# Patient Record
Sex: Female | Born: 1945
Health system: Southern US, Community
[De-identification: ages and names within clinical notes are randomized; demographics above are authoritative.]

## PROBLEM LIST (undated history)

## (undated) DIAGNOSIS — I10 Essential (primary) hypertension: Secondary | ICD-10-CM

## (undated) DIAGNOSIS — M1990 Unspecified osteoarthritis, unspecified site: Secondary | ICD-10-CM

## (undated) DIAGNOSIS — I4891 Unspecified atrial fibrillation: Secondary | ICD-10-CM

## (undated) DIAGNOSIS — E119 Type 2 diabetes mellitus without complications: Secondary | ICD-10-CM

## (undated) DIAGNOSIS — Z7901 Long term (current) use of anticoagulants: Secondary | ICD-10-CM

## (undated) HISTORY — PX: CATARACT EXTRACTION W/ INTRAOCULAR LENS  IMPLANT, BILATERAL: SHX1307

---

## 1998-11-29 ENCOUNTER — Other Ambulatory Visit: Admission: RE | Admit: 1998-11-29 | Discharge: 1998-11-29 | Payer: Self-pay | Admitting: Obstetrics & Gynecology

## 1999-12-05 ENCOUNTER — Other Ambulatory Visit: Admission: RE | Admit: 1999-12-05 | Discharge: 1999-12-05 | Payer: Self-pay | Admitting: Obstetrics & Gynecology

## 2000-06-01 ENCOUNTER — Encounter: Payer: Self-pay | Admitting: Obstetrics & Gynecology

## 2000-06-01 ENCOUNTER — Encounter: Admission: RE | Admit: 2000-06-01 | Discharge: 2000-06-01 | Payer: Self-pay | Admitting: Obstetrics & Gynecology

## 2001-02-15 ENCOUNTER — Other Ambulatory Visit: Admission: RE | Admit: 2001-02-15 | Discharge: 2001-02-15 | Payer: Self-pay | Admitting: Obstetrics & Gynecology

## 2001-12-07 ENCOUNTER — Encounter: Payer: Self-pay | Admitting: Obstetrics & Gynecology

## 2001-12-07 ENCOUNTER — Encounter: Admission: RE | Admit: 2001-12-07 | Discharge: 2001-12-07 | Payer: Self-pay | Admitting: Obstetrics & Gynecology

## 2002-02-21 ENCOUNTER — Other Ambulatory Visit: Admission: RE | Admit: 2002-02-21 | Discharge: 2002-02-21 | Payer: Self-pay | Admitting: Obstetrics & Gynecology

## 2002-12-19 ENCOUNTER — Encounter: Admission: RE | Admit: 2002-12-19 | Discharge: 2002-12-19 | Payer: Self-pay | Admitting: Obstetrics & Gynecology

## 2002-12-19 ENCOUNTER — Encounter: Payer: Self-pay | Admitting: Obstetrics & Gynecology

## 2003-11-27 ENCOUNTER — Other Ambulatory Visit: Admission: RE | Admit: 2003-11-27 | Discharge: 2003-11-27 | Payer: Self-pay | Admitting: Obstetrics & Gynecology

## 2004-01-12 ENCOUNTER — Ambulatory Visit (HOSPITAL_COMMUNITY): Admission: RE | Admit: 2004-01-12 | Discharge: 2004-01-12 | Payer: Self-pay | Admitting: Obstetrics & Gynecology

## 2005-01-17 ENCOUNTER — Ambulatory Visit (HOSPITAL_COMMUNITY): Admission: RE | Admit: 2005-01-17 | Discharge: 2005-01-17 | Payer: Self-pay | Admitting: Obstetrics & Gynecology

## 2005-01-17 ENCOUNTER — Other Ambulatory Visit: Admission: RE | Admit: 2005-01-17 | Discharge: 2005-01-17 | Payer: Self-pay | Admitting: Obstetrics & Gynecology

## 2006-02-06 ENCOUNTER — Ambulatory Visit (HOSPITAL_COMMUNITY): Admission: RE | Admit: 2006-02-06 | Discharge: 2006-02-06 | Payer: Self-pay | Admitting: Obstetrics & Gynecology

## 2007-03-05 ENCOUNTER — Ambulatory Visit (HOSPITAL_COMMUNITY): Admission: RE | Admit: 2007-03-05 | Discharge: 2007-03-05 | Payer: Self-pay | Admitting: Obstetrics & Gynecology

## 2008-03-21 ENCOUNTER — Ambulatory Visit (HOSPITAL_COMMUNITY): Admission: RE | Admit: 2008-03-21 | Discharge: 2008-03-21 | Payer: Self-pay | Admitting: Obstetrics & Gynecology

## 2009-03-23 ENCOUNTER — Ambulatory Visit (HOSPITAL_COMMUNITY): Admission: RE | Admit: 2009-03-23 | Discharge: 2009-03-23 | Payer: Self-pay | Admitting: Obstetrics & Gynecology

## 2010-04-01 ENCOUNTER — Ambulatory Visit (HOSPITAL_COMMUNITY): Admission: RE | Admit: 2010-04-01 | Discharge: 2010-04-01 | Payer: Self-pay | Admitting: Endocrinology

## 2010-04-12 ENCOUNTER — Encounter: Admission: RE | Admit: 2010-04-12 | Discharge: 2010-04-12 | Payer: Self-pay | Admitting: Endocrinology

## 2010-04-15 ENCOUNTER — Encounter: Admission: RE | Admit: 2010-04-15 | Discharge: 2010-04-15 | Payer: Self-pay | Admitting: Endocrinology

## 2010-10-08 ENCOUNTER — Encounter: Admission: RE | Admit: 2010-10-08 | Discharge: 2010-10-08 | Payer: Self-pay | Admitting: Endocrinology

## 2011-03-17 ENCOUNTER — Other Ambulatory Visit: Payer: Self-pay | Admitting: Endocrinology

## 2011-03-17 DIAGNOSIS — Z09 Encounter for follow-up examination after completed treatment for conditions other than malignant neoplasm: Secondary | ICD-10-CM

## 2011-05-20 ENCOUNTER — Ambulatory Visit
Admission: RE | Admit: 2011-05-20 | Discharge: 2011-05-20 | Disposition: A | Discharge status: Home / Self Care | Payer: BC Managed Care – PPO | Source: Ambulatory Visit | Attending: Endocrinology | Admitting: Endocrinology

## 2011-05-20 DIAGNOSIS — Z09 Encounter for follow-up examination after completed treatment for conditions other than malignant neoplasm: Secondary | ICD-10-CM

## 2012-04-20 ENCOUNTER — Other Ambulatory Visit: Payer: Self-pay | Admitting: Endocrinology

## 2012-04-20 DIAGNOSIS — Z1231 Encounter for screening mammogram for malignant neoplasm of breast: Secondary | ICD-10-CM

## 2012-06-11 ENCOUNTER — Ambulatory Visit
Admission: RE | Admit: 2012-06-11 | Discharge: 2012-06-11 | Disposition: A | Discharge status: Home / Self Care | Payer: BC Managed Care – PPO | Source: Ambulatory Visit | Attending: Endocrinology | Admitting: Endocrinology

## 2012-06-11 DIAGNOSIS — Z1231 Encounter for screening mammogram for malignant neoplasm of breast: Secondary | ICD-10-CM

## 2013-01-19 ENCOUNTER — Ambulatory Visit (INDEPENDENT_AMBULATORY_CARE_PROVIDER_SITE_OTHER): Payer: BC Managed Care – PPO | Admitting: Surgery

## 2013-01-20 ENCOUNTER — Ambulatory Visit (INDEPENDENT_AMBULATORY_CARE_PROVIDER_SITE_OTHER): Payer: Self-pay | Admitting: General Surgery

## 2013-01-21 ENCOUNTER — Telehealth (INDEPENDENT_AMBULATORY_CARE_PROVIDER_SITE_OTHER): Payer: Self-pay

## 2013-01-21 NOTE — Telephone Encounter (Signed)
Called pt to let her know of her new appt date and time: Monday, March 3rd @ 150p.  Pt stated that she called our office on Wed, Feb 12 and canceled her appt with [facility], and that she had scheduled with another office.  NONE OF THIS WAS NOTED IN HER CHART.

## 2013-01-21 NOTE — Telephone Encounter (Signed)
Spoke with pt about her r/s'd appt date and time: Tuesday March 4th @310p 

## 2013-01-24 ENCOUNTER — Encounter (INDEPENDENT_AMBULATORY_CARE_PROVIDER_SITE_OTHER): Payer: Self-pay | Admitting: General Surgery

## 2013-02-07 ENCOUNTER — Ambulatory Visit (INDEPENDENT_AMBULATORY_CARE_PROVIDER_SITE_OTHER): Payer: BC Managed Care – PPO | Admitting: Surgery

## 2013-06-22 ENCOUNTER — Other Ambulatory Visit: Payer: Self-pay

## 2013-06-22 DIAGNOSIS — Z1231 Encounter for screening mammogram for malignant neoplasm of breast: Secondary | ICD-10-CM

## 2013-06-26 ENCOUNTER — Other Ambulatory Visit: Payer: Self-pay | Admitting: Orthopedic Surgery

## 2013-06-26 MED ORDER — BUPIVACAINE LIPOSOME 1.3 % IJ SUSP: 20.0000 mL | Freq: Once | INTRAMUSCULAR | Status: DC

## 2013-06-26 MED ORDER — DEXAMETHASONE SODIUM PHOSPHATE 10 MG/ML IJ SOLN: 10.0000 mg | Freq: Once | INTRAMUSCULAR | Status: DC

## 2013-06-26 NOTE — Progress Notes (Signed)
Preoperative surgical orders have been place into the [facility] hospital system for Kelly [name] on 06/26/2013, 10:26 AM  by Patrica Duel for surgery on 07/07/13.  Preop Total Hip orders including Experel Injecion, PO Tylenol, and IV Decadron as long as there are no contraindications to the above medications. Avel Peace, PA-C

## 2013-06-27 ENCOUNTER — Encounter (HOSPITAL_COMMUNITY): Payer: Self-pay | Admitting: Pharmacy Technician

## 2013-06-29 NOTE — Patient Instructions (Signed)
Kelly [name]  06/29/2013   Your procedure is scheduled on:  07/07/13                Surgery 315pm-515pm  Report to [facility] at    1245pm.  Call this number if you have problems the morning of surgery: [phone]   Remember:   Do not eat food after midnite.              May have clear liquids until 0830am then npo.    Take these medicines the morning of surgery with A SIP OF WATER:    Do not wear jewelry, make-up or nail polish.  Do not wear lotions, powders, or perfumes  Do not shave 48 hours prior to surgery  Do not bring valuables to the hospital.  Contacts, dentures or bridgework may not be worn into surgery.  Leave suitcase in the car. After surgery it may be brought to your room.  For patients admitted to the hospital, checkout time is 11:00 AM the day of  discharge.   SEE CHG INSTRUCTION SHEET    Please read over the following fact sheets that you were given: MRSA Information, coughing and deep breathing exercises, leg exercises, Blood Transfusion Fact sheet, Incentive Spirometry Fact Sheet                Failure to comply with these instructions may result in cancellation of your surgery.                Patient Signature ____________________________              Nurse Signature _____________________________

## 2013-06-30 ENCOUNTER — Other Ambulatory Visit: Payer: Self-pay | Admitting: Orthopedic Surgery

## 2013-06-30 ENCOUNTER — Ambulatory Visit (HOSPITAL_COMMUNITY)
Admission: RE | Admit: 2013-06-30 | Discharge: 2013-06-30 | Disposition: A | Discharge status: Home / Self Care | Payer: BC Managed Care – PPO | Source: Ambulatory Visit | Attending: Orthopedic Surgery | Admitting: Orthopedic Surgery

## 2013-06-30 ENCOUNTER — Encounter (HOSPITAL_COMMUNITY): Payer: Self-pay

## 2013-06-30 ENCOUNTER — Encounter (HOSPITAL_COMMUNITY)
Admission: RE | Admit: 2013-06-30 | Discharge: 2013-06-30 | Disposition: A | Discharge status: Home / Self Care | Payer: BC Managed Care – PPO | Source: Ambulatory Visit | Attending: Orthopedic Surgery | Admitting: Orthopedic Surgery

## 2013-06-30 DIAGNOSIS — M1990 Unspecified osteoarthritis, unspecified site: Secondary | ICD-10-CM | POA: Insufficient documentation

## 2013-06-30 DIAGNOSIS — Z01812 Encounter for preprocedural laboratory examination: Secondary | ICD-10-CM | POA: Insufficient documentation

## 2013-06-30 DIAGNOSIS — I10 Essential (primary) hypertension: Secondary | ICD-10-CM | POA: Insufficient documentation

## 2013-06-30 DIAGNOSIS — Z01818 Encounter for other preprocedural examination: Secondary | ICD-10-CM | POA: Insufficient documentation

## 2013-06-30 DIAGNOSIS — Z0183 Encounter for blood typing: Secondary | ICD-10-CM | POA: Insufficient documentation

## 2013-06-30 DIAGNOSIS — E119 Type 2 diabetes mellitus without complications: Secondary | ICD-10-CM | POA: Insufficient documentation

## 2013-06-30 HISTORY — DX: Unspecified osteoarthritis, unspecified site: M19.90

## 2013-06-30 LAB — URINALYSIS, ROUTINE W REFLEX MICROSCOPIC
Bilirubin Urine: NEGATIVE
Hgb urine dipstick: NEGATIVE
Protein, ur: NEGATIVE mg/dL
Urobilinogen, UA: 1 mg/dL (ref 0.0–1.0)

## 2013-06-30 LAB — ABO/RH: ABO/RH(D): O POS

## 2013-06-30 MED ORDER — DEXAMETHASONE SODIUM PHOSPHATE 10 MG/ML IJ SOLN: 10.0000 mg | Freq: Once | INTRAMUSCULAR | Status: DC

## 2013-06-30 NOTE — Progress Notes (Signed)
EKG 06/16/13 on chart  CBC, CMP, PT done 06/16/13 on chart  Office visit note 06/21/13 on chart from Dr Evlyn Kanner

## 2013-07-04 ENCOUNTER — Ambulatory Visit
Admission: RE | Admit: 2013-07-04 | Discharge: 2013-07-04 | Disposition: A | Discharge status: Home / Self Care | Payer: BC Managed Care – PPO | Source: Ambulatory Visit

## 2013-07-04 DIAGNOSIS — Z1231 Encounter for screening mammogram for malignant neoplasm of breast: Secondary | ICD-10-CM

## 2013-07-06 ENCOUNTER — Other Ambulatory Visit: Payer: Self-pay | Admitting: Orthopedic Surgery

## 2013-07-06 NOTE — H&P (Signed)
Kelly [name]  DOB: 10/29/1946 Married / Language: English / Race: White Female  Date of Admission:  07/07/2013  Chief Complaint:  Right Hip Pain  History of Present Illness The patient is a 67 year old female who comes in for a preoperative History and Physical. The patient is scheduled for a right total hip arthroplasty to be performed by Dr. Frank V. Aluisio, MD at [facility] on 07-07-13. The patient is a 67 year old female who presents with a hip problem. The patient was seen for a second opinion.The patient reports right hip, right lateral hip and right posterior hip problems including pain, popping and snapping symptoms that have been present for 4 year(s). The symptoms began without any known injury. Symptoms reported include hip pain and popping The patient reports symptoms radiating to the: right thigh. The patient describes the hip problem as sharp, dull, aching, throbbing, grinding, stabbing and shooting. Onset of symptoms was gradual.The symptoms are described as moderate in severity.The patient feels as if their symptoms are does feel they are worsening. Symptoms are relieved by nonsteroidal anti-inflammatory drugs. Current treatment includes nonsteroidal anti-inflammatory drugs. Unfortunately her hip has gotten progressively worse over time. When it began four years ago she noted pain in her buttock area when she was down in a shopping mall in [place]. She had a hard time standing after that and had some dull pain intermittently for a few years after that. Unfortunately since February of this year she has had a marked increase in pain and dysfunction. She had been seen over at [facility] by Dr. Newton and was felt that problem initially was coming from her back. Dr. Lucey then saw her and noted that the problem was coming from the hip. She has had intra-articular hip injection which provided her a short term benefit. She feels as though the hip is  definitely progressing and is definitely limiting what she can and can not do. She is at a stage where she is ready to get this hip fixed. Her left hip does not hurt. There is no family history of hip arthritis. They have been treated conservatively in the past for the above stated problem and despite conservative measures, they continue to have progressive pain and severe functional limitations and dysfunction. They have failed non-operative management including home exercise, medications, and injections. It is felt that they would benefit from undergoing total joint replacement. Risks and benefits of the procedure have been discussed with the patient and they elect to proceed with surgery. There are no active contraindications to surgery such as ongoing infection or rapidly progressive neurological disease.   Allergies No Known Drug Allergies. 05/25/2013   Family History First Degree Relatives. reported Diabetes Mellitus. grandmother fathers side Chronic Obstructive Lung Disease. mother   Social History Never smoker Marital status. married Living situation. live with spouse Tobacco use. never smoker Pain Contract. no Number of flights of stairs before winded. 2-3 Current work status. working full time Children. 2 Alcohol use. never consumed alcohol Illicit drug use. no Drug/Alcohol Rehab (Previously). no Drug/Alcohol Rehab (Currently). no Post-Surgical Plans. Home   Medication History Byetta 10 MCG Pen (10MCG/0.04ML Solution, Subcutaneous) Active. Ramipril (10MG Capsule, Oral) Active. Triamcinolone Acetonide (0.1% Cream, External) Active. Vitamin D (Ergocalciferol) (50000UNIT Capsule, Oral) Active. Aspirin (81MG Tablet, 1 (one) Oral) Active. MetFORMIN HCl (850MG Tablet, Oral) Active. (15-850mg) Simvastatin (40MG Tablet, Oral) Active. Vitamin D (400UNIT Capsule, 1 (one) Oral) Active. (50000 iu) I CAPS AREDS FORMULA Active. Turmeric Curcumin (500MG   Capsule,  Oral) Active.   Past Surgical History No pertinent past surgical history   Medical History Hypercholesterolemia Osteoarthritis Osteoporosis Diabetes Mellitus, Type II Heart murmur High blood pressure Cholelithiasis. Asymptomatic   Review of Systems General:Not Present- Chills, Fever, Night Sweats, Fatigue, Weight Gain, Weight Loss and Memory Loss. Skin:Not Present- Hives, Itching, Rash, Eczema and Lesions. HEENT:Not Present- Tinnitus, Headache, Double Vision, Visual Loss, Hearing Loss and Dentures. Respiratory:Not Present- Shortness of breath with exertion, Shortness of breath at rest, Allergies, Coughing up blood and Chronic Cough. Cardiovascular:Not Present- Chest Pain, Racing/skipping heartbeats, Difficulty Breathing Lying Down, Murmur, Swelling and Palpitations. Gastrointestinal:Not Present- Bloody Stool, Heartburn, Abdominal Pain, Vomiting, Nausea, Constipation, Diarrhea, Difficulty Swallowing, Jaundice and Loss of appetitie. Female Genitourinary:Not Present- Blood in Urine, Urinary frequency, Weak urinary stream, Discharge, Flank Pain, Incontinence, Painful Urination, Urgency, Urinary Retention and Urinating at Night. Musculoskeletal:Present- Joint Pain. Not Present- Muscle Weakness, Muscle Pain, Joint Swelling, Back Pain, Morning Stiffness and Spasms. Neurological:Not Present- Tremor, Dizziness, Blackout spells, Paralysis, Difficulty with balance and Weakness. Psychiatric:Not Present- Insomnia.   Vitals Weight: 258 lb Height: 61.5 in Body Surface Area: 2.25 m Body Mass Index: 47.96 kg/m Pulse: 64 (Regular) Resp.: 14 (Unlabored) BP: 138/80 (Sitting, Left Arm, Standard)    Physical Exam The physical exam findings are as follows:  Note: Patient is a 67 year old female with continued hip pain. Patient is accompanied today by her husband.   General Mental Status - Alert, cooperative and good historian. General Appearance- pleasant.  Not in acute distress. Orientation- Oriented X3. Build & Nutrition- Well nourished and Well developed.   Head and Neck Head- normocephalic, atraumatic . Neck Global Assessment- bruit auscultated on the left and supple. no bruit auscultated on the right. Carotid Arteries- Left- bruit (faint).   Eye Pupil- Bilateral- Regular and Round. Motion- Bilateral- EOMI.   Chest and Lung Exam Auscultation: Breath sounds:- clear at anterior chest wall and - clear at posterior chest wall. Adventitious sounds:- No Adventitious sounds.   Cardiovascular Auscultation:Rhythm- Regular rate and rhythm. Heart Sounds- S1 WNL and S2 WNL. Murmurs & Other Heart Sounds: Murmur 1:Location- Aortic Area and Pulmonic Area. Timing- Holosystolic. Grade- II/VI. Character- Holosystolic.   Abdomen Inspection:Contour- Generalized moderate distention. Palpation/Percussion:Tenderness- Abdomen is non-tender to palpation. Rigidity (guarding)- Abdomen is soft. Auscultation:Auscultation of the abdomen reveals - Bowel sounds normal.   Female Genitourinary Not done, not pertinent to present illness  Musculoskeletal Well developed moderately obese female, most of her obesity is around the trunk and hips. Her left hip shows normal range of motion with no discomfort. Right hip flexion to about 90, no internal or external rotation, no abduction or adduction. She is slightly tender over the right greater trochanter. Her knee exam is normal. Pulses, sensation and motor are intact. That leg is about 1/2 inch short compared to the left lower extremity.  RADIOGRAPHS: AP pelvis and lateral of the right hip show that she has severe endstage hip arthritis with significant erosion of the femoral head and superior acetabulum. The hip is riding about 1/2 inch high compared to the left hip which looks normal.  Assessment & Plan Osteoarthritis, Hip (715.35) Impression: Right Hip  Note: Plan is for a  Right Total Hip Replacement by Dr. Aluisio.  Plan is to go home following the hospital stay.  PCP - Dr. Stephen South - Patient has been seen preoperatively and felt to be stable for surgery.  The patient does not have any contraindications and will recieve TXA (tranexamic acid) prior to surgery.    Signed electronically by Alexzandrew L Perkins, III PA-C  

## 2013-07-07 ENCOUNTER — Encounter (HOSPITAL_COMMUNITY): Payer: Self-pay | Admitting: *Deleted

## 2013-07-07 ENCOUNTER — Inpatient Hospital Stay (HOSPITAL_COMMUNITY): Payer: BC Managed Care – PPO | Admitting: Certified Registered Nurse Anesthetist

## 2013-07-07 ENCOUNTER — Inpatient Hospital Stay (HOSPITAL_COMMUNITY)
Admission: RE | Admit: 2013-07-07 | Discharge: 2013-07-09 | DRG: 818 | Disposition: A | Discharge status: Home Health | Payer: BC Managed Care – PPO | Source: Ambulatory Visit | Attending: Orthopedic Surgery | Admitting: Orthopedic Surgery

## 2013-07-07 ENCOUNTER — Encounter (HOSPITAL_COMMUNITY): Payer: Self-pay | Admitting: Certified Registered Nurse Anesthetist

## 2013-07-07 ENCOUNTER — Inpatient Hospital Stay (HOSPITAL_COMMUNITY): Payer: BC Managed Care – PPO

## 2013-07-07 ENCOUNTER — Encounter (HOSPITAL_COMMUNITY)
Admission: RE | Disposition: A | Discharge status: Home Health | Payer: Self-pay | Source: Ambulatory Visit | Attending: Orthopedic Surgery

## 2013-07-07 DIAGNOSIS — E119 Type 2 diabetes mellitus without complications: Secondary | ICD-10-CM | POA: Diagnosis present

## 2013-07-07 DIAGNOSIS — M169 Osteoarthritis of hip, unspecified: Secondary | ICD-10-CM

## 2013-07-07 DIAGNOSIS — M810 Age-related osteoporosis without current pathological fracture: Secondary | ICD-10-CM | POA: Diagnosis present

## 2013-07-07 DIAGNOSIS — E7800 Pure hypercholesterolemia, unspecified: Secondary | ICD-10-CM | POA: Diagnosis present

## 2013-07-07 DIAGNOSIS — I10 Essential (primary) hypertension: Secondary | ICD-10-CM | POA: Diagnosis present

## 2013-07-07 DIAGNOSIS — Z96649 Presence of unspecified artificial hip joint: Secondary | ICD-10-CM

## 2013-07-07 DIAGNOSIS — M1610 Unilateral primary osteoarthritis, unspecified hip: Principal | ICD-10-CM | POA: Diagnosis present

## 2013-07-07 DIAGNOSIS — Z6841 Body Mass Index (BMI) 40.0 and over, adult: Secondary | ICD-10-CM

## 2013-07-07 HISTORY — PX: TOTAL HIP ARTHROPLASTY: SHX124

## 2013-07-07 LAB — GLUCOSE, CAPILLARY: Glucose-Capillary: 100 mg/dL — ABNORMAL HIGH (ref 70–99)

## 2013-07-07 LAB — TYPE AND SCREEN
ABO/RH(D): O POS
Antibody Screen: NEGATIVE

## 2013-07-07 SURGERY — ARTHROPLASTY, HIP, TOTAL,POSTERIOR APPROACH
Anesthesia: Spinal | Site: Hip | Laterality: Right | Wound class: Clean

## 2013-07-07 MED ORDER — LIDOCAINE HCL (CARDIAC) 20 MG/ML IV SOLN
INTRAVENOUS | Status: DC | PRN
  Administered 2013-07-07: 100 mg via INTRAVENOUS

## 2013-07-07 MED ORDER — LACTATED RINGERS IV SOLN
INTRAVENOUS | Status: DC
  Administered 2013-07-07: 1000 mL via INTRAVENOUS

## 2013-07-07 MED ORDER — ONDANSETRON HCL 4 MG/2ML IJ SOLN
4.0000 mg | Freq: Four times a day (QID) | INTRAMUSCULAR | Status: DC | PRN
  Administered 2013-07-08: 4 mg via INTRAVENOUS
  Filled 2013-07-07: qty 2

## 2013-07-07 MED ORDER — MORPHINE SULFATE 2 MG/ML IJ SOLN
1.0000 mg | INTRAMUSCULAR | Status: DC | PRN
  Administered 2013-07-07 – 2013-07-08 (×6): 2 mg via INTRAVENOUS
  Filled 2013-07-07 (×6): qty 1

## 2013-07-07 MED ORDER — CEFAZOLIN SODIUM-DEXTROSE 2-3 GM-% IV SOLR
2.0000 g | INTRAVENOUS | Status: AC
  Administered 2013-07-07: 2 g via INTRAVENOUS

## 2013-07-07 MED ORDER — SODIUM CHLORIDE 0.9 % IJ SOLN
INTRAMUSCULAR | Status: DC | PRN
  Administered 2013-07-07: 50 mL

## 2013-07-07 MED ORDER — FLEET ENEMA 7-19 GM/118ML RE ENEM: 1.0000 | ENEMA | Freq: Once | RECTAL | Status: AC | PRN

## 2013-07-07 MED ORDER — HYDROMORPHONE HCL PF 1 MG/ML IJ SOLN
0.2500 mg | INTRAMUSCULAR | Status: DC | PRN
  Administered 2013-07-07 (×2): 0.5 mg via INTRAVENOUS

## 2013-07-07 MED ORDER — INSULIN ASPART 100 UNIT/ML ~~LOC~~ SOLN
0.0000 [IU] | Freq: Three times a day (TID) | SUBCUTANEOUS | Status: DC
  Administered 2013-07-08: 3 [IU] via SUBCUTANEOUS
  Administered 2013-07-08 – 2013-07-09 (×3): 2 [IU] via SUBCUTANEOUS

## 2013-07-07 MED ORDER — SODIUM CHLORIDE 0.9 % IV SOLN
INTRAVENOUS | Status: DC
  Administered 2013-07-07: 22:00:00 via INTRAVENOUS

## 2013-07-07 MED ORDER — BISACODYL 10 MG RE SUPP: 10.0000 mg | Freq: Every day | RECTAL | Status: DC | PRN

## 2013-07-07 MED ORDER — ACETAMINOPHEN 500 MG PO TABS
1000.0000 mg | ORAL_TABLET | Freq: Four times a day (QID) | ORAL | Status: AC
  Administered 2013-07-07 – 2013-07-08 (×4): 1000 mg via ORAL
  Filled 2013-07-07 (×4): qty 2

## 2013-07-07 MED ORDER — BUPIVACAINE HCL (PF) 0.5 % IJ SOLN
INTRAMUSCULAR | Status: DC | PRN
  Administered 2013-07-07: 3 mL

## 2013-07-07 MED ORDER — PHENOL 1.4 % MT LIQD: 1.0000 | OROMUCOSAL | Status: DC | PRN

## 2013-07-07 MED ORDER — BUPIVACAINE LIPOSOME 1.3 % IJ SUSP
20.0000 mL | Freq: Once | INTRAMUSCULAR | Status: DC
  Filled 2013-07-07: qty 20

## 2013-07-07 MED ORDER — FENTANYL CITRATE 0.05 MG/ML IJ SOLN: 25.0000 ug | INTRAMUSCULAR | Status: DC | PRN

## 2013-07-07 MED ORDER — PIOGLITAZONE HCL-METFORMIN HCL 15-850 MG PO TABS: 1.0000 | ORAL_TABLET | Freq: Every evening | ORAL | Status: DC

## 2013-07-07 MED ORDER — MEPERIDINE HCL 50 MG/ML IJ SOLN
6.2500 mg | INTRAMUSCULAR | Status: DC | PRN
  Administered 2013-07-07 (×2): 6.25 mg via INTRAVENOUS

## 2013-07-07 MED ORDER — SODIUM CHLORIDE 0.9 % IR SOLN
Status: DC | PRN
  Administered 2013-07-07: 1000 mL

## 2013-07-07 MED ORDER — METHOCARBAMOL 100 MG/ML IJ SOLN
500.0000 mg | Freq: Four times a day (QID) | INTRAVENOUS | Status: DC | PRN
  Administered 2013-07-07: 500 mg via INTRAVENOUS
  Filled 2013-07-07: qty 5

## 2013-07-07 MED ORDER — SIMVASTATIN 40 MG PO TABS
40.0000 mg | ORAL_TABLET | Freq: Every evening | ORAL | Status: DC
  Administered 2013-07-07 – 2013-07-08 (×2): 40 mg via ORAL
  Filled 2013-07-07 (×3): qty 1

## 2013-07-07 MED ORDER — TRAMADOL HCL 50 MG PO TABS
50.0000 mg | ORAL_TABLET | Freq: Four times a day (QID) | ORAL | Status: DC | PRN
  Administered 2013-07-08: 100 mg via ORAL
  Filled 2013-07-07 (×2): qty 2

## 2013-07-07 MED ORDER — PROMETHAZINE HCL 25 MG/ML IJ SOLN: 6.2500 mg | INTRAMUSCULAR | Status: DC | PRN

## 2013-07-07 MED ORDER — OXYCODONE HCL 5 MG PO TABS
5.0000 mg | ORAL_TABLET | ORAL | Status: DC | PRN
Start: 2013-07-07 — End: 2013-07-09
  Administered 2013-07-07 (×2): 5 mg via ORAL
  Administered 2013-07-08 (×5): 10 mg via ORAL
  Administered 2013-07-09: 5 mg via ORAL
  Filled 2013-07-07 (×2): qty 1
  Filled 2013-07-07 (×6): qty 2
  Filled 2013-07-07: qty 1

## 2013-07-07 MED ORDER — ONDANSETRON HCL 4 MG PO TABS
4.0000 mg | ORAL_TABLET | Freq: Four times a day (QID) | ORAL | Status: DC | PRN
  Administered 2013-07-09: 4 mg via ORAL
  Filled 2013-07-07: qty 1

## 2013-07-07 MED ORDER — ACETAMINOPHEN 500 MG PO TABS
1000.0000 mg | ORAL_TABLET | Freq: Once | ORAL | Status: AC
  Administered 2013-07-07: 1000 mg via ORAL
  Filled 2013-07-07: qty 2

## 2013-07-07 MED ORDER — PROPOFOL INFUSION 10 MG/ML OPTIME
INTRAVENOUS | Status: DC | PRN
  Administered 2013-07-07: 160 ug/kg/min via INTRAVENOUS

## 2013-07-07 MED ORDER — PHENYLEPHRINE HCL 10 MG/ML IJ SOLN
INTRAMUSCULAR | Status: DC | PRN
  Administered 2013-07-07: 80 ug via INTRAVENOUS
  Administered 2013-07-07: 40 ug via INTRAVENOUS

## 2013-07-07 MED ORDER — MIDAZOLAM HCL 5 MG/5ML IJ SOLN
INTRAMUSCULAR | Status: DC | PRN
  Administered 2013-07-07 (×2): 2 mg via INTRAVENOUS

## 2013-07-07 MED ORDER — METOCLOPRAMIDE HCL 5 MG/ML IJ SOLN: 5.0000 mg | Freq: Three times a day (TID) | INTRAMUSCULAR | Status: DC | PRN

## 2013-07-07 MED ORDER — POLYETHYLENE GLYCOL 3350 17 G PO PACK: 17.0000 g | PACK | Freq: Every day | ORAL | Status: DC | PRN

## 2013-07-07 MED ORDER — DEXAMETHASONE SODIUM PHOSPHATE 10 MG/ML IJ SOLN: 10.0000 mg | Freq: Once | INTRAMUSCULAR | Status: DC

## 2013-07-07 MED ORDER — ONDANSETRON HCL 4 MG/2ML IJ SOLN
INTRAMUSCULAR | Status: DC | PRN
  Administered 2013-07-07: 4 mg via INTRAVENOUS

## 2013-07-07 MED ORDER — METHOCARBAMOL 500 MG PO TABS
500.0000 mg | ORAL_TABLET | Freq: Four times a day (QID) | ORAL | Status: DC | PRN
  Administered 2013-07-08: 500 mg via ORAL
  Filled 2013-07-07: qty 1

## 2013-07-07 MED ORDER — CEFAZOLIN SODIUM-DEXTROSE 2-3 GM-% IV SOLR
2.0000 g | Freq: Four times a day (QID) | INTRAVENOUS | Status: AC
  Administered 2013-07-07 – 2013-07-08 (×2): 2 g via INTRAVENOUS
  Filled 2013-07-07 (×2): qty 50

## 2013-07-07 MED ORDER — BUPIVACAINE HCL (PF) 0.25 % IJ SOLN
INTRAMUSCULAR | Status: DC | PRN
  Administered 2013-07-07: 20 mL

## 2013-07-07 MED ORDER — BUPIVACAINE LIPOSOME 1.3 % IJ SUSP
INTRAMUSCULAR | Status: DC | PRN
  Administered 2013-07-07: 20 mL

## 2013-07-07 MED ORDER — MENTHOL 3 MG MT LOZG: 1.0000 | LOZENGE | OROMUCOSAL | Status: DC | PRN

## 2013-07-07 MED ORDER — EXENATIDE 10 MCG/0.04ML ~~LOC~~ SOPN
10.0000 ug | PEN_INJECTOR | Freq: Every day | SUBCUTANEOUS | Status: DC
  Administered 2013-07-08 – 2013-07-09 (×2): 10 ug via SUBCUTANEOUS

## 2013-07-07 MED ORDER — TRANEXAMIC ACID 100 MG/ML IV SOLN
1000.0000 mg | INTRAVENOUS | Status: AC
  Administered 2013-07-07: 1000 mg via INTRAVENOUS
  Filled 2013-07-07: qty 10

## 2013-07-07 MED ORDER — DIPHENHYDRAMINE HCL 12.5 MG/5ML PO ELIX: 12.5000 mg | ORAL_SOLUTION | ORAL | Status: DC | PRN

## 2013-07-07 MED ORDER — SODIUM CHLORIDE 0.9 % IV SOLN: INTRAVENOUS | Status: DC

## 2013-07-07 MED ORDER — METOCLOPRAMIDE HCL 10 MG PO TABS: 5.0000 mg | ORAL_TABLET | Freq: Three times a day (TID) | ORAL | Status: DC | PRN

## 2013-07-07 MED ORDER — RIVAROXABAN 10 MG PO TABS
10.0000 mg | ORAL_TABLET | Freq: Every day | ORAL | Status: DC
  Administered 2013-07-08 – 2013-07-09 (×2): 10 mg via ORAL
  Filled 2013-07-07 (×3): qty 1

## 2013-07-07 MED ORDER — RAMIPRIL 10 MG PO CAPS
20.0000 mg | ORAL_CAPSULE | Freq: Every evening | ORAL | Status: DC
  Administered 2013-07-07 – 2013-07-08 (×2): 20 mg via ORAL
  Filled 2013-07-07 (×3): qty 2

## 2013-07-07 MED ORDER — DOCUSATE SODIUM 100 MG PO CAPS
100.0000 mg | ORAL_CAPSULE | Freq: Two times a day (BID) | ORAL | Status: DC
  Administered 2013-07-07 – 2013-07-09 (×3): 100 mg via ORAL

## 2013-07-07 MED ORDER — CHLORHEXIDINE GLUCONATE 4 % EX LIQD
60.0000 mL | Freq: Once | CUTANEOUS | Status: DC
  Filled 2013-07-07: qty 60

## 2013-07-07 MED ORDER — PIOGLITAZONE HCL 15 MG PO TABS
15.0000 mg | ORAL_TABLET | Freq: Every day | ORAL | Status: DC
  Administered 2013-07-08 – 2013-07-09 (×2): 15 mg via ORAL
  Filled 2013-07-07 (×3): qty 1

## 2013-07-07 MED ORDER — PHENYLEPHRINE HCL 10 MG/ML IJ SOLN
10.0000 mg | INTRAVENOUS | Status: DC | PRN
  Administered 2013-07-07: 50 ug/min via INTRAVENOUS

## 2013-07-07 MED ORDER — METFORMIN HCL ER 750 MG PO TB24
750.0000 mg | ORAL_TABLET | Freq: Every day | ORAL | Status: DC
  Administered 2013-07-08 – 2013-07-09 (×2): 750 mg via ORAL
  Filled 2013-07-07 (×3): qty 1

## 2013-07-07 MED ORDER — GLYCOPYRROLATE 0.2 MG/ML IJ SOLN
INTRAMUSCULAR | Status: DC | PRN
  Administered 2013-07-07: 0.2 mg via INTRAVENOUS

## 2013-07-07 MED ORDER — EPHEDRINE SULFATE 50 MG/ML IJ SOLN
INTRAMUSCULAR | Status: DC | PRN
  Administered 2013-07-07: 10 mg via INTRAVENOUS

## 2013-07-07 MED ORDER — DEXAMETHASONE SODIUM PHOSPHATE 10 MG/ML IJ SOLN
INTRAMUSCULAR | Status: DC | PRN
  Administered 2013-07-07: 10 mg via INTRAVENOUS

## 2013-07-07 SURGICAL SUPPLY — 61 items
BAG ZIPLOCK 12X15 (MISCELLANEOUS) IMPLANT
BIT DRILL 2.8X128 (BIT) ×2 IMPLANT
BLADE EXTENDED COATED 6.5IN (ELECTRODE) ×2 IMPLANT
BLADE SAW SAG 73X25 THK (BLADE) ×1
BLADE SAW SGTL 73X25 THK (BLADE) ×1 IMPLANT
CAPT HIP PINN CUP W/POLY LNR ×2 IMPLANT
CAPT HIP PRIMARY STEM/CERM HD ×2 IMPLANT
CEMENT BONE DEPUY (Cement) ×4 IMPLANT
CEMENT KYPHON C01A KIT/MIXER (Cement) ×2 IMPLANT
CEMENT RESTRICTOR DEPUY SZ 4 (Cement) ×2 IMPLANT
CLOTH BEACON ORANGE TIMEOUT ST (SAFETY) ×2 IMPLANT
DECANTER SPIKE VIAL GLASS SM (MISCELLANEOUS) ×2 IMPLANT
DRAPE INCISE IOBAN 66X45 STRL (DRAPES) ×4 IMPLANT
DRAPE ORTHO SPLIT 77X108 STRL (DRAPES) ×4
DRAPE POUCH INSTRU U-SHP 10X18 (DRAPES) ×2 IMPLANT
DRAPE SURG ORHT 6 SPLT 77X108 (DRAPES) ×2 IMPLANT
DRAPE U-SHAPE 47X51 STRL (DRAPES) ×2 IMPLANT
DRSG ADAPTIC 3X8 NADH LF (GAUZE/BANDAGES/DRESSINGS) ×2 IMPLANT
DRSG MEPILEX BORDER 4X12 (GAUZE/BANDAGES/DRESSINGS) ×2 IMPLANT
DRSG MEPILEX BORDER 4X4 (GAUZE/BANDAGES/DRESSINGS) IMPLANT
DRSG MEPILEX BORDER 4X8 (GAUZE/BANDAGES/DRESSINGS) IMPLANT
DURAPREP 26ML APPLICATOR (WOUND CARE) ×2 IMPLANT
ELECT REM PT RETURN 9FT ADLT (ELECTROSURGICAL) ×2
ELECTRODE REM PT RTRN 9FT ADLT (ELECTROSURGICAL) ×1 IMPLANT
EVACUATOR 1/8 PVC DRAIN (DRAIN) ×2 IMPLANT
FACESHIELD LNG OPTICON STERILE (SAFETY) ×8 IMPLANT
GLOVE BIO SURGEON STRL SZ7.5 (GLOVE) ×2 IMPLANT
GLOVE BIO SURGEON STRL SZ8 (GLOVE) ×2 IMPLANT
GLOVE BIOGEL PI IND STRL 8 (GLOVE) ×2 IMPLANT
GLOVE BIOGEL PI INDICATOR 8 (GLOVE) ×2
GLOVE SURG SS PI 6.5 STRL IVOR (GLOVE) ×4 IMPLANT
GLOVE SURG SS PI 7.5 STRL IVOR (GLOVE) ×2 IMPLANT
GLOVE SURG SS PI 8.5 STRL IVOR (GLOVE) ×1
GLOVE SURG SS PI 8.5 STRL STRW (GLOVE) ×1 IMPLANT
GOWN STRL NON-REIN LRG LVL3 (GOWN DISPOSABLE) ×4 IMPLANT
GOWN STRL REIN XL XLG (GOWN DISPOSABLE) ×4 IMPLANT
IMMOBILIZER KNEE 20 (SOFTGOODS)
IMMOBILIZER KNEE 20 THIGH 36 (SOFTGOODS) IMPLANT
KIT BASIN OR (CUSTOM PROCEDURE TRAY) ×2 IMPLANT
MANIFOLD NEPTUNE II (INSTRUMENTS) ×2 IMPLANT
NDL SAFETY ECLIPSE 18X1.5 (NEEDLE) ×1 IMPLANT
NEEDLE HYPO 18GX1.5 SHARP (NEEDLE) ×2
NEEDLE HYPO 22GX1.5 SAFETY (NEEDLE) ×2 IMPLANT
NS IRRIG 1000ML POUR BTL (IV SOLUTION) ×2 IMPLANT
PACK TOTAL JOINT (CUSTOM PROCEDURE TRAY) ×2 IMPLANT
PASSER SUT SWANSON 36MM LOOP (INSTRUMENTS) ×2 IMPLANT
POSITIONER SURGICAL ARM (MISCELLANEOUS) ×2 IMPLANT
PRESSURIZER FEMORAL UNIV (MISCELLANEOUS) ×2 IMPLANT
SPONGE GAUZE 4X4 12PLY (GAUZE/BANDAGES/DRESSINGS) ×2 IMPLANT
STRIP CLOSURE SKIN 1/2X4 (GAUZE/BANDAGES/DRESSINGS) ×4 IMPLANT
SUT ETHIBOND NAB CT1 #1 30IN (SUTURE) ×4 IMPLANT
SUT MNCRL AB 4-0 PS2 18 (SUTURE) ×2 IMPLANT
SUT VIC AB 2-0 CT1 27 (SUTURE) ×6
SUT VIC AB 2-0 CT1 TAPERPNT 27 (SUTURE) ×3 IMPLANT
SUT VLOC 180 0 24IN GS25 (SUTURE) ×6 IMPLANT
SYR 20CC LL (SYRINGE) ×2 IMPLANT
SYR 50ML LL SCALE MARK (SYRINGE) ×2 IMPLANT
TOWEL OR 17X26 10 PK STRL BLUE (TOWEL DISPOSABLE) ×4 IMPLANT
TOWEL OR NON WOVEN STRL DISP B (DISPOSABLE) ×2 IMPLANT
TRAY FOLEY CATH 14FRSI W/METER (CATHETERS) ×2 IMPLANT
WATER STERILE IRR 1500ML POUR (IV SOLUTION) ×2 IMPLANT

## 2013-07-07 NOTE — Interval H&P Note (Signed)
History and Physical Interval Note:  07/07/2013 3:16 PM  Kelly [name]  has presented today for surgery, with the diagnosis of Osteoarthritis of the Right Hip  The various methods of treatment have been discussed with the patient and family. After consideration of risks, benefits and other options for treatment, the patient has consented to  Procedure(s): RIGHT TOTAL HIP ARTHROPLASTY (Right) as a surgical intervention .  The patient's history has been reviewed, patient examined, no change in status, stable for surgery.  I have reviewed the patient's chart and labs.  Questions were answered to the patient's satisfaction.     Loanne Drilling

## 2013-07-07 NOTE — H&P (View-Only) (Signed)
Kelly [name]  DOB: September 07, 1946 Married / Language: English / Race: White Female  Date of Admission:  07/07/2013  Chief Complaint:  Right Hip Pain  History of Present Illness The patient is a 67 year old female who comes in for a preoperative History and Physical. The patient is scheduled for a right total hip arthroplasty to be performed by Dr. Gus Rankin. Aluisio, MD at [facility] on 07-07-13. The patient is a 67 year old female who presents with a hip problem. The patient was seen for a second opinion.The patient reports right hip, right lateral hip and right posterior hip problems including pain, popping and snapping symptoms that have been present for 4 year(s). The symptoms began without any known injury. Symptoms reported include hip pain and popping The patient reports symptoms radiating to the: right thigh. The patient describes the hip problem as sharp, dull, aching, throbbing, grinding, stabbing and shooting. Onset of symptoms was gradual.The symptoms are described as moderate in severity.The patient feels as if their symptoms are does feel they are worsening. Symptoms are relieved by nonsteroidal anti-inflammatory drugs. Current treatment includes nonsteroidal anti-inflammatory drugs. Unfortunately her hip has gotten progressively worse over time. When it began four years ago she noted pain in her buttock area when she was down in a shopping mall in [place]. She had a hard time standing after that and had some dull pain intermittently for a few years after that. Unfortunately since February of this year she has had a marked increase in pain and dysfunction. She had been seen over at [facility] by Dr. Alvester Morin and was felt that problem initially was coming from her back. Dr. Sherlean Foot then saw her and noted that the problem was coming from the hip. She has had intra-articular hip injection which provided her a short term benefit. She feels as though the hip is  definitely progressing and is definitely limiting what she can and can not do. She is at a stage where she is ready to get this hip fixed. Her left hip does not hurt. There is no family history of hip arthritis. They have been treated conservatively in the past for the above stated problem and despite conservative measures, they continue to have progressive pain and severe functional limitations and dysfunction. They have failed non-operative management including home exercise, medications, and injections. It is felt that they would benefit from undergoing total joint replacement. Risks and benefits of the procedure have been discussed with the patient and they elect to proceed with surgery. There are no active contraindications to surgery such as ongoing infection or rapidly progressive neurological disease.   Allergies No Known Drug Allergies. 05/25/2013   Family History First Degree Relatives. reported Diabetes Mellitus. grandmother fathers side Chronic Obstructive Lung Disease. mother   Social History Never smoker Marital status. married Living situation. live with spouse Tobacco use. never smoker Pain Contract. no Number of flights of stairs before winded. 2-3 Current work status. working full time Children. 2 Alcohol use. never consumed alcohol Illicit drug use. no Drug/Alcohol Rehab (Previously). no Drug/Alcohol Rehab (Currently). no Post-Surgical Plans. Home   Medication History Byetta 10 MCG Pen (10MCG/0.04ML Solution, Subcutaneous) Active. Ramipril (10MG  Capsule, Oral) Active. Triamcinolone Acetonide (0.1% Cream, External) Active. Vitamin D (Ergocalciferol) (50000UNIT Capsule, Oral) Active. Aspirin (81MG  Tablet, 1 (one) Oral) Active. MetFORMIN HCl (850MG  Tablet, Oral) Active. (15-850mg ) Simvastatin (40MG  Tablet, Oral) Active. Vitamin D (400UNIT Capsule, 1 (one) Oral) Active. (50000 iu) I CAPS AREDS FORMULA Active. Turmeric Curcumin (500MG   Capsule,  Oral) Active.   Past Surgical History No pertinent past surgical history   Medical History Hypercholesterolemia Osteoarthritis Osteoporosis Diabetes Mellitus, Type II Heart murmur High blood pressure Cholelithiasis. Asymptomatic   Review of Systems General:Not Present- Chills, Fever, Night Sweats, Fatigue, Weight Gain, Weight Loss and Memory Loss. Skin:Not Present- Hives, Itching, Rash, Eczema and Lesions. HEENT:Not Present- Tinnitus, Headache, Double Vision, Visual Loss, Hearing Loss and Dentures. Respiratory:Not Present- Shortness of breath with exertion, Shortness of breath at rest, Allergies, Coughing up blood and Chronic Cough. Cardiovascular:Not Present- Chest Pain, Racing/skipping heartbeats, Difficulty Breathing Lying Down, Murmur, Swelling and Palpitations. Gastrointestinal:Not Present- Bloody Stool, Heartburn, Abdominal Pain, Vomiting, Nausea, Constipation, Diarrhea, Difficulty Swallowing, Jaundice and Loss of appetitie. Female Genitourinary:Not Present- Blood in Urine, Urinary frequency, Weak urinary stream, Discharge, Flank Pain, Incontinence, Painful Urination, Urgency, Urinary Retention and Urinating at Night. Musculoskeletal:Present- Joint Pain. Not Present- Muscle Weakness, Muscle Pain, Joint Swelling, Back Pain, Morning Stiffness and Spasms. Neurological:Not Present- Tremor, Dizziness, Blackout spells, Paralysis, Difficulty with balance and Weakness. Psychiatric:Not Present- Insomnia.   Vitals Weight: 258 lb Height: 61.5 in Body Surface Area: 2.25 m Body Mass Index: 47.96 kg/m Pulse: 64 (Regular) Resp.: 14 (Unlabored) BP: 138/80 (Sitting, Left Arm, Standard)    Physical Exam The physical exam findings are as follows:  Note: Patient is a 67 year old female with continued hip pain. Patient is accompanied today by her husband.   General Mental Status - Alert, cooperative and good historian. General Appearance- pleasant.  Not in acute distress. Orientation- Oriented X3. Build & Nutrition- Well nourished and Well developed.   Head and Neck Head- normocephalic, atraumatic . Neck Global Assessment- bruit auscultated on the left and supple. no bruit auscultated on the right. Carotid Arteries- Left- bruit (faint).   Eye Pupil- Bilateral- Regular and Round. Motion- Bilateral- EOMI.   Chest and Lung Exam Auscultation: Breath sounds:- clear at anterior chest wall and - clear at posterior chest wall. Adventitious sounds:- No Adventitious sounds.   Cardiovascular Auscultation:Rhythm- Regular rate and rhythm. Heart Sounds- S1 WNL and S2 WNL. Murmurs & Other Heart Sounds: Murmur 1:Location- Aortic Area and Pulmonic Area. Timing- Holosystolic. Grade- II/VI. Character- Holosystolic.   Abdomen Inspection:Contour- Generalized moderate distention. Palpation/Percussion:Tenderness- Abdomen is non-tender to palpation. Rigidity (guarding)- Abdomen is soft. Auscultation:Auscultation of the abdomen reveals - Bowel sounds normal.   Female Genitourinary Not done, not pertinent to present illness  Musculoskeletal Well developed moderately obese female, most of her obesity is around the trunk and hips. Her left hip shows normal range of motion with no discomfort. Right hip flexion to about 90, no internal or external rotation, no abduction or adduction. She is slightly tender over the right greater trochanter. Her knee exam is normal. Pulses, sensation and motor are intact. That leg is about 1/2 inch short compared to the left lower extremity.  RADIOGRAPHS: AP pelvis and lateral of the right hip show that she has severe endstage hip arthritis with significant erosion of the femoral head and superior acetabulum. The hip is riding about 1/2 inch high compared to the left hip which looks normal.  Assessment & Plan Osteoarthritis, Hip (715.35) Impression: Right Hip  Note: Plan is for a  Right Total Hip Replacement by Dr. Lequita Halt.  Plan is to go home following the hospital stay.  PCP - Dr. Adrian Prince - Patient has been seen preoperatively and felt to be stable for surgery.  The patient does not have any contraindications and will recieve TXA (tranexamic acid) prior to surgery.  Signed electronically by Lauraine Rinne, III PA-C

## 2013-07-07 NOTE — Anesthesia Postprocedure Evaluation (Signed)
  Anesthesia Post-op Note  Patient: Kelly [name]  Procedure(s) Performed: Procedure(s) (LRB): RIGHT TOTAL HIP ARTHROPLASTY (Right)  Patient Location: PACU  Anesthesia Type: Spinal  Level of Consciousness: awake and alert   Airway and Oxygen Therapy: Patient Spontanous Breathing  Post-op Pain: mild  Post-op Assessment: Post-op Vital signs reviewed, Patient's Cardiovascular Status Stable, Respiratory Function Stable, Patent Airway and No signs of Nausea or vomiting  Last Vitals:  Filed Vitals:   07/07/13 1800  BP: 107/56  Pulse: 59  Temp: 36.4 C  Resp: 13    Post-op Vital Signs: stable   Complications: No apparent anesthesia complications

## 2013-07-07 NOTE — Op Note (Signed)
Pre-operative diagnosis-  Osteoarthritis Right hip   Post-operative diagnosis- Osteoarthritis  Righthip   Procedure   Right   Total Hip Arthroplasty  Surgeon- Kelly [name]. Kelly Golonka, MD  Assistant- Kelly Peace, PA-C   Anesthesia Spinal   EBL- 300 ml  Drain Hemovac  Complication-  None  Condition-  PACU - hemodynamically stable.   Brief Clinical Note-  Kelly [name] is a 67 y.o. female with end stage arthritis of her right hip with progressively worsening pain and dysfunction. Pain occurs with activity and rest including pain at night. She has tried analgesics, protected weight bearing and rest without benefit. Pain is too severe to attempt physical therapy. Radiographs demonstrate bone on bone arthritis with subchondral cyst formation and severe erosion of the femoral head. She presents now for right THA.   Procedure in detail--        The patient is brought into the operating room and placed on the operating table. After successful administration of Spinal  anesthesia, the patient is placed in the Left  lateral decubitus position with the Right side up and held in place with the hip positioner. The lower extremity is isolated from the perineum with plastic drapes and time-out is performed by the surgical team. The lower extremity is then prepped and draped in the usual sterile fashion. A short posterolateral incision is made with a ten blade through the subcutaneous tissue to the level of the fascia lata which is incised in line with the skin incision. The sciatic nerve is palpated and protected and the short external rotators and capsule are isolated from the femur. The hip is then dislocated and the center of the femoral head is marked. A trial prosthesis is placed such that the trial head corresponds to the center of the patients' native femoral head. The resection level is marked on the femoral neck and the resection is made with an oscillating saw. The femoral head is removed and  femoral retractors placed to gain access to the femoral canal.       The canal finder is passed into the femoral canal and the canal is thoroughly irrigated with sterile saline to remove the fatty contents. The lateralizing reamer is then passed into the canal. Broaching is initiated with a size one up to a size 4 broach for the Depuy  [place] cemented system. Broaching was performed to match the patients' native anteversion. The final broach had excellent purchase. The femur is then retracted anteriorly to gain acetabular exposure.           Acetabular retractors are placed and the labrum and osteophytes are removed, Acetabular reaming is performed to 53  mm and a 54  mm Pinnacle acetabular shell is placed in anatomic position with excellent purchase. Additional dome screws were not needed . The permanent 36  mm neutral + 4 Marathon liner is placed into the acetabular shell. The high offset neck is placed with the 36 mm + 8.5 head. The hip is then reduced into the acetabulum.      The hip is reduced with excellent stability with full extension and full external rotation, 70 degrees flexion with 40 degrees adduction and 90 degrees internal rotation and 90 degrees of flexion with 70 degrees of internal rotation. The operative leg is placed on top of the non-operative leg and the leg lengths are found to be equal. The trials are then removed and the femoral canal is sized for the cement resrictor. The size 4 cement restrictor is  placed at the appropriate depth in the femoral canal. The femoral canal is then prepared with pulsatile lavage while the cement is mixed. The canal is dried and once ready for implantation the cement is injected into the canal and pressurized. The size 4 high offset Depuy [place] cemented stem is then impacted into the femoral canal matching native anteversion. All extruded cement is removed. Once the cement is fully hardened, the permanent 36  mm + 8.5 ceramic head is impacted onto the  femoral neck. The hip is then reduced with the same stability parameters. The operative leg is again placed on top of the non-operative leg and the leg lengths are found to be equal.    The wound is then copiously irrigated with saline solution and the capsule and short external rotators are re-attached to the femur through drill holes with Ethibond suture. The fascia lata is closed over a hemovac drain with #1 V-loc suture and the fascia lata, gluteal muscles and subcutaneous tissues are injected with Exparel 20ml diluted with saline 30 ml then 20 ml of .25% Marcaine. The subcutaneous tissues are closed with #1 V- loc 2-0 vicryl and the subcuticular layer closed with running 4-0 Monocryl. The drain is hooked to suction, incision cleaned and dried, and steri-srips and a bulky sterile dressing applied. The limb is placed into a knee immobilizer and the patient is awakened and transported to recovery in stable condition.      Please note that a surgical assistant was a medical necessity for this procedure in order to perform it in a safe and expeditious manner. The assistant was necessary to provide retraction to the vital neurovascular structures and to retract and position the limb to allow for anatomic placement of the prosthetic components.  Kelly [name] Kelly Looper, MD    07/07/2013, 5:02 PM

## 2013-07-07 NOTE — Anesthesia Procedure Notes (Signed)
Spinal  Patient location during procedure: OR Staffing Anesthesiologist: Tane Biegler Performed by: anesthesiologist  Preanesthetic Checklist Completed: patient identified, site marked, surgical consent, pre-op evaluation, timeout performed, IV checked, risks and benefits discussed and monitors and equipment checked Spinal Block Patient position: sitting Prep: Betadine Patient monitoring: heart rate, continuous pulse ox and blood pressure Approach: right paramedian Location: L2-3 Injection technique: single-shot Needle Needle type: Spinocan  Needle gauge: 22 G Needle length: 9 cm Additional Notes Expiration date of kit checked and confirmed. Patient tolerated procedure well, without complications.     

## 2013-07-07 NOTE — Preoperative (Signed)
Beta Blockers   Reason not to administer Beta Blockers:Not Applicable 

## 2013-07-07 NOTE — Transfer of Care (Signed)
Immediate Anesthesia Transfer of Care Note  Patient: Kelly [name]  Procedure(s) Performed: Procedure(s) (LRB): RIGHT TOTAL HIP ARTHROPLASTY (Right)  Patient Location: PACU  Anesthesia Type: Spinal  Level of Consciousness: sedated, patient cooperative and responds to stimulaton  Airway & Oxygen Therapy: Patient Spontanous Breathing and Patient connected to face mask oxgen  Post-op Assessment: Report given to PACU RN and Post -op Vital signs reviewed and stable  Post vital signs: Reviewed and stable  Complications: No apparent anesthesia complications

## 2013-07-07 NOTE — Plan of Care (Signed)
Problem: Consults Goal: Total Joint Replacement Patient Education See Patient Education Module for education specifics. Right total hip

## 2013-07-07 NOTE — Anesthesia Preprocedure Evaluation (Addendum)
Anesthesia Evaluation  Patient identified by MRN, date of birth, ID band Patient awake    Reviewed: Allergy & Precautions, H&P , NPO status , Patient's Chart, lab work & pertinent test results  Airway Mallampati: II TM Distance: >3 FB Neck ROM: Full    Dental no notable dental hx.    Pulmonary neg pulmonary ROS,  breath sounds clear to auscultation  Pulmonary exam normal       Cardiovascular hypertension, Pt. on medications Rhythm:Regular Rate:Normal     Neuro/Psych negative neurological ROS  negative psych ROS   GI/Hepatic negative GI ROS, Neg liver ROS,   Endo/Other  diabetes, Type 2, Oral Hypoglycemic AgentsMorbid obesity  Renal/GU negative Renal ROS  negative genitourinary   Musculoskeletal negative musculoskeletal ROS (+)   Abdominal   Peds negative pediatric ROS (+)  Hematology negative hematology ROS (+)   Anesthesia Other Findings   Reproductive/Obstetrics negative OB ROS                           Anesthesia Physical Anesthesia Plan  ASA: III  Anesthesia Plan: Spinal   Post-op Pain Management:    Induction:   Airway Management Planned: Mask  Additional Equipment:   Intra-op Plan:   Post-operative Plan:   Informed Consent: I have reviewed the patients History and Physical, chart, labs and discussed the procedure including the risks, benefits and alternatives for the proposed anesthesia with the patient or authorized representative who has indicated his/her understanding and acceptance.   Dental advisory given  Plan Discussed with: CRNA  Anesthesia Plan Comments:        Anesthesia Quick Evaluation

## 2013-07-08 ENCOUNTER — Encounter (HOSPITAL_COMMUNITY): Payer: Self-pay | Admitting: Orthopedic Surgery

## 2013-07-08 LAB — BASIC METABOLIC PANEL
BUN: 9 mg/dL (ref 6–23)
CO2: 25 mEq/L (ref 19–32)
Calcium: 8.8 mg/dL (ref 8.4–10.5)
GFR calc non Af Amer: >90 mL/min (ref >90)
Glucose, Bld: 170 mg/dL — ABNORMAL HIGH (ref 70–99)
Potassium: 3.8 mEq/L (ref 3.5–5.1)
Sodium: 137 mEq/L (ref 135–145)

## 2013-07-08 LAB — GLUCOSE, CAPILLARY
Glucose-Capillary: 106 mg/dL — ABNORMAL HIGH (ref 70–99)
Glucose-Capillary: 134 mg/dL — ABNORMAL HIGH (ref 70–99)
Glucose-Capillary: 110 mg/dL — ABNORMAL HIGH (ref 70–99)
Glucose-Capillary: 146 mg/dL — ABNORMAL HIGH (ref 70–99)

## 2013-07-08 LAB — CBC
HCT: 30.3 % — ABNORMAL LOW (ref 36.0–46.0)
Hemoglobin: 10 g/dL — ABNORMAL LOW (ref 12.0–15.0)
MCH: 29.2 pg (ref 26.0–34.0)
MCHC: 33 g/dL (ref 30.0–36.0)
RBC: 3.43 MIL/uL — ABNORMAL LOW (ref 3.87–5.11)

## 2013-07-08 MED ORDER — TRAMADOL HCL 50 MG PO TABS: 50.0000 mg | ORAL_TABLET | Freq: Four times a day (QID) | ORAL | Status: DC | PRN

## 2013-07-08 MED ORDER — SODIUM CHLORIDE 0.9 % IV BOLUS (SEPSIS)
500.0000 mL | Freq: Once | INTRAVENOUS | Status: AC
  Administered 2013-07-08: 500 mL via INTRAVENOUS

## 2013-07-08 MED ORDER — METHOCARBAMOL 500 MG PO TABS: 500.0000 mg | ORAL_TABLET | Freq: Four times a day (QID) | ORAL | Status: DC | PRN

## 2013-07-08 MED ORDER — RIVAROXABAN 10 MG PO TABS: 10.0000 mg | ORAL_TABLET | Freq: Every day | ORAL | Status: DC

## 2013-07-08 MED ORDER — OXYCODONE HCL 5 MG PO TABS: 5.0000 mg | ORAL_TABLET | ORAL | Status: DC | PRN

## 2013-07-08 NOTE — Evaluation (Signed)
Physical Therapy Evaluation Patient Details Name: Kelly [name] MRN: [id number] DOB: [birth year] Today's Date: 07/08/2013 Time: 1200-1232 PT Time Calculation (min): 32 min  PT Assessment / Plan / Recommendation History of Present Illness     Clinical Impression  Pt s/p R THR presents with decreased R LE strength/ROM, post op pain, post THP, obesity and dizziness with activity limiting functional mobility    PT Assessment  Patient needs continued PT services    Follow Up Recommendations  Home health PT    Does the patient have the potential to tolerate intense rehabilitation      Barriers to Discharge        Equipment Recommendations  Rolling walker with 5" wheels (wide RW)    Recommendations for Other Services OT consult   Frequency 7X/week    Precautions / Restrictions Precautions Precautions: Posterior Hip;Fall Precaution Comments: sign hung in room Restrictions Weight Bearing Restrictions: No Other Position/Activity Restrictions: WBAT   Pertinent Vitals/Pain 5/10; premed, ice pack provided      Mobility  Bed Mobility Bed Mobility: Supine to Sit;Sit to Supine Supine to Sit: 1: +2 Total assist Supine to Sit: Patient Percentage: 60% Sit to Supine: 1: +2 Total assist Sit to Supine: Patient Percentage: 10% Details for Bed Mobility Assistance: cues for sequence, use of R LE to self assist and adherence to THP Transfers Transfers: Stand to Sit;Sit to Stand Sit to Stand: 1: +2 Total assist Sit to Stand: Patient Percentage: 60% Stand to Sit: 1: +2 Total assist Stand to Sit: Patient Percentage: 70% Details for Transfer Assistance: cues for LE management and use of UEs to self assist Ambulation/Gait Ambulation/Gait Assistance: Not tested (comment) (not attemtped 2* pt dizziness) Stairs: No    Exercises Total Joint Exercises Ankle Circles/Pumps: AROM;15 reps;Supine;Both Quad Sets: AROM;10 reps;Supine;Both Heel Slides: AAROM;15 reps;Supine;Right Hip  ABduction/ADduction: AAROM;15 reps;Right;Supine   PT Diagnosis: Difficulty walking  PT Problem List: Decreased range of motion;Decreased strength;Decreased activity tolerance;Decreased mobility;Decreased knowledge of use of DME;Obesity;Pain PT Treatment Interventions: DME instruction;Gait training;Stair training;Therapeutic activities;Functional mobility training;Therapeutic exercise;Patient/family education     PT Goals(Current goals can be found in the care plan section) Acute Rehab PT Goals Patient Stated Goal: Resume previous lifestyle with decreased pain PT Goal Formulation: With patient Time For Goal Achievement: 07/15/13 Potential to Achieve Goals: Good  Visit Information  Last PT Received On: 07/08/13 Assistance Needed: +2       Prior Functioning  Home Living Family/patient expects to be discharged to:: Private residence Living Arrangements: Spouse/significant other Available Help at Discharge: Family Type of Home: House Home Access: Stairs to enter Secretary/administrator of Steps: 3 Entrance Stairs-Rails: Right Home Layout: One level Home Equipment: Cane - single point Prior Function Level of Independence: Independent;Independent with assistive device(s) Communication Communication: No difficulties Dominant Hand: Left    Cognition  Cognition Arousal/Alertness: Awake/alert Behavior During Therapy: WFL for tasks assessed/performed Overall Cognitive Status: Within Functional Limits for tasks assessed    Extremity/Trunk Assessment Upper Extremity Assessment Upper Extremity Assessment: Overall WFL for tasks assessed Lower Extremity Assessment Lower Extremity Assessment: RLE deficits/detail RLE Deficits / Details: Hip strength 2+/5 with AAROM at hip to 75 flex and 20 abd   Balance    End of Session PT - End of Session Equipment Utilized During Treatment: Gait belt Activity Tolerance: Patient tolerated treatment well Patient left: in bed;with call bell/phone  within reach;with family/visitor present Nurse Communication: Mobility status  GP     Merary Garguilo 07/08/2013, 1:40 PM

## 2013-07-08 NOTE — Progress Notes (Signed)
   Subjective: 1 Day Post-Op Procedure(s) (LRB): RIGHT TOTAL HIP ARTHROPLASTY (Right) Patient reports pain as mild.   Patient seen in rounds with Dr. Lequita Halt.  Family in room. Patient is well, but has had some minor complaints of pain in the hip, requiring pain medications We will start therapy today.  Plan is to go Home after hospital stay.  Possibly tomorrow depending upon progress.  Objective: Vital signs in last 24 hours: Temp:  [97.4 F (36.3 C)-98.3 F (36.8 C)] 97.9 F (36.6 C) (08/01 0545) Pulse Rate:  [53-85] 53 (08/01 0545) Resp:  [13-22] 20 (08/01 0545) BP: (107-184)/(56-87) 143/69 mmHg (08/01 0545) SpO2:  [99 %-100 %] 99 % (08/01 0545) Weight:  [115.667 kg (255 lb)] 115.667 kg (255 lb) (07/31 1850)  Intake/Output from previous day:  Intake/Output Summary (Last 24 hours) at 07/08/13 0744 Last data filed at 07/08/13 0550  Gross per 24 hour  Intake 3207.5 ml  Output   3550 ml  Net -342.5 ml    Intake/Output this shift:    Labs:  Recent Labs  07/08/13 0415  HGB 10.0*    Recent Labs  07/08/13 0415  WBC 10.3  RBC 3.43*  HCT 30.3*  PLT 234    Recent Labs  07/08/13 0415  NA 137  K 3.8  CL 104  CO2 25  BUN 9  CREATININE 0.41*  GLUCOSE 170*  CALCIUM 8.8   No results found for this basename: LABPT, INR,  in the last 72 hours  EXAM General - Patient is Alert, Appropriate and Oriented Extremity - Neurovascular intact Sensation intact distally Dorsiflexion/Plantar flexion intact Dressing - dressing C/D/I Motor Function - intact, moving foot and toes well on exam.    Past Medical History  Diagnosis Date  . Heart murmur   . Diabetes mellitus without complication   . Arthritis     Assessment/Plan: 1 Day Post-Op Procedure(s) (LRB): RIGHT TOTAL HIP ARTHROPLASTY (Right) Principal Problem:   OA (osteoarthritis) of hip  Estimated body mass index is 46.63 kg/(m^2) as calculated from the following:   Height as of this encounter: 5\' 2"  (1.575  m).   Weight as of this encounter: 115.667 kg (255 lb). Advance diet Up with therapy Plan for discharge tomorrow Discharge home with home health  DVT Prophylaxis - Xarelto Weight Bearing As Tolerated right Leg Hemovac Pulled Begin Therapy Hip Preacutions No vaccines.  Kelly [name] 07/08/2013, 7:44 AM

## 2013-07-08 NOTE — Progress Notes (Signed)
Utilization review completed.  

## 2013-07-08 NOTE — Care Management Note (Signed)
    Page 1 of 2   07/09/2013     2:15:25 PM   CARE MANAGEMENT NOTE 07/09/2013  Patient:  Kelly [name], Kelly [name]   Account Number:  [id number]  Date Initiated:  07/08/2013  Documentation initiated by:  Colleen Can  Subjective/Objective Assessment:   dx rt total hip replacemnt     Action/Plan:   CM spoke with patient. Plans are for her to return to her home in [place] where family will care for her. She will need RW, BSC commode, and shower chair.   Anticipated DC Date:  07/09/2013   Anticipated DC Plan:  HOME W HOME HEALTH SERVICES      DC Planning Services  CM consult      PAC Choice  DURABLE MEDICAL EQUIPMENT  HOME HEALTH   Choice offered to / List presented to:  C-1 Patient   DME arranged  WALKER - ROLLING  3-N-1  SHOWER STOOL      DME agency  [facility]     HH arranged  HH-2 PT      [facility] agency  [facility]   Status of service:  Completed, signed off Medicare Important Message given?   (If response is "NO", the following Medicare IM given date fields will be blank) Date Medicare IM given:   Date Additional Medicare IM given:    Discharge Disposition:  HOME W HOME HEALTH SERVICES  Per UR Regulation:  Reviewed for med. necessity/level of care/duration of stay  If discussed at Long Length of Stay Meetings, dates discussed:    Comments:  07/09/13 Kelly Habel RN,BSN NCM WEEKEND [phone] D/C HOME TODAY,HHPT ORDERS [facility]) REPTEL#[phone] AWARE OF D/C &  HHPT.[facility] DME REP JERMAINE TEL#[phone] AWARE OF DME ORDERS, & D/C WILL BRING TO RM.

## 2013-07-08 NOTE — Discharge Instructions (Signed)
Dr. Gaynelle Arabian Total Joint Specialist [facility] [address], [place], [place] [address] [phone]   TOTAL HIP REPLACEMENT POSTOPERATIVE DIRECTIONS    Hip Rehabilitation, Guidelines Following Surgery  The results of a hip operation are greatly improved after range of motion and muscle strengthening exercises. Follow all safety measures which are given to protect your hip. If any of these exercises cause increased pain or swelling in your joint, decrease the amount until you are comfortable again. Then slowly increase the exercises. Call your caregiver if you have problems or questions.  HOME CARE INSTRUCTIONS  Most of the following instructions are designed to prevent the dislocation of your new hip.  Remove items at home which could result in a fall. This includes throw rugs or furniture in walking pathways.  Continue medications as instructed at time of discharge.  You may have some home medications which will be placed on hold until you complete the course of blood thinner medication.  You may start showering once you are discharged home but do not submerge the incision under water. Just pat the incision dry and apply a dry gauze dressing on daily. Do not put on socks or shoes without following the instructions of your caregivers.  Sit on high chairs so your hips are not bent more than 90 degrees.  Sit on chairs with arms. Use the chair arms to help push yourself up when arising.  Keep your leg on the side of the operation out in front of you when standing up.  Arrange for the use of a toilet seat elevator so you are not sitting low.  Do not do any exercises or get in any positions that cause your toes to point in (pigeon toed).  Always sleep with a pillow between your legs. Do not lie on your side in sleep with both knees touching the bed.   Walk with walker as instructed.  You may resume a sexual relationship in one month or when given the OK by  your caregiver.  Use walker as long as suggested by your caregivers.  You may put full weight on your legs and walk as much as is comfortable. Avoid periods of inactivity such as sitting longer than an hour when not asleep. This helps prevent blood clots.  You may return to work once you are cleared by Engineer, production.  Do not drive a car for 6 weeks or until released by your surgeon.  Do not drive while taking narcotics.  Wear elastic stockings for three weeks following surgery during the day but you may remove then at night.  Make sure you keep all of your appointments after your operation with all of your doctors and caregivers. You should call the office at the above phone number and make an appointment for approximately two weeks after the date of your surgery. Change the dressing daily and reapply a dry dressing each time. Please pick up a stool softener and laxative for home use as long as you are requiring pain medications.  Continue to use ice on the hip for pain and swelling from surgery. You may notice swelling that will progress down to the foot and ankle.  This is normal after  surgery.  Elevate the leg when you are not up walking on it.   It is important for you to complete the blood thinner medication as prescribed by your doctor.  Continue to use the breathing machine which will help keep your temperature down.  It  is common for your temperature to cycle up and down following surgery, especially at night when you are not up moving around and exerting yourself.  The breathing machine keeps your lungs expanded and your temperature down.  RANGE OF MOTION AND STRENGTHENING EXERCISES  These exercises are designed to help you keep full movement of your hip joint. Follow your caregiver's or physical therapist's instructions. Perform all exercises about fifteen times, three times per day or as directed. Exercise both hips, even if you have had only one joint replacement. These exercises can be  done on a training (exercise) mat, on the floor, on a table or on a bed. Use whatever works the best and is most comfortable for you. Use music or television while you are exercising so that the exercises are a pleasant break in your day. This will make your life better with the exercises acting as a break in routine you can look forward to.  Lying on your back, slowly slide your foot toward your buttocks, raising your knee up off the floor. Then slowly slide your foot back down until your leg is straight again.  Lying on your back spread your legs as far apart as you can without causing discomfort.  Lying on your side, raise your upper leg and foot straight up from the floor as far as is comfortable. Slowly lower the leg and repeat.  Lying on your back, tighten up the muscle in the front of your thigh (quadriceps muscles). You can do this by keeping your leg straight and trying to raise your heel off the floor. This helps strengthen the largest muscle supporting your knee.  Lying on your back, tighten up the muscles of your buttocks both with the legs straight and with the knee bent at a comfortable angle while keeping your heel on the floor.   SKILLED REHAB INSTRUCTIONS: If the patient is transferred to a skilled rehab facility following release from the hospital, a list of the current medications will be sent to the facility for the patient to continue.  When discharged from the skilled rehab facility, please have the facility set up the patient's Home Health Physical Therapy prior to being released. Also, the skilled facility will be responsible for providing the patient with their medications at time of release from the facility to include their pain medication, the muscle relaxants, and their blood thinner medication. If the patient is still at the rehab facility at time of the two week follow up appointment, the skilled rehab facility will also need to assist the patient in arranging follow up  appointment in our office and any transportation needs.  MAKE SURE YOU:  Understand these instructions.  Will watch your condition.  Will get help right away if you are not doing well or get worse.  Pick up stool softner and laxative for home. Do not submerge incision under water. May shower. Continue to use ice for pain and swelling from surgery. Hip precautions.  Total Hip Protocol.  Take Xarelto for two and a half more weeks, then discontinue Xarelto. Once the patient has completed the Xarelto, they may resume the 81 mg Aspirin.

## 2013-07-08 NOTE — Progress Notes (Signed)
OT Cancellation Note  Patient Details Name: Kelly [name] MRN: [id number] DOB: 1946-02-20   Cancelled Treatment:    Reason Eval/Treat Not Completed: Other (comment) Not ready for OT today.  Will reattempt tomorrow.    Qusay Villada 07/08/2013, 12:44 PM Marica Otter, OTR/L [phone] 07/08/2013

## 2013-07-08 NOTE — Discharge Summary (Signed)
Physician Discharge Summary   Patient ID: Kelly [name] MRN: [id number] DOB/AGE: 66-21-1947 66 y.o.  Admit date: 07/07/2013 Discharge date: Tentative Date of Discharge - 07/09/2013  Primary Diagnosis:  Osteoarthritis Right hip   Admission Diagnoses:  Past Medical History  Diagnosis Date  . Heart murmur   . Diabetes mellitus without complication   . Arthritis    Discharge Diagnoses:   Principal Problem:   OA (osteoarthritis) of hip  Estimated body mass index is 46.63 kg/(m^2) as calculated from the following:   Height as of this encounter: 5\' 2"  (1.575 m).   Weight as of this encounter: 115.667 kg (255 lb).  Procedure(s) (LRB): RIGHT TOTAL HIP ARTHROPLASTY (Right)   Consults: None  HPI: Kelly [name] is a 67 y.o. female with end stage arthritis of her right hip with progressively worsening pain and dysfunction. Pain occurs with activity and rest including pain at night. She has tried analgesics, protected weight bearing and rest without benefit. Pain is too severe to attempt physical therapy. Radiographs demonstrate bone on bone arthritis with subchondral cyst formation and severe erosion of the femoral head. She presents now for right THA.   Laboratory Data: Admission on 07/07/2013  Component Date Value Range Status  . Glucose-Capillary 07/07/2013 100* 70 - 99 mg/dL Final  . Comment 1 40/98/1191 Documented in Chart   Final  . Comment 2 07/07/2013 Notify RN   Final  . WBC 07/08/2013 10.3  4.0 - 10.5 K/uL Final  . RBC 07/08/2013 3.43* 3.87 - 5.11 MIL/uL Final  . Hemoglobin 07/08/2013 10.0* 12.0 - 15.0 g/dL Final  . HCT 47/82/9562 30.3* 36.0 - 46.0 % Final  . MCV 07/08/2013 88.3  78.0 - 100.0 fL Final  . MCH 07/08/2013 29.2  26.0 - 34.0 pg Final  . MCHC 07/08/2013 33.0  30.0 - 36.0 g/dL Final  . RDW 13/07/6577 13.5  11.5 - 15.5 % Final  . Platelets 07/08/2013 234  150 - 400 K/uL Final  . Sodium 07/08/2013 137  135 - 145 mEq/L Final  . Potassium 07/08/2013 3.8  3.5 -  5.1 mEq/L Final  . Chloride 07/08/2013 104  96 - 112 mEq/L Final  . CO2 07/08/2013 25  19 - 32 mEq/L Final  . Glucose, Bld 07/08/2013 170* 70 - 99 mg/dL Final  . BUN 46/96/2952 9  6 - 23 mg/dL Final  . Creatinine, Ser 07/08/2013 0.41* 0.50 - 1.10 mg/dL Final  . Calcium 84/13/2440 8.8  8.4 - 10.5 mg/dL Final  . GFR calc non Af Amer 07/08/2013 >90  >90 mL/min Final  . GFR calc Af Amer 07/08/2013 >90  >90 mL/min Final   Comment:                                 The eGFR has been calculated                          using the CKD EPI equation.                          This calculation has not been                          validated in all clinical  situations.                          eGFR's persistently                          <90 mL/min signify                          possible Chronic Kidney Disease.  . Glucose-Capillary 07/07/2013 200* 70 - 99 mg/dL Final  . Glucose-Capillary 07/07/2013 106* 70 - 99 mg/dL Final  . Glucose-Capillary 07/08/2013 134* 70 - 99 mg/dL Final  . Comment 1 91/47/8295 Notify RN   Final  . Comment 2 07/08/2013 Documented in Chart   Final  . Glucose-Capillary 07/08/2013 110* 70 - 99 mg/dL Final  . Comment 1 62/13/0865 Notify RN   Final  . Comment 2 07/08/2013 Documented in Chart   Final  Hospital Outpatient Visit on 06/30/2013  Component Date Value Range Status  . aPTT 06/30/2013 34  24 - 37 seconds Final  . ABO/RH(D) 06/30/2013 O POS   Final  . Antibody Screen 06/30/2013 NEG   Final  . Sample Expiration 06/30/2013 07/10/2013   Final  . Color, Urine 06/30/2013 YELLOW  YELLOW Final  . APPearance 06/30/2013 CLEAR  CLEAR Final  . Specific Gravity, Urine 06/30/2013 1.012  1.005 - 1.030 Final  . pH 06/30/2013 6.0  5.0 - 8.0 Final  . Glucose, UA 06/30/2013 NEGATIVE  NEGATIVE mg/dL Final  . Hgb urine dipstick 06/30/2013 NEGATIVE  NEGATIVE Final  . Bilirubin Urine 06/30/2013 NEGATIVE  NEGATIVE Final  . Ketones, ur 06/30/2013 NEGATIVE   NEGATIVE mg/dL Final  . Protein, ur 78/46/9629 NEGATIVE  NEGATIVE mg/dL Final  . Urobilinogen, UA 06/30/2013 1.0  0.0 - 1.0 mg/dL Final  . Nitrite 52/84/1324 NEGATIVE  NEGATIVE Final  . Leukocytes, UA 06/30/2013 TRACE* NEGATIVE Final  . MRSA, PCR 06/30/2013 NEGATIVE  NEGATIVE Final  . Staphylococcus aureus 06/30/2013 POSITIVE* NEGATIVE Final   Comment:                                 The Xpert SA Assay (FDA                          approved for NASAL specimens                          in patients over 68 years of age),                          is one component of                          a comprehensive surveillance                          program.  Test performance has                          been validated by [employer] for patients greater  than or equal to 61 year old.                          It is not intended                          to diagnose infection nor to                          guide or monitor treatment.  . Squamous Epithelial / LPF 06/30/2013 RARE  RARE Final  . WBC, UA 06/30/2013 0-2  <3 WBC/hpf Final  . ABO/RH(D) 06/30/2013 O POS   Final     X-Rays:Dg Chest 2 View  06/30/2013   *RADIOLOGY REPORT*  Clinical Data: Hypertension, preoperative evaluation for hip surgery  CHEST - 2 VIEW  Comparison: None.  Findings: The heart and pulmonary vascularity are within normal limits.  The lungs are clear bilaterally.  No acute infiltrate or sizable effusion is noted. Degenerative changes of the thoracic spine are seen.  IMPRESSION: No acute abnormality noted.   Original Report Authenticated By: Alcide Clever, M.D.   Dg Hip Complete Right  06/30/2013   *RADIOLOGY REPORT*  Clinical Data: Preoperative evaluation for right hip replacement, osteoarthritis, diabetes  RIGHT HIP - COMPLETE 2+ VIEW  Comparison: None  Findings: Osseous demineralization. Advanced osteoarthritic changes of right hip joint with joint space obliteration, spur  formation, subchondral sclerosis, and superolateral subluxation of right femoral head. Marked deformity and flattening of right femoral head. No acute fracture, dislocation or additional bone destruction. Degenerative disc and facet disease changes lumbar spine. SI joint and left hip joint spaces preserved.  IMPRESSION: Advanced osteoarthritic changes of the right hip as above. Significant degenerative disc and facet disease changes lumbar spine. Osseous demineralization.   Original Report Authenticated By: Ulyses Southward, M.D.   Dg Pelvis Portable  07/07/2013   *RADIOLOGY REPORT*  Clinical Data: Status post right hip replacement surgery.  PORTABLE PELVIS  Comparison: 06/30/2013  Findings: Frontal projection shows normal alignment of a total right hip arthroplasty.  There is some postoperative air adjacent to the joint.  No fracture or abnormal lucency is identified.  Soft tissues are unremarkable with a single lateral surgical drain present.  IMPRESSION: Normal alignment of the right hip arthroplasty.   Original Report Authenticated By: Irish Lack, M.D.   Mm Digital Screening  07/04/2013   *RADIOLOGY REPORT*  Clinical Data: Screening.  DIGITAL SCREENING BILATERAL MAMMOGRAM WITH CAD  Comparison: 03/05/2007  FINDINGS:  ACR Breast Density Category a:  The breast tissue is almost entirely fatty.  There is no suspicious dominant mass, architectural distortion, or calcification to suggest malignancy.  Images were processed with CAD.  IMPRESSION: No mammographic evidence of malignancy.  A result letter of this screening mammogram will be mailed directly to the patient.  RECOMMENDATION: Screening mammogram in one year. (Code:SM-B-01Y)  BI-RADS CATEGORY 1:  Negative.   Original Report Authenticated By: Cain Saupe, M.D.    EKG:No orders found for this or any previous visit.   Hospital Course: Patient was admitted to [facility] and taken to the OR and underwent the above state procedure without  complications.  Patient tolerated the procedure well and was later transferred to the recovery room and then to the orthopaedic floor for postoperative care.  They were given PO and IV analgesics for pain control following their surgery.  They were given 24  hours of postoperative antibiotics of  Anti-infectives   Start     Dose/Rate Route Frequency Ordered Stop   07/07/13 2200  ceFAZolin (ANCEF) IVPB 2 g/50 mL premix     2 g 100 mL/hr over 30 Minutes Intravenous Every 6 hours 07/07/13 1856 07/08/13 0446   07/07/13 1239  ceFAZolin (ANCEF) IVPB 2 g/50 mL premix     2 g 100 mL/hr over 30 Minutes Intravenous 30 min pre-op 07/07/13 1239 07/07/13 1517     and started on DVT prophylaxis in the form of Xarelto.   PT and OT were ordered for total hip protocol.  The patient was allowed to be WBAT with therapy. Discharge planning was consulted to help with postop disposition and equipment needs.  Patient had a good night on the evening of surgery.  They started to get up OOB with therapy on day one.  Hemovac drain was pulled without difficulty.  The knee immobilizer was removed and discontinued.  Continued to work with therapy into day two.  Dressing was changed on day two and the incision was healing well.  Patient was seen in rounds on day two and was doing well.  It was felt that as long as the patient continued to improve, they would be ready to transfer the following day, Saturday 07/09/2013.  The patient was evaluated by the weekend coverage staff and was dischargedl.  This summary was prepared in anticipation of the the patient's transfer over the weekend.   Discharge Medications: Prior to Admission medications   Medication Sig Start Date End Date Taking? Authorizing Provider  exenatide (BYETTA) 10 MCG/0.04ML SOPN Inject 10 mcg into the skin daily.   Yes Historical Provider, MD  pioglitazone-metformin (ACTOPLUS MET) 15-850 MG per tablet Take 1 tablet by mouth every evening.   Yes Historical Provider, MD    ramipril (ALTACE) 10 MG capsule Take 20 mg by mouth every evening.   Yes Historical Provider, MD  methocarbamol (ROBAXIN) 500 MG tablet Take 1 tablet (500 mg total) by mouth every 6 (six) hours as needed. 07/08/13   Folasade Mooty, PA-C  oxyCODONE (OXY IR/ROXICODONE) 5 MG immediate release tablet Take 1-2 tablets (5-10 mg total) by mouth every 3 (three) hours as needed. 07/08/13   Cohl Behrens Julien Girt, PA-C  rivaroxaban (XARELTO) 10 MG TABS tablet Take 1 tablet (10 mg total) by mouth daily with breakfast. Take Xarelto for two and a half more weeks, then discontinue Xarelto. Once the patient has completed the Xarelto, they may resume the 81 mg Aspirin. 07/08/13   Shaolin Armas Julien Girt, PA-C  simvastatin (ZOCOR) 40 MG tablet Take 40 mg by mouth every evening.    Historical Provider, MD  traMADol (ULTRAM) 50 MG tablet Take 1-2 tablets (50-100 mg total) by mouth every 6 (six) hours as needed (mild pain). 07/08/13   Khaidyn Staebell Julien Girt, PA-C    Diet: Cardiac diet and Diabetic diet Activity:WBAT Follow-up:in 2 weeks Disposition - Home Discharged Condition: good   Discharge Orders   Future Orders Complete By Expires     Call MD / Call 911  As directed     Comments:      If you experience chest pain or shortness of breath, CALL 911 and be transported to the hospital emergency room.  If you develope a fever above 101 F, pus (white drainage) or increased drainage or redness at the wound, or calf pain, call your surgeon's office.    Change dressing  As directed     Comments:  You may change your dressing dressing daily with sterile 4 x 4 inch gauze dressing and paper tape.  Do not submerge the incision under water.    Constipation Prevention  As directed     Comments:      Drink plenty of fluids.  Prune juice may be helpful.  You may use a stool softener, such as Colace (over the counter) 100 mg twice a day.  Use MiraLax (over the counter) for constipation as needed.    Diet - low sodium heart  healthy  As directed     Discharge instructions  As directed     Comments:      Pick up stool softner and laxative for home. Do not submerge incision under water. May shower. Continue to use ice for pain and swelling from surgery. Hip precautions.  Total Hip Protocol.  Take Xarelto for two and a half more weeks, then discontinue Xarelto. Once the patient has completed the Xarelto, they may resume the 81 mg Aspirin.    Do not sit on low chairs, stoools or toilet seats, as it may be difficult to get up from low surfaces  As directed     Driving restrictions  As directed     Comments:      No driving until released by the physician.    Follow the hip precautions as taught in Physical Therapy  As directed     Increase activity slowly as tolerated  As directed     Lifting restrictions  As directed     Comments:      No lifting until released by the physician.    Patient may shower  As directed     Comments:      You may shower without a dressing once there is no drainage.  Do not wash over the wound.  If drainage remains, do not shower until drainage stops.    TED hose  As directed     Comments:      Use stockings (TED hose) for 3 weeks on both leg(s).  You may remove them at night for sleeping.    Weight bearing as tolerated  As directed         Medication List    STOP taking these medications       aspirin EC 81 MG tablet     ICAPS PO     OVER THE COUNTER MEDICATION     Vitamin D (Ergocalciferol) 50000 UNITS Caps  Commonly known as:  DRISDOL      TAKE these medications       exenatide 10 MCG/0.04ML Sopn  Commonly known as:  BYETTA  Inject 10 mcg into the skin daily.     methocarbamol 500 MG tablet  Commonly known as:  ROBAXIN  Take 1 tablet (500 mg total) by mouth every 6 (six) hours as needed.     oxyCODONE 5 MG immediate release tablet  Commonly known as:  Oxy IR/ROXICODONE  Take 1-2 tablets (5-10 mg total) by mouth every 3 (three) hours as needed.      pioglitazone-metformin 15-850 MG per tablet  Commonly known as:  ACTOPLUS MET  Take 1 tablet by mouth every evening.     ramipril 10 MG capsule  Commonly known as:  ALTACE  Take 20 mg by mouth every evening.     rivaroxaban 10 MG Tabs tablet  Commonly known as:  XARELTO  - Take 1 tablet (10 mg total) by mouth daily with breakfast. Take  Xarelto for two and a half more weeks, then discontinue Xarelto.  - Once the patient has completed the Xarelto, they may resume the 81 mg Aspirin.     simvastatin 40 MG tablet  Commonly known as:  ZOCOR  Take 40 mg by mouth every evening.     traMADol 50 MG tablet  Commonly known as:  ULTRAM  Take 1-2 tablets (50-100 mg total) by mouth every 6 (six) hours as needed (mild pain).           Follow-up Information   Follow up with Loanne Drilling, MD. Schedule an appointment as soon as possible for a visit in 2 weeks.   Contact information:   [address] [place] [address] [phone]       Signed: Patrica Duel 07/08/2013, 3:02 PM

## 2013-07-08 NOTE — Progress Notes (Signed)
Physical Therapy Treatment Patient Details Name: Kelly [name] MRN: [id number] DOB: [birth year] Today's Date: 07/08/2013 Time: 4098-1191 PT Time Calculation (min): 16 min  PT Assessment / Plan / Recommendation  History of Present Illness     PT Comments     Follow Up Recommendations  Home health PT     Does the patient have the potential to tolerate intense rehabilitation     Barriers to Discharge        Equipment Recommendations  Rolling walker with 5" wheels    Recommendations for Other Services OT consult  Frequency 7X/week   Progress towards PT Goals Progress towards PT goals: Progressing toward goals  Plan Current plan remains appropriate    Precautions / Restrictions Precautions Precautions: Posterior Hip;Fall Precaution Comments: pt recalls 2/3 THP without cues Restrictions Weight Bearing Restrictions: No Other Position/Activity Restrictions: WBAT   Pertinent Vitals/Pain 4/10;     Mobility  Bed Mobility Bed Mobility: Supine to Sit Supine to Sit: 1: +2 Total assist Supine to Sit: Patient Percentage: 70% Details for Bed Mobility Assistance: cues for sequence, use of R LE to self assist and adherence to THP Transfers Transfers: Stand to Sit;Sit to Stand Sit to Stand: 1: +2 Total assist Sit to Stand: Patient Percentage: 70% Stand to Sit: 1: +2 Total assist Stand to Sit: Patient Percentage: 70% Details for Transfer Assistance: cues for LE management and use of UEs to self assist Ambulation/Gait Ambulation/Gait Assistance: 1: +2 Total assist Ambulation/Gait: Patient Percentage: 70% Ambulation Distance (Feet): 17 Feet Assistive device: Rolling walker Ambulation/Gait Assistance Details: cues for posture, sequence, position from RW, and ER on R Gait Pattern: Step-to pattern;Decreased step length - right;Decreased step length - left;Shuffle;Trunk flexed Stairs: No    Exercises     PT Diagnosis:    PT Problem List:   PT Treatment Interventions:     PT  Goals (current goals can now be found in the care plan section) Acute Rehab PT Goals Patient Stated Goal: Resume previous lifestyle with decreased pain PT Goal Formulation: With patient Time For Goal Achievement: 07/15/13 Potential to Achieve Goals: Good  Visit Information  Last PT Received On: 07/08/13 Assistance Needed: +2 (saftey)    Subjective Data  Subjective: No c/o dizziness - c/o feeling hot Patient Stated Goal: Resume previous lifestyle with decreased pain   Cognition  Cognition Arousal/Alertness: Awake/alert Behavior During Therapy: WFL for tasks assessed/performed Overall Cognitive Status: Within Functional Limits for tasks assessed    Balance     End of Session PT - End of Session Equipment Utilized During Treatment: Gait belt Activity Tolerance: Patient tolerated treatment well Patient left: in chair;with call bell/phone within reach;with family/visitor present Nurse Communication: Mobility status   GP     Kelly [name] 07/08/2013, 4:16 PM

## 2013-07-08 NOTE — Progress Notes (Signed)
Physical Therapy Treatment Patient Details Name: ARLEIGH DICOLA MRN: [id number] DOB: 1946-11-18 Today's Date: 07/08/2013 Time: 4098-1191 PT Time Calculation (min): 13 min  PT Assessment / Plan / Recommendation  History of Present Illness     PT Comments     Follow Up Recommendations  Home health PT     Does the patient have the potential to tolerate intense rehabilitation     Barriers to Discharge        Equipment Recommendations  Rolling walker with 5" wheels (WIDE RW)    Recommendations for Other Services OT consult  Frequency 7X/week   Progress towards PT Goals Progress towards PT goals: Progressing toward goals  Plan Current plan remains appropriate    Precautions / Restrictions Precautions Precautions: Posterior Hip;Fall Precaution Comments: pt recalls 2/3 THP without cues Restrictions Weight Bearing Restrictions: No Other Position/Activity Restrictions: WBAT   Pertinent Vitals/Pain     Mobility  Bed Mobility Bed Mobility: Sit to Supine Supine to Sit: 1: +2 Total assist Supine to Sit: Patient Percentage: 70% Sit to Supine: 1: +2 Total assist Sit to Supine: Patient Percentage: 60% Details for Bed Mobility Assistance: cues for sequence, use of R LE to self assist and adherence to THP Transfers Transfers: Stand to Sit;Sit to Stand Sit to Stand: 1: +2 Total assist Sit to Stand: Patient Percentage: 70% Stand to Sit: 1: +2 Total assist Stand to Sit: Patient Percentage: 80% Details for Transfer Assistance: cues for LE management and use of UEs to self assist Ambulation/Gait Ambulation/Gait Assistance: 1: +2 Total assist Ambulation/Gait: Patient Percentage: 70% Ambulation Distance (Feet): 5 Feet Assistive device: Rolling walker Ambulation/Gait Assistance Details: cues for sequence, posture, position from RW and ER on R Gait Pattern: Step-to pattern;Decreased step length - right;Decreased step length - left;Shuffle;Trunk flexed Stairs: No    Exercises      PT Diagnosis:    PT Problem List:   PT Treatment Interventions:     PT Goals (current goals can now be found in the care plan section) Acute Rehab PT Goals Patient Stated Goal: Resume previous lifestyle with decreased pain PT Goal Formulation: With patient Time For Goal Achievement: 07/15/13 Potential to Achieve Goals: Good  Visit Information  Last PT Received On: 07/08/13 Assistance Needed: +2    Subjective Data  Subjective: You need to show me how to get back in bed Patient Stated Goal: Resume previous lifestyle with decreased pain   Cognition  Cognition Arousal/Alertness: Awake/alert Behavior During Therapy: WFL for tasks assessed/performed Overall Cognitive Status: Within Functional Limits for tasks assessed    Balance     End of Session PT - End of Session Equipment Utilized During Treatment: Gait belt Activity Tolerance: Patient tolerated treatment well Patient left: in bed;with call bell/phone within reach;with nursing/sitter in room;with family/visitor present Nurse Communication: Mobility status   GP     Kurtis Anastasia 07/08/2013, 6:05 PM

## 2013-07-09 ENCOUNTER — Encounter (HOSPITAL_COMMUNITY): Payer: Self-pay | Admitting: *Deleted

## 2013-07-09 LAB — GLUCOSE, CAPILLARY: Glucose-Capillary: 146 mg/dL — ABNORMAL HIGH (ref 70–99)

## 2013-07-09 LAB — BASIC METABOLIC PANEL
Calcium: 8.5 mg/dL (ref 8.4–10.5)
GFR calc Af Amer: >90 mL/min (ref >90)
GFR calc non Af Amer: >90 mL/min (ref >90)
Glucose, Bld: 163 mg/dL — ABNORMAL HIGH (ref 70–99)
Sodium: 137 mEq/L (ref 135–145)

## 2013-07-09 LAB — CBC
Hemoglobin: 9.3 g/dL — ABNORMAL LOW (ref 12.0–15.0)
MCH: 29 pg (ref 26.0–34.0)
MCHC: 32.4 g/dL (ref 30.0–36.0)
Platelets: 207 10*3/uL (ref 150–400)
RDW: 13.8 % (ref 11.5–15.5)

## 2013-07-09 NOTE — Progress Notes (Signed)
Physical Therapy Treatment Patient Details Name: ANGLINE SCHWEIGERT MRN: [id number] DOB: [birth year] Today's Date: 07/09/2013 Time: 7829-5621 PT Time Calculation (min): 24 min  PT Assessment / Plan / Recommendation  History of Present Illness This 67 y.o. female admitted for elective Rt. THA posterior approach   PT Comments   Reviewed car transfers with pt and family.  Family present for stair training  Follow Up Recommendations  Home health PT     Does the patient have the potential to tolerate intense rehabilitation     Barriers to Discharge        Equipment Recommendations  Rolling walker with 5" wheels    Recommendations for Other Services OT consult  Frequency 7X/week   Progress towards PT Goals Progress towards PT goals: Progressing toward goals  Plan Current plan remains appropriate    Precautions / Restrictions Precautions Precautions: Posterior Hip;Fall Precaution Comments: Pt able to state 3/3 THA precautions, Restrictions Weight Bearing Restrictions: No Other Position/Activity Restrictions: WBAT   Pertinent Vitals/Pain 4/10; premed    Mobility  Bed Mobility Bed Mobility: Not assessed Transfers Transfers: Stand to Sit;Sit to Stand Sit to Stand: 5: Supervision Stand to Sit: 5: Supervision Details for Transfer Assistance: cues for hand placement and technique Ambulation/Gait Ambulation/Gait Assistance: 4: Min guard Ambulation Distance (Feet): 75 Feet Assistive device: Rolling walker Ambulation/Gait Assistance Details: cues for posture and position from RW Gait Pattern: Step-to pattern Stairs: Yes Stairs Assistance: 4: Min assist Stairs Assistance Details (indicate cue type and reason): cues for sequence and foot/cane placement Stair Management Technique: One rail Right;Step to pattern;Forwards;With cane Number of Stairs: 3    Exercises     PT Diagnosis:    PT Problem List:   PT Treatment Interventions:     PT Goals (current goals can now be found in  the care plan section) Acute Rehab PT Goals Patient Stated Goal: Regain independence PT Goal Formulation: With patient Time For Goal Achievement: 07/15/13 Potential to Achieve Goals: Good  Visit Information  Last PT Received On: 07/09/13 Assistance Needed: +1 History of Present Illness: This 67 y.o. female admitted for elective Rt. THA posterior approach    Subjective Data  Subjective: Doing much better than yesterday Patient Stated Goal: Regain independence   Cognition  Cognition Arousal/Alertness: Awake/alert Behavior During Therapy: WFL for tasks assessed/performed Overall Cognitive Status: Within Functional Limits for tasks assessed    Balance     End of Session PT - End of Session Equipment Utilized During Treatment: Gait belt Activity Tolerance: Patient tolerated treatment well Patient left: in chair;with call bell/phone within reach;with family/visitor present Nurse Communication: Mobility status   GP     Dakotah Orrego 07/09/2013, 4:47 PM

## 2013-07-09 NOTE — Progress Notes (Signed)
    Subjective: 2 Days Post-Op Procedure(s) (LRB): RIGHT TOTAL HIP ARTHROPLASTY (Right) Patient reports pain as 2 on 0-10 scale.   Denies CP or SOB.  Voiding without difficulty. Positive flatus. Objective: Vital signs in last 24 hours: Temp:  [98.2 F (36.8 C)-98.7 F (37.1 C)] 98.2 F (36.8 C) (08/02 0520) Pulse Rate:  [68-99] 74 (08/02 0520) Resp:  [16-18] 16 (08/02 0520) BP: (107-146)/(63-84) 146/76 mmHg (08/02 0520) SpO2:  [98 %-100 %] 98 % (08/02 0520)  Intake/Output from previous day: 08/01 0701 - 08/02 0700 In: 2199.5 [P.O.:1080; I.V.:619.5; IV Piggyback:500] Out: 2800 [Urine:2800] Intake/Output this shift:    Labs:  Recent Labs  07/08/13 0415 07/09/13 0426  HGB 10.0* 9.3*    Recent Labs  07/08/13 0415 07/09/13 0426  WBC 10.3 10.0  RBC 3.43* 3.21*  HCT 30.3* 28.7*  PLT 234 207    Recent Labs  07/08/13 0415 07/09/13 0426  NA 137 137  K 3.8 4.0  CL 104 104  CO2 25 26  BUN 9 13  CREATININE 0.41* 0.55  GLUCOSE 170* 163*  CALCIUM 8.8 8.5   No results found for this basename: LABPT, INR,  in the last 72 hours  Physical Exam: Neurologically intact ABD soft Neurovascular intact Intact pulses distally Incision: dressing C/D/I and no drainage Compartment soft  Assessment/Plan: 2 Days Post-Op Procedure(s) (LRB): RIGHT TOTAL HIP ARTHROPLASTY (Right) Advance diet Up with therapy  Diedra Sinor D for Dr. Venita Lick [facility] [phone] 07/09/2013, 7:54 AM

## 2013-07-09 NOTE — Progress Notes (Addendum)
Occupational Therapy Note   07/09/13 1241  OT Visit Information  Last OT Received On 07/09/13  Assistance Needed +1  History of Present Illness This 67 y.o. female admitted for elective Rt. THA posterior approach  OT Time Calculation  OT Start Time 1136  OT Stop Time 1215  OT Time Calculation (min) 39 min  Precautions  Precautions Posterior Hip;Fall  Precaution Comments Pt able to state 3/3 THA precautions, but requires min A/cues to adhere to them  Restrictions  Other Position/Activity Restrictions WBAT  ADL  Toilet Transfer Min guard  Toilet Transfer Method Sit to stand;Stand pivot  Toilet Transfer Equipment Raised toilet seat with arms (or 3-in-1 over toilet)  Tub/Shower Transfer Min guard  Tub/Shower Transfer Method Stand pivot;Ambulating  Psychologist, educational Walk in Retail buyer  Transfers/Ambulation Related to ADLs min guard assist  ADL Comments Pt and family report they will assist pt with LB ADLs and therefore, she does not require AE instruction.  Did instruct her on use and acquisition of reacher.  Discussed options for shower transfers.  Practiced use of 3in1 commode; however, the standard 3in1 commode is too narrow for pt.  Her hip width is greater than that of the commode.  She will need an x-wide 3in1.  Discussed options for shower seats.  She will use a fold down chair.  Practiced shower transfer with min guard assist.  Discussed THA precautions and implications for function  Cognition  Arousal/Alertness Awake/alert  Behavior During Therapy WFL for tasks assessed/performed  Overall Cognitive Status Within Functional Limits for tasks assessed  Bed Mobility  Bed Mobility Not assessed  Transfers  Transfers Sit to Stand;Stand to Sit  Sit to Stand 4: Min guard;With upper extremity assist;From chair/3-in-1;From toilet  Stand to Sit 4: Min guard;With upper extremity assist;To chair/3-in-1;To toilet  Details for Transfer Assistance  cues for hand placement and technique  OT - End of Session  Equipment Utilized During Treatment Rolling walker  Activity Tolerance Patient tolerated treatment well  Patient left in chair;with call bell/phone within reach;with family/visitor present  OT Assessment/Plan  OT Plan Discharge plan remains appropriate  OT Frequency Min 2X/week  Follow Up Recommendations No OT follow up;Supervision/Assistance - 24 hour  OT Equipment 3 in 1 bedside comode (x-wide)  OT Goal Progression  Progress towards OT goals Progressing toward goals  ADL Goals  Pt Will Perform Grooming with supervision;standing  Pt Will Perform Lower Body Bathing with supervision;with adaptive equipment;sit to/from stand  Pt Will Perform Lower Body Dressing with supervision;sit to/from stand;with adaptive equipment  Pt Will Transfer to Toilet with supervision;ambulating;regular height toilet;bedside commode;grab bars  Pt Will Perform Toileting - Clothing Manipulation and hygiene with supervision;sit to/from stand  Pt Will Perform Tub/Shower Transfer Shower transfer;with supervision;ambulating;shower seat;grab bars;rolling walker  Additional ADL Goal #1 Pt will be independent with THA precautions  OT General Charges  $OT Visit 1 Procedure  OT Treatments  $Self Care/Home Management  38-52 mins   [employer], OTR/L [phone]

## 2013-07-09 NOTE — Progress Notes (Signed)
Physical Therapy Treatment Patient Details Name: Kelly [name] MRN: [id number] DOB: 1946-04-19 Today's Date: 07/09/2013 Time: 4098-1191 PT Time Calculation (min): 30 min  PT Assessment / Plan / Recommendation  History of Present Illness This 67 y.o. female admitted for elective Rt. THA posterior approach   PT Comments   Pt ltd by c/o mild nausea.    Follow Up Recommendations  Home health PT     Does the patient have the potential to tolerate intense rehabilitation     Barriers to Discharge        Equipment Recommendations  Rolling walker with 5" wheels    Recommendations for Other Services OT consult  Frequency 7X/week   Progress towards PT Goals Progress towards PT goals: Progressing toward goals  Plan Current plan remains appropriate    Precautions / Restrictions Precautions Precautions: Posterior Hip;Fall Precaution Comments: Pt able to state 3/3 THA precautions, but requires min A/cues to adhere to them Restrictions Weight Bearing Restrictions: No Other Position/Activity Restrictions: WBAT   Pertinent Vitals/Pain 4/10 with activity; premed, ice pack provided    Mobility  Bed Mobility Bed Mobility: Not assessed Supine to Sit: 4: Min assist;3: Mod assist Details for Bed Mobility Assistance: cues for sequence, use of R LE to self assist and adherence to THP Transfers Transfers: Stand to Sit;Sit to Stand Sit to Stand: 4: Min guard;With upper extremity assist;From chair/3-in-1;From toilet Stand to Sit: 4: Min guard;With upper extremity assist;To chair/3-in-1;To toilet Details for Transfer Assistance: cues for hand placement and technique Ambulation/Gait Ambulation/Gait Assistance: 4: Min assist;4: Min guard Ambulation Distance (Feet): 40 Feet (x2) Assistive device: Rolling walker Ambulation/Gait Assistance Details: cues for posture, sequence and position from RW Gait Pattern: Step-to pattern;Decreased step length - right;Decreased step length - left;Shuffle;Trunk  flexed Stairs: No    Exercises Total Joint Exercises Ankle Circles/Pumps: AROM;15 reps;Supine;Both Quad Sets: AROM;Supine;Both;15 reps Heel Slides: AAROM;Supine;Right;20 reps Hip ABduction/ADduction: AAROM;Right;Supine;20 reps   PT Diagnosis:    PT Problem List:   PT Treatment Interventions:     PT Goals (current goals can now be found in the care plan section) Acute Rehab PT Goals Patient Stated Goal: Regain independence PT Goal Formulation: With patient Time For Goal Achievement: 07/15/13 Potential to Achieve Goals: Good  Visit Information  Last PT Received On: 07/09/13 Assistance Needed: +1 History of Present Illness: This 67 y.o. female admitted for elective Rt. THA posterior approach    Subjective Data  Subjective: Doing much better than yesterday Patient Stated Goal: Regain independence   Cognition  Cognition Arousal/Alertness: Awake/alert Behavior During Therapy: WFL for tasks assessed/performed Overall Cognitive Status: Within Functional Limits for tasks assessed    Balance     End of Session PT - End of Session Equipment Utilized During Treatment: Gait belt Activity Tolerance: Patient tolerated treatment well Patient left: in chair;with call bell/phone within reach;with family/visitor present Nurse Communication: Mobility status   GP     Pierre Dellarocco 07/09/2013, 1:12 PM

## 2013-07-09 NOTE — Evaluation (Signed)
Occupational Therapy Evaluation Patient Details Name: EMERSYNN DEATLEY MRN: [id number] DOB: [birth year] Today's Date: 07/09/2013 Time: 4098-1191 OT Time Calculation (min): 39 min  OT Assessment / Plan / Recommendation History of present illness This 67 y.o. female admitted for elective Rt. THA posterior approach   Clinical Impression   Patient is s/p THA posterior approach, Rt hip surgery resulting in functional limitations due to the deficits listed below (see OT Problem List). Family very supportive and pt motivated, anticipate good progress Patient will benefit from skilled OT to increase their safety and independence with ADL and functional mobility for ADL to allow facilitate discharge to venue listed below.     OT Assessment  Patient needs continued OT Services    Follow Up Recommendations  No OT follow up;Supervision/Assistance - 24 hour    Barriers to Discharge      Equipment Recommendations  3 in 1 bedside comode (x-wide)    Recommendations for Other Services    Frequency  Min 2X/week    Precautions / Restrictions Precautions Precautions: Posterior Hip;Fall Precaution Comments: Pt able to state 3/3 THA precautions, but requires min A/cues to adhere to them Restrictions Other Position/Activity Restrictions: WBAT   Pertinent Vitals/Pain     ADL  Eating/Feeding: Independent Where Assessed - Eating/Feeding: Chair Grooming: Wash/dry hands;Wash/dry face;Teeth care;Min guard Where Assessed - Grooming: Supported standing Upper Body Bathing: Supervision/safety Where Assessed - Upper Body Bathing: Unsupported sitting Lower Body Bathing: Moderate assistance Where Assessed - Lower Body Bathing: Supported sit to stand Upper Body Dressing: Set up;Supervision/safety Where Assessed - Upper Body Dressing: Unsupported sitting Lower Body Dressing: Maximal assistance Where Assessed - Lower Body Dressing: Supported sit to stand Toilet Transfer: Hydrographic surveyor Method:  Sit to stand;Stand pivot Acupuncturist: Comfort Height commode and grab bar Toileting - Architect and Hygiene: Minimal assistance Where Assessed - Engineer, mining and Hygiene: Standing  Equipment Used: Rolling walker;Reacher Transfers/Ambulation Related to ADLs: min guard assist ADL Comments:  Pt instructed in safety with toilet transfer.  Pt able to perform toilet transfer onto comfort height commode with min guard assist, but is dependent on use of grab bar.  She does not have anything to use as a UE support when moving sit to stand from her commode at home and therefore, will need a 3in1 commode for home use.  She may require an x-wide 3in1 as her hip girth may be too wide for a standard 3in1.  Will asses next session    OT Diagnosis: Generalized weakness;Acute pain  OT Problem List: Decreased strength;Decreased activity tolerance;Decreased knowledge of use of DME or AE;Obesity;Pain;Decreased knowledge of precautions OT Treatment Interventions: Self-care/ADL training;DME and/or AE instruction;Therapeutic activities;Patient/family education   OT Goals(Current goals can be found in the care plan section) Acute Rehab OT Goals Patient Stated Goal: Regain independence OT Goal Formulation: With patient/family Time For Goal Achievement: 07/16/13 Potential to Achieve Goals: Good ADL Goals Pt Will Perform Grooming: with supervision;standing Pt Will Perform Lower Body Bathing: with supervision;with adaptive equipment;sit to/from stand Pt Will Perform Lower Body Dressing: with supervision;sit to/from stand;with adaptive equipment Pt Will Transfer to Toilet: with supervision;ambulating;regular height toilet;bedside commode;grab bars Pt Will Perform Toileting - Clothing Manipulation and hygiene: with supervision;sit to/from stand Pt Will Perform Tub/Shower Transfer: Shower transfer;with supervision;ambulating;shower seat;grab bars;rolling walker Additional ADL  Goal #1: Pt will be independent with THA precautions  Visit Information  Last OT Received On: 07/09/13 Assistance Needed: +1 History of Present Illness: This 67 y.o. female admitted for  elective Rt. THA posterior approach       Prior Functioning     Home Living Family/patient expects to be discharged to:: Private residence Living Arrangements: Spouse/significant other Available Help at Discharge: Family Type of Home: House Home Access: Stairs to enter Secretary/administrator of Steps: 3 Entrance Stairs-Rails: Right Home Layout: One level Home Equipment: Cane - single point;Shower seat - built in;Grab bars - tub/shower;Hand held shower head Prior Function Level of Independence: Independent;Independent with assistive device(s)         Vision/Perception     Cognition  Cognition Arousal/Alertness: Awake/alert Behavior During Therapy: WFL for tasks assessed/performed Overall Cognitive Status: Within Functional Limits for tasks assessed    Extremity/Trunk Assessment Upper Extremity Assessment Upper Extremity Assessment: Overall WFL for tasks assessed     Mobility Bed Mobility Bed Mobility: Not assessed Transfers Transfers: Sit to Stand;Stand to Sit Sit to Stand: 4: Min guard;With upper extremity assist;From chair/3-in-1;From toilet Stand to Sit: 4: Min guard;With upper extremity assist;To chair/3-in-1;To toilet Details for Transfer Assistance: cues for hand placement and technique     Exercise     Balance     End of Session OT - End of Session Equipment Utilized During Treatment: Rolling walker Activity Tolerance: Patient tolerated treatment well Patient left: in chair;with call bell/phone within reach;with family/visitor present  GO     Amberlea Spagnuolo, Ursula Alert M 07/09/2013, 12:50 PM

## 2013-07-09 NOTE — Plan of Care (Signed)
Problem: Phase II Progression Outcomes Goal: Tolerating diet Outcome: Not Progressing Pt wants to be able to walk PT prior to d/c of foley

## 2013-08-12 ENCOUNTER — Encounter (HOSPITAL_COMMUNITY): Payer: Self-pay | Admitting: Pharmacy Technician

## 2013-08-12 ENCOUNTER — Encounter (HOSPITAL_COMMUNITY): Payer: Self-pay

## 2013-08-12 ENCOUNTER — Encounter (HOSPITAL_COMMUNITY)
Admission: RE | Admit: 2013-08-12 | Discharge: 2013-08-12 | Disposition: A | Discharge status: Home / Self Care | Payer: BC Managed Care – PPO | Source: Ambulatory Visit | Attending: Orthopedic Surgery | Admitting: Orthopedic Surgery

## 2013-08-12 HISTORY — DX: Essential (primary) hypertension: I10

## 2013-08-12 LAB — CBC
Hemoglobin: 11 g/dL — ABNORMAL LOW (ref 12.0–15.0)
MCH: 28.1 pg (ref 26.0–34.0)
Platelets: 413 10*3/uL — ABNORMAL HIGH (ref 150–400)
RBC: 3.91 MIL/uL (ref 3.87–5.11)
WBC: 11.2 10*3/uL — ABNORMAL HIGH (ref 4.0–10.5)

## 2013-08-12 LAB — BASIC METABOLIC PANEL
CO2: 26 mEq/L (ref 19–32)
Calcium: 9.6 mg/dL (ref 8.4–10.5)
Potassium: 4.6 mEq/L (ref 3.5–5.1)
Sodium: 138 mEq/L (ref 135–145)

## 2013-08-12 NOTE — Progress Notes (Signed)
ekg 06-16-2013 dr Evlyn Kanner on chart Chest xray 2 view  06-30-13 epic

## 2013-08-12 NOTE — Progress Notes (Signed)
Toniann Fail cayton called darlene reed rn and said dr Lequita Halt could not put orders in today, do labs per anesthesia.

## 2013-08-12 NOTE — Progress Notes (Signed)
Paged drew perkins pa, no response, called office and asked to have dr Lequita Halt paged for pre op orders pt heer now for pre op

## 2013-08-12 NOTE — Patient Instructions (Addendum)
20 TAL NEER  08/12/2013   Your procedure is scheduled on: 08-15-2013  Report to [facility] Stay [facility] at  200 pm  Call this number if you have problems the morning of surgery [phone]   Remember:   Do not eat food  :After Midnight.   clear liquids midnight until 1025 am, then nothing by mouth.   Take these medicines the morning of surgery with A SIP OF WATER: no meds to take                                SEE [place] PREPARING FOR SURGERY SHEET   Do not wear jewelry, make-up.  Do not wear lotions, powders, or perfumes. You may wear deodorant.   Men may shave face and neck.  Do not bring valuables to the hospital. [facility] IS NOT RESPONSIBLE FOR VALUEABLES.  Contacts, dentures or bridgework may not be worn into surgery.  Leave suitcase in the car. After surgery it may be brought to your room.  For patients admitted to the hospital, checkout time is 11:00 AM the day of discharge.   Patients discharged the day of surgery will not be allowed to drive home.  Name and phone number of your driver:  Special Instructions: N/A   Please read over the following fact sheets that you were given:   Call Cain Sieve RN pre op nurse if needed [phone]    FAILURE TO FOLLOW THESE INSTRUCTIONS MAY RESULT IN THE CANCELLATION OF YOUR SURGERY.  PATIENT SIGNATURE___________________________________________  NURSE SIGNATURE_____________________________________________

## 2013-08-15 ENCOUNTER — Encounter (HOSPITAL_COMMUNITY): Payer: Self-pay | Admitting: Anesthesiology

## 2013-08-15 ENCOUNTER — Encounter (HOSPITAL_COMMUNITY)
Admission: RE | Disposition: A | Discharge status: Home / Self Care | Payer: Self-pay | Source: Ambulatory Visit | Attending: Orthopedic Surgery

## 2013-08-15 ENCOUNTER — Observation Stay (HOSPITAL_COMMUNITY)
Admission: RE | Admit: 2013-08-15 | Discharge: 2013-08-16 | Disposition: A | Discharge status: Home / Self Care | Payer: BC Managed Care – PPO | Source: Ambulatory Visit | Attending: Orthopedic Surgery | Admitting: Orthopedic Surgery

## 2013-08-15 ENCOUNTER — Encounter (HOSPITAL_COMMUNITY): Payer: Self-pay

## 2013-08-15 ENCOUNTER — Ambulatory Visit (HOSPITAL_COMMUNITY): Payer: BC Managed Care – PPO | Admitting: Anesthesiology

## 2013-08-15 DIAGNOSIS — Z96649 Presence of unspecified artificial hip joint: Secondary | ICD-10-CM | POA: Insufficient documentation

## 2013-08-15 DIAGNOSIS — E119 Type 2 diabetes mellitus without complications: Secondary | ICD-10-CM | POA: Insufficient documentation

## 2013-08-15 DIAGNOSIS — L089 Local infection of the skin and subcutaneous tissue, unspecified: Secondary | ICD-10-CM | POA: Diagnosis present

## 2013-08-15 DIAGNOSIS — Y831 Surgical operation with implant of artificial internal device as the cause of abnormal reaction of the patient, or of later complication, without mention of misadventure at the time of the procedure: Secondary | ICD-10-CM | POA: Insufficient documentation

## 2013-08-15 DIAGNOSIS — T8140XA Infection following a procedure, unspecified, initial encounter: Principal | ICD-10-CM | POA: Insufficient documentation

## 2013-08-15 DIAGNOSIS — Z7982 Long term (current) use of aspirin: Secondary | ICD-10-CM | POA: Insufficient documentation

## 2013-08-15 DIAGNOSIS — I10 Essential (primary) hypertension: Secondary | ICD-10-CM | POA: Insufficient documentation

## 2013-08-15 HISTORY — PX: I & D EXTREMITY: SHX5045

## 2013-08-15 LAB — GLUCOSE, CAPILLARY: Glucose-Capillary: 117 mg/dL — ABNORMAL HIGH (ref 70–99)

## 2013-08-15 SURGERY — IRRIGATION AND DEBRIDEMENT EXTREMITY
Anesthesia: Spinal | Site: Hip | Laterality: Right | Wound class: Dirty or Infected

## 2013-08-15 MED ORDER — ONDANSETRON HCL 4 MG/2ML IJ SOLN
INTRAMUSCULAR | Status: DC | PRN
  Administered 2013-08-15: 4 mg via INTRAVENOUS

## 2013-08-15 MED ORDER — SODIUM CHLORIDE 0.9 % IR SOLN
Status: DC | PRN
  Administered 2013-08-15: 1000 mL

## 2013-08-15 MED ORDER — ONDANSETRON HCL 4 MG PO TABS: 4.0000 mg | ORAL_TABLET | Freq: Four times a day (QID) | ORAL | Status: DC | PRN

## 2013-08-15 MED ORDER — METOCLOPRAMIDE HCL 10 MG PO TABS
5.0000 mg | ORAL_TABLET | Freq: Three times a day (TID) | ORAL | Status: DC | PRN
  Administered 2013-08-16: 10 mg via ORAL
  Filled 2013-08-15: qty 1

## 2013-08-15 MED ORDER — PHENYLEPHRINE HCL 10 MG/ML IJ SOLN
INTRAMUSCULAR | Status: DC | PRN
  Administered 2013-08-15 (×3): 80 ug via INTRAVENOUS

## 2013-08-15 MED ORDER — RAMIPRIL 10 MG PO CAPS
20.0000 mg | ORAL_CAPSULE | Freq: Every evening | ORAL | Status: DC
Start: 2013-08-16 — End: 2013-08-16
  Filled 2013-08-15: qty 2

## 2013-08-15 MED ORDER — INSULIN ASPART 100 UNIT/ML ~~LOC~~ SOLN
0.0000 [IU] | Freq: Three times a day (TID) | SUBCUTANEOUS | Status: DC
  Administered 2013-08-16: 2 [IU] via SUBCUTANEOUS

## 2013-08-15 MED ORDER — SIMVASTATIN 40 MG PO TABS
40.0000 mg | ORAL_TABLET | Freq: Every evening | ORAL | Status: DC
Start: 2013-08-16 — End: 2013-08-16
  Filled 2013-08-15: qty 1

## 2013-08-15 MED ORDER — SODIUM CHLORIDE 0.9 % IV SOLN
INTRAVENOUS | Status: DC
  Administered 2013-08-15: 19:00:00 via INTRAVENOUS

## 2013-08-15 MED ORDER — MIDAZOLAM HCL 5 MG/5ML IJ SOLN
INTRAMUSCULAR | Status: DC | PRN
  Administered 2013-08-15: 2 mg via INTRAVENOUS

## 2013-08-15 MED ORDER — MEPERIDINE HCL 50 MG/ML IJ SOLN: 6.2500 mg | INTRAMUSCULAR | Status: DC | PRN

## 2013-08-15 MED ORDER — ENOXAPARIN SODIUM 40 MG/0.4ML ~~LOC~~ SOLN
40.0000 mg | SUBCUTANEOUS | Status: DC
  Administered 2013-08-16: 40 mg via SUBCUTANEOUS
  Filled 2013-08-15: qty 0.4

## 2013-08-15 MED ORDER — ONDANSETRON HCL 4 MG/2ML IJ SOLN
4.0000 mg | Freq: Four times a day (QID) | INTRAMUSCULAR | Status: DC | PRN
  Administered 2013-08-16: 4 mg via INTRAVENOUS
  Filled 2013-08-15: qty 2

## 2013-08-15 MED ORDER — CEFAZOLIN SODIUM-DEXTROSE 2-3 GM-% IV SOLR
2.0000 g | Freq: Four times a day (QID) | INTRAVENOUS | Status: AC
  Administered 2013-08-15 – 2013-08-16 (×3): 2 g via INTRAVENOUS
  Filled 2013-08-15 (×3): qty 50

## 2013-08-15 MED ORDER — METOCLOPRAMIDE HCL 5 MG/ML IJ SOLN: 5.0000 mg | Freq: Three times a day (TID) | INTRAMUSCULAR | Status: DC | PRN

## 2013-08-15 MED ORDER — PHENYLEPHRINE HCL 10 MG/ML IJ SOLN
10.0000 mg | INTRAVENOUS | Status: DC | PRN
  Administered 2013-08-15: 50 ug/min via INTRAVENOUS

## 2013-08-15 MED ORDER — FENTANYL CITRATE 0.05 MG/ML IJ SOLN: 25.0000 ug | INTRAMUSCULAR | Status: DC | PRN

## 2013-08-15 MED ORDER — PROMETHAZINE HCL 25 MG/ML IJ SOLN: 6.2500 mg | INTRAMUSCULAR | Status: DC | PRN

## 2013-08-15 MED ORDER — LIDOCAINE HCL (CARDIAC) 20 MG/ML IV SOLN
INTRAVENOUS | Status: DC | PRN
  Administered 2013-08-15: 100 mg via INTRAVENOUS

## 2013-08-15 MED ORDER — 0.9 % SODIUM CHLORIDE (POUR BTL) OPTIME
TOPICAL | Status: DC | PRN
  Administered 2013-08-15: 1000 mL

## 2013-08-15 MED ORDER — LACTATED RINGERS IV SOLN
INTRAVENOUS | Status: DC
  Administered 2013-08-15: 1000 mL via INTRAVENOUS

## 2013-08-15 MED ORDER — CEFAZOLIN SODIUM-DEXTROSE 2-3 GM-% IV SOLR
2.0000 g | INTRAVENOUS | Status: AC
  Administered 2013-08-15: 2 g via INTRAVENOUS

## 2013-08-15 MED ORDER — CEFAZOLIN SODIUM-DEXTROSE 2-3 GM-% IV SOLR
INTRAVENOUS | Status: AC
  Filled 2013-08-15: qty 50

## 2013-08-15 MED ORDER — OXYCODONE HCL 5 MG PO TABS
5.0000 mg | ORAL_TABLET | ORAL | Status: DC | PRN
  Administered 2013-08-15 – 2013-08-16 (×2): 10 mg via ORAL
  Filled 2013-08-15 (×2): qty 2

## 2013-08-15 MED ORDER — HYDROMORPHONE HCL PF 1 MG/ML IJ SOLN
0.5000 mg | INTRAMUSCULAR | Status: DC | PRN
  Administered 2013-08-15 (×2): 1 mg via INTRAVENOUS
  Filled 2013-08-15 (×2): qty 1

## 2013-08-15 MED ORDER — PROPOFOL INFUSION 10 MG/ML OPTIME
INTRAVENOUS | Status: DC | PRN
  Administered 2013-08-15: 140 ug/kg/min via INTRAVENOUS

## 2013-08-15 MED ORDER — EXENATIDE 10 MCG/0.04ML ~~LOC~~ SOPN
10.0000 ug | PEN_INJECTOR | Freq: Every day | SUBCUTANEOUS | Status: DC
Start: 2013-08-16 — End: 2013-08-16

## 2013-08-15 MED ORDER — TRAMADOL HCL 50 MG PO TABS: 50.0000 mg | ORAL_TABLET | Freq: Four times a day (QID) | ORAL | Status: DC | PRN

## 2013-08-15 SURGICAL SUPPLY — 53 items
BAG SPEC THK2 15X12 ZIP CLS (MISCELLANEOUS)
BAG ZIPLOCK 12X15 (MISCELLANEOUS) IMPLANT
BANDAGE ESMARK 6X9 LF (GAUZE/BANDAGES/DRESSINGS) IMPLANT
BANDAGE GAUZE ELAST BULKY 4 IN (GAUZE/BANDAGES/DRESSINGS) IMPLANT
BNDG CMPR 9X6 STRL LF SNTH (GAUZE/BANDAGES/DRESSINGS)
BNDG ESMARK 6X9 LF (GAUZE/BANDAGES/DRESSINGS)
CLOTH BEACON ORANGE TIMEOUT ST (SAFETY) ×2 IMPLANT
CONT SPECI 4OZ STER CLIK (MISCELLANEOUS) IMPLANT
CUFF TOURN SGL QUICK 18 (TOURNIQUET CUFF) IMPLANT
CUFF TOURN SGL QUICK 24 (TOURNIQUET CUFF)
CUFF TOURN SGL QUICK 34 (TOURNIQUET CUFF)
CUFF TRNQT CYL 24X4X40X1 (TOURNIQUET CUFF) IMPLANT
CUFF TRNQT CYL 34X4X40X1 (TOURNIQUET CUFF) IMPLANT
DRAIN PENROSE 18X1/2 LTX STRL (DRAIN) IMPLANT
DRAPE EXTREMITY T 121X128X90 (DRAPE) IMPLANT
DRAPE INCISE IOBAN 66X45 STRL (DRAPES) IMPLANT
DRAPE ORTHO SPLIT 77X108 STRL (DRAPES) ×4
DRAPE SURG ORHT 6 SPLT 77X108 (DRAPES) ×2 IMPLANT
DRAPE U-SHAPE 47X51 STRL (DRAPES) ×2 IMPLANT
DRSG ADAPTIC 3X8 NADH LF (GAUZE/BANDAGES/DRESSINGS) IMPLANT
DRSG MEPILEX BORDER 4X12 (GAUZE/BANDAGES/DRESSINGS) ×2 IMPLANT
DRSG MEPILEX BORDER 4X4 (GAUZE/BANDAGES/DRESSINGS) ×2 IMPLANT
DRSG MEPILEX BORDER 4X8 (GAUZE/BANDAGES/DRESSINGS) IMPLANT
DRSG PAD ABDOMINAL 8X10 ST (GAUZE/BANDAGES/DRESSINGS) IMPLANT
DURAPREP 26ML APPLICATOR (WOUND CARE) ×2 IMPLANT
ELECT REM PT RETURN 9FT ADLT (ELECTROSURGICAL) ×2
ELECTRODE REM PT RTRN 9FT ADLT (ELECTROSURGICAL) ×1 IMPLANT
EVACUATOR 1/8 PVC DRAIN (DRAIN) ×2 IMPLANT
GLOVE BIO SURGEON STRL SZ7.5 (GLOVE) ×4 IMPLANT
GLOVE BIO SURGEON STRL SZ8 (GLOVE) ×6 IMPLANT
GLOVE BIOGEL PI IND STRL 8 (GLOVE) ×4 IMPLANT
GLOVE BIOGEL PI INDICATOR 8 (GLOVE) ×4
GOWN STRL NON-REIN LRG LVL3 (GOWN DISPOSABLE) IMPLANT
GOWN STRL REIN XL XLG (GOWN DISPOSABLE) ×4 IMPLANT
HANDPIECE INTERPULSE COAX TIP (DISPOSABLE) ×1
KIT BASIN OR (CUSTOM PROCEDURE TRAY) ×4 IMPLANT
MANIFOLD NEPTUNE II (INSTRUMENTS) ×2 IMPLANT
PACK TOTAL JOINT (CUSTOM PROCEDURE TRAY) ×2 IMPLANT
PAD CAST 4YDX4 CTTN HI CHSV (CAST SUPPLIES) IMPLANT
PADDING CAST COTTON 4X4 STRL (CAST SUPPLIES)
POSITIONER SURGICAL ARM (MISCELLANEOUS) ×2 IMPLANT
SET HNDPC FAN SPRY TIP SCT (DISPOSABLE) ×1 IMPLANT
SPONGE GAUZE 4X4 12PLY (GAUZE/BANDAGES/DRESSINGS) IMPLANT
STAPLER VISISTAT 35W (STAPLE) ×2 IMPLANT
SUT MNCRL AB 4-0 PS2 18 (SUTURE) IMPLANT
SUT VIC AB 1 CT1 27 (SUTURE) ×4
SUT VIC AB 1 CT1 27XBRD ANTBC (SUTURE) ×2 IMPLANT
SUT VIC AB 2-0 CT1 27 (SUTURE) ×4
SUT VIC AB 2-0 CT1 TAPERPNT 27 (SUTURE) ×2 IMPLANT
SWAB COLLECTION DEVICE MRSA (MISCELLANEOUS) ×2 IMPLANT
SYR CONTROL 10ML LL (SYRINGE) ×2 IMPLANT
TOWEL OR 17X26 10 PK STRL BLUE (TOWEL DISPOSABLE) ×4 IMPLANT
TUBE ANAEROBIC SPECIMEN COL (MISCELLANEOUS) ×2 IMPLANT

## 2013-08-15 NOTE — Transfer of Care (Signed)
Immediate Anesthesia Transfer of Care Note  Patient: Kelly [name]  Procedure(s) Performed: Procedure(s) (LRB): INCISION AND DRAINAGE RIGHT HIP INFECTION (PREVIOUS RIGHT TOTAL HIP REPLACEMENT) (Right)  Patient Location: PACU  Anesthesia Type: Spinal  Level of Consciousness: sedated, patient cooperative and responds to stimulaton  Airway & Oxygen Therapy: Patient Spontanous Breathing and Patient connected to face mask oxgen  Post-op Assessment: Report given to PACU RN and Post -op Vital signs reviewed and stable  Post vital signs: Reviewed and stable  Complications: No apparent anesthesia complications

## 2013-08-15 NOTE — H&P (Signed)
Kelly [name] is an 67 y.o. female.   Chief Complaint: Draining right hip wound HPI: Kelly [name] is a 67 yo female who underwent right THA on 07/07/13 without complications. She had a small amount of serous drainage from her mid-incision upon her post-op visit which was 2 weeks after surgery. She had been on oral antibiotics for preventive measures but had not developed any fever, chills or systemic symptoms. She presented to the office 3 days ago with an area approximately 6 inches long with necrotic wound edges and serous drainage. There was no erythema or overt infection.She has a significant amount of subcutaneous fat and it was felt that this represented fat necrosis. She presents today for formal irrigation and debridement. She has not had fever, chills or systemic signs of infection.  Past Medical History  Diagnosis Date  . Diabetes mellitus without complication   . Arthritis   . Hypertension   . Heart murmur     Past Surgical History  Procedure Laterality Date  . Total hip arthroplasty Right 07/07/2013    Procedure: RIGHT TOTAL HIP ARTHROPLASTY;  Surgeon: Loanne Drilling, MD;  Location: [facility] ORS;  Service: Orthopedics;  Laterality: Right;    History reviewed. No pertinent family history. Social History:  reports that she has never smoked. She has never used smokeless tobacco. She reports that she does not drink alcohol or use illicit drugs.  Allergies:  Allergies  Allergen Reactions  . Robaxin [Methocarbamol] Nausea And Vomiting    Medications Prior to Admission  Medication Sig Dispense Refill  . aspirin 81 MG tablet Take 81 mg by mouth daily.      Marland Kitchen exenatide (BYETTA) 10 MCG/0.04ML SOPN Inject 10 mcg into the skin daily.      . ramipril (ALTACE) 10 MG capsule Take 20 mg by mouth every evening.      . simvastatin (ZOCOR) 40 MG tablet Take 40 mg by mouth every evening.      . Vitamin D, Ergocalciferol, (DRISDOL) 50000 UNITS CAPS capsule Take 50,000 Units by mouth every 7  (seven) days.      . pioglitazone-metformin (ACTOPLUS MET) 15-850 MG per tablet Take 1 tablet by mouth every evening.        Results for orders placed during the hospital encounter of 08/15/13 (from the past 48 hour(s))  GLUCOSE, CAPILLARY     Status: Abnormal   Collection Time    08/15/13  2:15 PM      Result Value Range   Glucose-Capillary 117 (*) 70 - 99 mg/dL   No results found.  ROS  Blood pressure 191/60, pulse 88, temperature 98.2 F (36.8 C), temperature source Oral, resp. rate 20, SpO2 95.00%. Physical Exam  Physical Examination: General appearance - alert, well appearing, and in no distress Mental status - alert, oriented to person, place, and time Neck - supple, no significant adenopathy Chest - clear to auscultation, no wheezes, rales or rhonchi, symmetric air entry Heart - normal rate, regular rhythm, normal S1, S2, no murmurs, rubs, clicks or gallops Abdomen - soft, nontender, nondistended, no masses or organomegaly Approximately 6 inch area mid incision that is open to subQ tissues.No erythema or purulence   Assessment/Plan Right hip Subcutaneous soft tissue infection- Plan irrigation and deridement. Discussed in detail with patient who elects to proceed.  Loanne Drilling 08/15/2013, 3:27 PM

## 2013-08-15 NOTE — Brief Op Note (Signed)
08/15/2013  5:37 PM  PATIENT:  Domenick Bookbinder  67 y.o. female  PRE-OPERATIVE DIAGNOSIS:  RIGHT HIP WOUND INFECTION  POST-OPERATIVE DIAGNOSIS:  RIGHT HIP WOUND INFECTION  PROCEDURE:  Procedure(s): INCISION AND DRAINAGE RIGHT HIP INFECTION (PREVIOUS RIGHT TOTAL HIP REPLACEMENT) (Right)  SURGEON:  Surgeon(s) and Role:    * Loanne Drilling, MD - Primary  PHYSICIAN ASSISTANT:   ASSISTANTS: none   ANESTHESIA:   spinal  EBL:  Total I/O In: 1000 [I.V.:1000] Out: -   BLOOD ADMINISTERED:none  DRAINS: (Medium) Hemovact drain(s) in the right hip with  Suction Open   LOCAL MEDICATIONS USED:  NONE  COUNTS:  YES  TOURNIQUET:  * No tourniquets in log *  DICTATION: .Other Dictation: Dictation Number [id number]  PLAN OF CARE: Admit for overnight observation  PATIENT DISPOSITION:  PACU - hemodynamically stable.

## 2013-08-15 NOTE — Preoperative (Signed)
Beta Blockers   Reason not to administer Beta Blockers:Not Applicable 

## 2013-08-15 NOTE — Interval H&P Note (Signed)
History and Physical Interval Note:  08/15/2013 3:37 PM  Kelly [name]  has presented today for surgery, with the diagnosis of RIGHT HIP WOUND INFECTION  The various methods of treatment have been discussed with the patient and family. After consideration of risks, benefits and other options for treatment, the patient has consented to  Procedure(s): INCISION AND DRAINAGE RIGHT HIP INFECTION (PREVIOUS RIGHT TOTAL HIP REPLACEMENT) (Right) as a surgical intervention .  The patient's history has been reviewed, patient examined, no change in status, stable for surgery.  I have reviewed the patient's chart and labs.  Questions were answered to the patient's satisfaction.     Loanne Drilling

## 2013-08-15 NOTE — Anesthesia Postprocedure Evaluation (Signed)
  Anesthesia Post-op Note  Patient: Kelly [name]  Procedure(s) Performed: Procedure(s) (LRB): INCISION AND DRAINAGE RIGHT HIP INFECTION (PREVIOUS RIGHT TOTAL HIP REPLACEMENT) (Right)  Patient Location: PACU  Anesthesia Type: Spinal  Level of Consciousness: awake and alert   Airway and Oxygen Therapy: Patient Spontanous Breathing  Post-op Pain: mild  Post-op Assessment: Post-op Vital signs reviewed, Patient's Cardiovascular Status Stable, Respiratory Function Stable, Patent Airway and No signs of Nausea or vomiting  Last Vitals:  Filed Vitals:   08/15/13 1800  BP: 108/75  Pulse: 68  Temp:   Resp: 14    Post-op Vital Signs: stable   Complications: No apparent anesthesia complications

## 2013-08-15 NOTE — Anesthesia Preprocedure Evaluation (Signed)
Anesthesia Evaluation  Patient identified by MRN, date of birth, ID band Patient awake    Reviewed: Allergy & Precautions, H&P , NPO status , Patient's Chart, lab work & pertinent test results  Airway Mallampati: II TM Distance: >3 FB Neck ROM: Full    Dental no notable dental hx.    Pulmonary neg pulmonary ROS,  breath sounds clear to auscultation  Pulmonary exam normal       Cardiovascular hypertension, Pt. on medications Rhythm:Regular Rate:Normal     Neuro/Psych negative neurological ROS  negative psych ROS   GI/Hepatic negative GI ROS, Neg liver ROS,   Endo/Other  diabetes, Type 2, Oral Hypoglycemic AgentsMorbid obesity  Renal/GU negative Renal ROS  negative genitourinary   Musculoskeletal negative musculoskeletal ROS (+)   Abdominal   Peds negative pediatric ROS (+)  Hematology negative hematology ROS (+)   Anesthesia Other Findings   Reproductive/Obstetrics negative OB ROS                           Anesthesia Physical  Anesthesia Plan  ASA: III  Anesthesia Plan: Spinal   Post-op Pain Management:    Induction:   Airway Management Planned: Simple Face Mask  Additional Equipment:   Intra-op Plan:   Post-operative Plan:   Informed Consent: I have reviewed the patients History and Physical, chart, labs and discussed the procedure including the risks, benefits and alternatives for the proposed anesthesia with the patient or authorized representative who has indicated his/her understanding and acceptance.   Dental advisory given  Plan Discussed with: CRNA  Anesthesia Plan Comments:         Anesthesia Quick Evaluation

## 2013-08-16 ENCOUNTER — Encounter (HOSPITAL_COMMUNITY): Payer: Self-pay | Admitting: Orthopedic Surgery

## 2013-08-16 LAB — GLUCOSE, CAPILLARY: Glucose-Capillary: 136 mg/dL — ABNORMAL HIGH (ref 70–99)

## 2013-08-16 MED ORDER — BISACODYL 10 MG RE SUPP: 10.0000 mg | Freq: Every day | RECTAL | Status: DC | PRN

## 2013-08-16 MED ORDER — HYDROMORPHONE HCL PF 1 MG/ML IJ SOLN
0.5000 mg | INTRAMUSCULAR | Status: DC | PRN
  Administered 2013-08-16: 1 mg via INTRAVENOUS
  Administered 2013-08-16: 2 mg via INTRAVENOUS
  Filled 2013-08-16: qty 1

## 2013-08-16 MED ORDER — DOXYCYCLINE HYCLATE 50 MG PO CAPS: 100.0000 mg | ORAL_CAPSULE | Freq: Two times a day (BID) | ORAL | Status: DC

## 2013-08-16 MED ORDER — OXYCODONE HCL 5 MG PO TABS: 5.0000 mg | ORAL_TABLET | ORAL | Status: DC | PRN

## 2013-08-16 MED ORDER — HYDROMORPHONE HCL PF 1 MG/ML IJ SOLN
INTRAMUSCULAR | Status: AC
  Administered 2013-08-16: 2 mg via INTRAVENOUS
  Filled 2013-08-16: qty 2

## 2013-08-16 MED ORDER — CEPHALEXIN 250 MG PO CAPS: 250.0000 mg | ORAL_CAPSULE | Freq: Three times a day (TID) | ORAL | Status: DC

## 2013-08-16 MED ORDER — OXYCODONE HCL 5 MG PO TABS
5.0000 mg | ORAL_TABLET | ORAL | Status: DC | PRN
  Administered 2013-08-16: 10 mg via ORAL
  Administered 2013-08-16 (×3): 15 mg via ORAL
  Filled 2013-08-16 (×4): qty 3

## 2013-08-16 NOTE — Progress Notes (Signed)
Utilization review completed.  

## 2013-08-16 NOTE — Op Note (Signed)
[name], OUTTEN NO.:  [id number]  MEDICAL RECORD NO.:  [id number]  LOCATION:  1607                         FACILITY:  [facility]  PHYSICIAN:  Ollen Gross, M.D.    DATE OF BIRTH:  Jul 14, 1946  DATE OF PROCEDURE:  08/15/2013 DATE OF DISCHARGE:                              OPERATIVE REPORT   PREOPERATIVE DIAGNOSIS:  Right hip superficial wound infection.  POSTOPERATIVE DIAGNOSIS:  Right hip superficial wound infection.  PROCEDURE:  Irrigation and debridement of right hip.  SURGEON:  Ollen Gross, M.D.  ASSISTANT:  No assistant.  ANESTHESIA:  Spinal.  ESTIMATED BLOOD LOSS:  Minimal.  DRAINS:  Hemovac x1.  COMPLICATIONS:  None.  CONDITION:  Stable to recovery.  BRIEF CLINICAL NOTE:  Ms. Kelly [name] is a 67 year old female, had right total hip arthroplasty done on July 07, 2013, without complication.  At her 2-week postop visit, she had a tiny amount of serous drainage without evidence of any erythema or infection.  She was started on antibiotic for preventative purposes.  She came in for her 5-week postop visit last week, was noted to have wound edge necrosis and some skin breakdown.  I did a gentle debridement in the office and we decided to come to the operating room today for formal debridement of the soft tissue.  PROCEDURE IN DETAIL:  After successful administration of spinal anesthetic, the patient was placed in the left lateral decubitus position to right side up and held with the hip positioner.  Right hip was then prepped and draped in usual sterile fashion.  The open area was about 4 inches long.  I ellipsed out the necrotic edges skin all the way into the subcu tissues.  She had a very thick layer of subcu from the skin to her fascia, which was about 8 inches deep.  I ellipsed down to the next layer of subcu.  We had closed it at about 3 or 4 layer with the original procedure.  Fortunately, there was a thick layer overlying the fascia, so  this did not penetrate the joint at all.  Any abnormal fat was removed.  Basically, she had some fat necrosis and necrotic fat was taken out.  There was a small fluid collection present and this fluid was sent for Gram stain culture and sensitivity.  Once I debrided the necrotic fat edges, the rest of the fat looked normal.  We then thoroughly irrigated with pulsatile lavage with saline solution.  I closed with two deep layers with #1 Vicryl and then subcu with interrupted 2-0 Vicryl and the skin with staples.  Note that the Hemovac drain had been placed in the deep tissue to exit distal to the incision. Once the incision was closed, the drains hooked to suction.  The incision was cleaned and dried and a bulky sterile dressing applied.  Note that this was an incisional debridement and that, we did the incision with a 10-blade and also an excisional debridement and then we used a knife to remove the necrotic fatty tissue.  The length of incision once again about 6 inches, so went with 1 inch normal tissue on each side of the incision.  The depths about 4-6 inches.  Ollen Gross, M.D.     FA/MEDQ  D:  08/15/2013  T:  08/16/2013  Job:  [id number]

## 2013-08-16 NOTE — Care Management Note (Signed)
    Page 1 of 1   08/16/2013     1:40:09 PM   CARE MANAGEMENT NOTE 08/16/2013  Patient:  Kelly [name], Kelly [name]   Account Number:  [id number]  Date Initiated:  08/16/2013  Documentation initiated by:  Colleen Can  Subjective/Objective Assessment:   dx: incision & drainage of rt hip wound infection     Action/Plan:   CM spoke with patient. Plans are for her to return to her home in [place] where spouse will be caregiver. She already has equipment and does not require HH services.   Anticipated DC Date:  08/16/2013   Anticipated DC Plan:  HOME/SELF CARE      DC Planning Services  CM consult      Choice offered to / List presented to:             Status of service:  Completed, signed off Medicare Important Message given?   (If response is "NO", the following Medicare IM given date fields will be blank) Date Medicare IM given:   Date Additional Medicare IM given:    Discharge Disposition:  HOME/SELF CARE  Per UR Regulation:    If discussed at Long Length of Stay Meetings, dates discussed:    Comments:

## 2013-08-16 NOTE — Discharge Summary (Signed)
Physician Discharge Summary   Patient ID: Kelly [name] MRN: [id number] DOB/AGE: 07-18-1946 67 y.o.  Admit date: 08/15/2013 Discharge date: 08/16/2013  Primary Diagnosis:  Right hip superficial wound infection.  Admission Diagnoses:  Past Medical History  Diagnosis Date  . Diabetes mellitus without complication   . Arthritis   . Hypertension   . Heart murmur    Discharge Diagnoses:   Principal Problem:   Local infection of skin and subcutaneous tissue  Estimated body mass index is 42.1 kg/(m^2) as calculated from the following:   Height as of this encounter: 5\' 5"  (1.651 m).   Weight as of this encounter: 114.76 kg (253 lb).  Procedure:  Procedure(s) (LRB): INCISION AND DRAINAGE RIGHT HIP INFECTION (PREVIOUS RIGHT TOTAL HIP REPLACEMENT) (Right)   Consults: None  HPI: Ms. Kelly [name] is a 67 year old female, had right  total hip arthroplasty done on July 07, 2013, without complication. At  her 2-week postop visit, she had a tiny amount of serous drainage  without evidence of any erythema or infection. She was started on  antibiotic for preventative purposes. She came in for her 5-week postop  visit last week, was noted to have wound edge necrosis and some skin  breakdown. I did a gentle debridement in the office and we decided to  come to the operating room today for formal debridement of the soft  tissue.  Laboratory Data: Admission on 08/15/2013, Discharged on 08/16/2013  Component Date Value Range Status  . Glucose-Capillary 08/15/2013 117* 70 - 99 mg/dL Final  . Glucose-Capillary 08/15/2013 99  70 - 99 mg/dL Final  . Comment 1 40/98/1191 Documented in Chart   Final  . Comment 2 08/15/2013 Notify RN   Final  . Specimen Description 08/15/2013 WOUND SEROMA   Final  . Special Requests 08/15/2013 RIGHT HIP   Final  . Gram Stain 08/15/2013    Final                   Value:NO WBC SEEN                         NO SQUAMOUS EPITHELIAL CELLS SEEN                         NO  ORGANISMS SEEN                         Performed at [facility]  . Culture 08/15/2013    Final                   Value:RARE PROTEUS MIRABILIS                         Performed at [facility]  . Report Status 08/15/2013 08/18/2013 FINAL   Final  . Organism ID, Bacteria 08/15/2013 PROTEUS MIRABILIS   Final  . Specimen Description 08/15/2013 WOUND SEROMA   Final  . Special Requests 08/15/2013 RIGHT HIP   Final  . Gram Stain 08/15/2013    Final                   Value:NO WBC SEEN                         NO SQUAMOUS EPITHELIAL CELLS SEEN  NO ORGANISMS SEEN                         Performed at [facility]  . Culture 08/15/2013    Final                   Value:NO ANAEROBES ISOLATED; CULTURE IN PROGRESS FOR 5 DAYS                         Performed at [facility]  . Report Status 08/15/2013 PENDING   Incomplete  . Glucose-Capillary 08/15/2013 135* 70 - 99 mg/dL Final  . Glucose-Capillary 08/16/2013 136* 70 - 99 mg/dL Final  . Comment 1 16/09/9603 Notify RN   Final  . Comment 2 08/16/2013 Documented in Chart   Final  . Glucose-Capillary 08/16/2013 149* 70 - 99 mg/dL Final  . Comment 1 54/08/8118 Notify RN   Final  Hospital Outpatient Visit on 08/12/2013  Component Date Value Range Status  . WBC 08/12/2013 11.2* 4.0 - 10.5 K/uL Final  . RBC 08/12/2013 3.91  3.87 - 5.11 MIL/uL Final  . Hemoglobin 08/12/2013 11.0* 12.0 - 15.0 g/dL Final  . HCT 14/78/2956 34.6* 36.0 - 46.0 % Final  . MCV 08/12/2013 88.5  78.0 - 100.0 fL Final  . MCH 08/12/2013 28.1  26.0 - 34.0 pg Final  . MCHC 08/12/2013 31.8  30.0 - 36.0 g/dL Final  . RDW 21/30/8657 14.5  11.5 - 15.5 % Final  . Platelets 08/12/2013 413* 150 - 400 K/uL Final  . Sodium 08/12/2013 138  135 - 145 mEq/L Final  . Potassium 08/12/2013 4.6  3.5 - 5.1 mEq/L Final  . Chloride 08/12/2013 102  96 - 112 mEq/L Final  . CO2 08/12/2013 26  19 - 32 mEq/L Final  . Glucose, Bld 08/12/2013 193* 70 -  99 mg/dL Final  . BUN 84/69/6295 10  6 - 23 mg/dL Final  . Creatinine, Ser 08/12/2013 0.49* 0.50 - 1.10 mg/dL Final  . Calcium 28/41/3244 9.6  8.4 - 10.5 mg/dL Final  . GFR calc non Af Amer 08/12/2013 >90  >90 mL/min Final  . GFR calc Af Amer 08/12/2013 >90  >90 mL/min Final   Comment: (NOTE)                          The eGFR has been calculated using the CKD EPI equation.                          This calculation has not been validated in all clinical situations.                          eGFR's persistently <90 mL/min signify possible Chronic Kidney                          Disease.  Admission on 07/07/2013, Discharged on 07/09/2013  Component Date Value Range Status  . Glucose-Capillary 07/07/2013 100* 70 - 99 mg/dL Final  . Comment 1 12/10/7251 Documented in Chart   Final  . Comment 2 07/07/2013 Notify RN   Final  . WBC 07/08/2013 10.3  4.0 - 10.5 K/uL Final  . RBC 07/08/2013 3.43* 3.87 - 5.11 MIL/uL Final  . Hemoglobin 07/08/2013 10.0* 12.0 - 15.0 g/dL Final  . HCT 66/44/0347 30.3*  36.0 - 46.0 % Final  . MCV 07/08/2013 88.3  78.0 - 100.0 fL Final  . MCH 07/08/2013 29.2  26.0 - 34.0 pg Final  . MCHC 07/08/2013 33.0  30.0 - 36.0 g/dL Final  . RDW 28/41/3244 13.5  11.5 - 15.5 % Final  . Platelets 07/08/2013 234  150 - 400 K/uL Final  . Sodium 07/08/2013 137  135 - 145 mEq/L Final  . Potassium 07/08/2013 3.8  3.5 - 5.1 mEq/L Final  . Chloride 07/08/2013 104  96 - 112 mEq/L Final  . CO2 07/08/2013 25  19 - 32 mEq/L Final  . Glucose, Bld 07/08/2013 170* 70 - 99 mg/dL Final  . BUN 12/10/7251 9  6 - 23 mg/dL Final  . Creatinine, Ser 07/08/2013 0.41* 0.50 - 1.10 mg/dL Final  . Calcium 66/44/0347 8.8  8.4 - 10.5 mg/dL Final  . GFR calc non Af Amer 07/08/2013 >90  >90 mL/min Final  . GFR calc Af Amer 07/08/2013 >90  >90 mL/min Final   Comment:                                 The eGFR has been calculated                          using the CKD EPI equation.                          This  calculation has not been                          validated in all clinical                          situations.                          eGFR's persistently                          <90 mL/min signify                          possible Chronic Kidney Disease.  . Glucose-Capillary 07/07/2013 200* 70 - 99 mg/dL Final  . Glucose-Capillary 07/07/2013 106* 70 - 99 mg/dL Final  . Glucose-Capillary 07/08/2013 134* 70 - 99 mg/dL Final  . Comment 1 42/59/5638 Notify RN   Final  . Comment 2 07/08/2013 Documented in Chart   Final  . Glucose-Capillary 07/08/2013 110* 70 - 99 mg/dL Final  . Comment 1 75/64/3329 Notify RN   Final  . Comment 2 07/08/2013 Documented in Chart   Final  . WBC 07/09/2013 10.0  4.0 - 10.5 K/uL Final  . RBC 07/09/2013 3.21* 3.87 - 5.11 MIL/uL Final  . Hemoglobin 07/09/2013 9.3* 12.0 - 15.0 g/dL Final  . HCT 51/88/4166 28.7* 36.0 - 46.0 % Final  . MCV 07/09/2013 89.4  78.0 - 100.0 fL Final  . MCH 07/09/2013 29.0  26.0 - 34.0 pg Final  . MCHC 07/09/2013 32.4  30.0 - 36.0 g/dL Final  . RDW 06/06/1600 13.8  11.5 - 15.5 % Final  . Platelets 07/09/2013 207  150 - 400 K/uL Final  . Sodium 07/09/2013 137  135 - 145  mEq/L Final  . Potassium 07/09/2013 4.0  3.5 - 5.1 mEq/L Final  . Chloride 07/09/2013 104  96 - 112 mEq/L Final  . CO2 07/09/2013 26  19 - 32 mEq/L Final  . Glucose, Bld 07/09/2013 163* 70 - 99 mg/dL Final  . BUN 16/09/9603 13  6 - 23 mg/dL Final  . Creatinine, Ser 07/09/2013 0.55  0.50 - 1.10 mg/dL Final  . Calcium 54/08/8118 8.5  8.4 - 10.5 mg/dL Final  . GFR calc non Af Amer 07/09/2013 >90  >90 mL/min Final  . GFR calc Af Amer 07/09/2013 >90  >90 mL/min Final   Comment:                                 The eGFR has been calculated                          using the CKD EPI equation.                          This calculation has not been                          validated in all clinical                          situations.                          eGFR's  persistently                          <90 mL/min signify                          possible Chronic Kidney Disease.  . Glucose-Capillary 07/08/2013 180* 70 - 99 mg/dL Final  . Glucose-Capillary 07/08/2013 146* 70 - 99 mg/dL Final  . Glucose-Capillary 07/09/2013 137* 70 - 99 mg/dL Final  . Comment 1 14/78/2956 Notify RN   Final  . Comment 2 07/09/2013 Documented in Chart   Final  . Glucose-Capillary 07/09/2013 146* 70 - 99 mg/dL Final  . Comment 1 21/30/8657 Documented in Chart   Final  . Comment 2 07/09/2013 Notify RN   Final  Hospital Outpatient Visit on 06/30/2013  Component Date Value Range Status  . aPTT 06/30/2013 34  24 - 37 seconds Final  . ABO/RH(D) 06/30/2013 O POS   Final  . Antibody Screen 06/30/2013 NEG   Final  . Sample Expiration 06/30/2013 07/10/2013   Final  . Color, Urine 06/30/2013 YELLOW  YELLOW Final  . APPearance 06/30/2013 CLEAR  CLEAR Final  . Specific Gravity, Urine 06/30/2013 1.012  1.005 - 1.030 Final  . pH 06/30/2013 6.0  5.0 - 8.0 Final  . Glucose, UA 06/30/2013 NEGATIVE  NEGATIVE mg/dL Final  . Hgb urine dipstick 06/30/2013 NEGATIVE  NEGATIVE Final  . Bilirubin Urine 06/30/2013 NEGATIVE  NEGATIVE Final  . Ketones, ur 06/30/2013 NEGATIVE  NEGATIVE mg/dL Final  . Protein, ur 84/69/6295 NEGATIVE  NEGATIVE mg/dL Final  . Urobilinogen, UA 06/30/2013 1.0  0.0 - 1.0 mg/dL Final  . Nitrite 28/41/3244 NEGATIVE  NEGATIVE Final  . Leukocytes, UA 06/30/2013 TRACE* NEGATIVE Final  . MRSA, PCR 06/30/2013 NEGATIVE  NEGATIVE Final  . Staphylococcus aureus 06/30/2013 POSITIVE* NEGATIVE Final   Comment:                                 The Xpert SA Assay (FDA                          approved for NASAL specimens                          in patients over 70 years of age),                          is one component of                          a comprehensive surveillance                          program.  Test performance has                          been validated by  [employer] for patients greater                          than or equal to 55 year old.                          It is not intended                          to diagnose infection nor to                          guide or monitor treatment.  . Squamous Epithelial / LPF 06/30/2013 RARE  RARE Final  . WBC, UA 06/30/2013 0-2  <3 WBC/hpf Final  . ABO/RH(D) 06/30/2013 O POS   Final     X-Rays:No results found.  EKG:No orders found for this or any previous visit.   Hospital Course: AARTI MANKOWSKI is a 67 y.o. who was admitted to [facility]. They were brought to the operating room on 08/15/2013 and underwent Procedure(s): INCISION AND DRAINAGE RIGHT HIP INFECTION (PREVIOUS RIGHT TOTAL HIP REPLACEMENT).  Patient tolerated the procedure well and was later transferred to the recovery room and then to the orthopaedic floor for postoperative care.  They were given PO and IV analgesics for pain control following their surgery.  They were given 24 hours of postoperative antibiotics of  Anti-infectives   Start     Dose/Rate Route Frequency Ordered Stop   08/16/13 0600  ceFAZolin (ANCEF) IVPB 2 g/50 mL premix     2 g 100 mL/hr over 30 Minutes Intravenous On call to O.R. 08/15/13 1359 08/15/13 1652   08/16/13 0000  doxycycline (VIBRAMYCIN) 50 MG capsule     100 mg Oral 2 times daily 08/16/13 0904     08/16/13 0000  cephALEXin (KEFLEX) 250 MG capsule     250 mg Oral 3  times daily 08/16/13 0904     08/15/13 2300  ceFAZolin (ANCEF) IVPB 2 g/50 mL premix     2 g 100 mL/hr over 30 Minutes Intravenous Every 6 hours 08/15/13 1856 08/16/13 1136     and started on DVT prophylaxis in the form of Aspirin.   PT and OT were ordered for total joint protocol.  Discharge planning consulted to help with postop disposition and equipment needs.  Patient had a decnt night on the evening of surgery.  They started to get up OOB with therapy on day one. Hemovac drain was left in place and  sent home with the patient .  Patient was seen in rounds and was ready to go home later that same day.   Discharge Medications: Prior to Admission medications   Medication Sig Start Date End Date Taking? Authorizing Provider  aspirin 81 MG tablet Take 81 mg by mouth daily.   Yes Historical Provider, MD  exenatide (BYETTA) 10 MCG/0.04ML SOPN Inject 10 mcg into the skin daily.   Yes Historical Provider, MD  ramipril (ALTACE) 10 MG capsule Take 20 mg by mouth every evening.   Yes Historical Provider, MD  simvastatin (ZOCOR) 40 MG tablet Take 40 mg by mouth every evening.   Yes Historical Provider, MD  Vitamin D, Ergocalciferol, (DRISDOL) 50000 UNITS CAPS capsule Take 50,000 Units by mouth every 7 (seven) days.   Yes Historical Provider, MD  cephALEXin (KEFLEX) 250 MG capsule Take 1 capsule (250 mg total) by mouth 3 (three) times daily. 08/16/13   Alexzandrew Julien Girt, PA-C  doxycycline (VIBRAMYCIN) 50 MG capsule Take 2 capsules (100 mg total) by mouth 2 (two) times daily. 08/16/13   Alexzandrew Perkins, PA-C  oxyCODONE (OXY IR/ROXICODONE) 5 MG immediate release tablet Take 1-3 tablets (5-15 mg total) by mouth every 3 (three) hours as needed. 08/16/13   Alexzandrew Julien Girt, PA-C  pioglitazone-metformin (ACTOPLUS MET) 15-850 MG per tablet Take 1 tablet by mouth every evening.    Historical Provider, MD    Diet: Cardiac diet and Diabetic diet Activity:WBAT Follow-up:in 2 days on Thursday Disposition - Home Discharged Condition: stable       Discharge Orders   Future Orders Complete By Expires   Call MD / Call 911  As directed    Comments:     If you experience chest pain or shortness of breath, CALL 911 and be transported to the hospital emergency room.  If you develope a fever above 101 F, pus (white drainage) or increased drainage or redness at the wound, or calf pain, call your surgeon's office.   Constipation Prevention  As directed    Comments:     Drink plenty of fluids.  Prune juice may be  helpful.  You may use a stool softener, such as Colace (over the counter) 100 mg twice a day.  Use MiraLax (over the counter) for constipation as needed.   Diet - low sodium heart healthy  As directed    Diet Carb Modified  As directed    Discharge instructions  As directed    Comments:     Pick up stool softner and laxative for home. Do not submerge incision under water. Sponge baths until follow up Continue to use ice for pain and swelling from surgery.  Resume home aspirin.  Measure and record drain output.  Bring the written totals to the office on Thrusday. Call office for appointment time.   Do not sit on low chairs, stoools or toilet seats, as it  may be difficult to get up from low surfaces  As directed    Driving restrictions  As directed    Comments:     No driving until released by the physician.   Increase activity slowly as tolerated  As directed    Lifting restrictions  As directed    Comments:     No lifting until released by the physician.   TED hose  As directed    Comments:     Use stockings (TED hose) for 3 weeks on both leg(s).  You may remove them at night for sleeping.   Weight bearing as tolerated  As directed        Medication List         aspirin 81 MG tablet  Take 81 mg by mouth daily.     cephALEXin 250 MG capsule  Commonly known as:  KEFLEX  Take 1 capsule (250 mg total) by mouth 3 (three) times daily.     doxycycline 50 MG capsule  Commonly known as:  VIBRAMYCIN  Take 2 capsules (100 mg total) by mouth 2 (two) times daily.     exenatide 10 MCG/0.04ML Sopn injection  Commonly known as:  BYETTA  Inject 10 mcg into the skin daily.     oxyCODONE 5 MG immediate release tablet  Commonly known as:  Oxy IR/ROXICODONE  Take 1-3 tablets (5-15 mg total) by mouth every 3 (three) hours as needed.     pioglitazone-metformin 15-850 MG per tablet  Commonly known as:  ACTOPLUS MET  Take 1 tablet by mouth every evening.     ramipril 10 MG capsule    Commonly known as:  ALTACE  Take 20 mg by mouth every evening.     simvastatin 40 MG tablet  Commonly known as:  ZOCOR  Take 40 mg by mouth every evening.     Vitamin D (Ergocalciferol) 50000 UNITS Caps capsule  Commonly known as:  DRISDOL  Take 50,000 Units by mouth every 7 (seven) days.       Follow-up Information   Follow up with Loanne Drilling, MD. Schedule an appointment as soon as possible for a visit on 08/18/2013. (Call office for appointment time.)    Specialty:  Orthopedic Surgery   Contact information:   [address] [place] [address] [phone]       Signed: Patrica Duel 08/18/2013, 10:52 AM

## 2013-08-16 NOTE — Progress Notes (Signed)
   Subjective: 1 Day Post-Op Procedure(s) (LRB): INCISION AND DRAINAGE RIGHT HIP INFECTION (PREVIOUS RIGHT TOTAL HIP REPLACEMENT) (Right) Patient reports pain as mild.   Patient seen in rounds with Dr. Lequita Halt. Patient is well, but has had some minor complaints of pain in the hip area, requiring pain medications Patient is ready to go home later today if does well and up walking well. Will send home on oral antibiotics.  Objective: Vital signs in last 24 hours: Temp:  [97.5 F (36.4 C)-98.5 F (36.9 C)] 97.9 F (36.6 C) (09/09 0640) Pulse Rate:  [63-88] 68 (09/09 0640) Resp:  [13-25] 16 (09/09 0640) BP: (95-191)/(53-91) 133/71 mmHg (09/09 0640) SpO2:  [95 %-100 %] 95 % (09/09 0640) Weight:  [114.76 kg (253 lb)] 114.76 kg (253 lb) (09/08 1845)  Intake/Output from previous day:  Intake/Output Summary (Last 24 hours) at 08/16/13 0856 Last data filed at 08/16/13 0643  Gross per 24 hour  Intake 3012.5 ml  Output   1425 ml  Net 1587.5 ml    Intake/Output this shift:    Labs: No results found for this basename: HGB,  in the last 72 hours No results found for this basename: WBC, RBC, HCT, PLT,  in the last 72 hours No results found for this basename: NA, K, CL, CO2, BUN, CREATININE, GLUCOSE, CALCIUM,  in the last 72 hours No results found for this basename: LABPT, INR,  in the last 72 hours  EXAM: General - Patient is Alert, Appropriate and Oriented Extremity - Neurovascular intact Sensation intact distally Dorsiflexion/Plantar flexion intact Dressing intact Motor Function - intact, moving foot and toes well on exam.   Assessment/Plan: 1 Day Post-Op Procedure(s) (LRB): INCISION AND DRAINAGE RIGHT HIP INFECTION (PREVIOUS RIGHT TOTAL HIP REPLACEMENT) (Right) Procedure(s) (LRB): INCISION AND DRAINAGE RIGHT HIP INFECTION (PREVIOUS RIGHT TOTAL HIP REPLACEMENT) (Right) Past Medical History  Diagnosis Date  . Diabetes mellitus without complication   . Arthritis   .  Hypertension   . Heart murmur    Principal Problem:   Local infection of skin and subcutaneous tissue  Estimated body mass index is 42.1 kg/(m^2) as calculated from the following:   Height as of this encounter: 5\' 5"  (1.651 m).   Weight as of this encounter: 114.76 kg (253 lb).  Diet - Cardiac diet and Diabetic diet Follow up - in 2 days on Thursday.  Call for appointment time Activity - WBAT Disposition - Home Condition Upon Discharge - Stable D/C Meds - See DC Summary DVT Prophylaxis - Aspirin  Kelly [name] 08/16/2013, 8:56 AM

## 2013-08-18 LAB — WOUND CULTURE

## 2013-08-20 LAB — ANAEROBIC CULTURE: Gram Stain: NONE SEEN

## 2013-09-09 ENCOUNTER — Encounter (HOSPITAL_COMMUNITY)
Admission: AD | Disposition: A | Discharge status: Home / Self Care | Payer: Self-pay | Source: Ambulatory Visit | Attending: Orthopedic Surgery

## 2013-09-09 ENCOUNTER — Other Ambulatory Visit: Payer: Self-pay | Admitting: Surgical

## 2013-09-09 ENCOUNTER — Inpatient Hospital Stay (HOSPITAL_COMMUNITY)
Admission: AD | Admit: 2013-09-09 | Discharge: 2013-09-13 | DRG: 415 | Disposition: A | Discharge status: Home / Self Care | Payer: BC Managed Care – PPO | Source: Ambulatory Visit | Attending: Orthopedic Surgery | Admitting: Orthopedic Surgery

## 2013-09-09 ENCOUNTER — Encounter (HOSPITAL_COMMUNITY): Payer: Self-pay | Admitting: *Deleted

## 2013-09-09 ENCOUNTER — Encounter (HOSPITAL_COMMUNITY): Payer: Self-pay | Admitting: Registered Nurse

## 2013-09-09 ENCOUNTER — Inpatient Hospital Stay (HOSPITAL_COMMUNITY): Payer: BC Managed Care – PPO | Admitting: Registered Nurse

## 2013-09-09 DIAGNOSIS — Y831 Surgical operation with implant of artificial internal device as the cause of abnormal reaction of the patient, or of later complication, without mention of misadventure at the time of the procedure: Secondary | ICD-10-CM | POA: Diagnosis present

## 2013-09-09 DIAGNOSIS — I10 Essential (primary) hypertension: Secondary | ICD-10-CM | POA: Diagnosis present

## 2013-09-09 DIAGNOSIS — E119 Type 2 diabetes mellitus without complications: Secondary | ICD-10-CM | POA: Diagnosis present

## 2013-09-09 DIAGNOSIS — Z6841 Body Mass Index (BMI) 40.0 and over, adult: Secondary | ICD-10-CM

## 2013-09-09 DIAGNOSIS — M169 Osteoarthritis of hip, unspecified: Secondary | ICD-10-CM

## 2013-09-09 DIAGNOSIS — L089 Local infection of the skin and subcutaneous tissue, unspecified: Secondary | ICD-10-CM

## 2013-09-09 DIAGNOSIS — T8140XA Infection following a procedure, unspecified, initial encounter: Principal | ICD-10-CM | POA: Diagnosis present

## 2013-09-09 DIAGNOSIS — Z96649 Presence of unspecified artificial hip joint: Secondary | ICD-10-CM

## 2013-09-09 HISTORY — PX: INCISION AND DRAINAGE HIP: SHX1801

## 2013-09-09 LAB — COMPREHENSIVE METABOLIC PANEL
ALT: 11 U/L (ref 0–35)
AST: 17 U/L (ref 0–37)
Albumin: 3.5 g/dL (ref 3.5–5.2)
Alkaline Phosphatase: 161 U/L — ABNORMAL HIGH (ref 39–117)
BUN: 12 mg/dL (ref 6–23)
CO2: 26 mEq/L (ref 19–32)
Calcium: 9.7 mg/dL (ref 8.4–10.5)
Chloride: 104 mEq/L (ref 96–112)
Creatinine, Ser: 0.51 mg/dL (ref 0.50–1.10)
GFR calc Af Amer: >90 mL/min (ref >90)
GFR calc non Af Amer: >90 mL/min (ref >90)
Glucose, Bld: 151 mg/dL — ABNORMAL HIGH (ref 70–99)
Potassium: 4.3 mEq/L (ref 3.5–5.1)
Sodium: 141 mEq/L (ref 135–145)
Total Bilirubin: 0.5 mg/dL (ref 0.3–1.2)
Total Protein: 7.4 g/dL (ref 6.0–8.3)

## 2013-09-09 LAB — CBC WITH DIFFERENTIAL/PLATELET
Basophils Absolute: 0 10*3/uL (ref 0.0–0.1)
Basophils Relative: 0 % (ref 0–1)
Eosinophils Absolute: 0.3 10*3/uL (ref 0.0–0.7)
Eosinophils Relative: 3 % (ref 0–5)
HCT: 34.1 % — ABNORMAL LOW (ref 36.0–46.0)
Hemoglobin: 11 g/dL — ABNORMAL LOW (ref 12.0–15.0)
Lymphocytes Relative: 21 % (ref 12–46)
Lymphs Abs: 1.8 10*3/uL (ref 0.7–4.0)
MCH: 27.4 pg (ref 26.0–34.0)
MCHC: 32.3 g/dL (ref 30.0–36.0)
MCV: 85 fL (ref 78.0–100.0)
Monocytes Absolute: 0.6 10*3/uL (ref 0.1–1.0)
Monocytes Relative: 7 % (ref 3–12)
Neutro Abs: 5.8 10*3/uL (ref 1.7–7.7)
Neutrophils Relative %: 68 % (ref 43–77)
Platelets: 384 10*3/uL (ref 150–400)
RBC: 4.01 MIL/uL (ref 3.87–5.11)
RDW: 15.2 % (ref 11.5–15.5)
WBC: 8.5 10*3/uL (ref 4.0–10.5)

## 2013-09-09 LAB — PROTIME-INR
INR: 1.04 (ref 0.00–1.49)
Prothrombin Time: 13.4 seconds (ref 11.6–15.2)

## 2013-09-09 LAB — APTT: aPTT: 35 seconds (ref 24–37)

## 2013-09-09 LAB — SURGICAL PCR SCREEN
MRSA, PCR: NEGATIVE
Staphylococcus aureus: NEGATIVE

## 2013-09-09 SURGERY — IRRIGATION AND DEBRIDEMENT HIP
Anesthesia: General | Site: Hip | Laterality: Right | Wound class: Dirty or Infected

## 2013-09-09 MED ORDER — SODIUM CHLORIDE 0.9 % IJ SOLN: 3.0000 mL | Freq: Two times a day (BID) | INTRAMUSCULAR | Status: DC

## 2013-09-09 MED ORDER — FLEET ENEMA 7-19 GM/118ML RE ENEM: 1.0000 | ENEMA | Freq: Once | RECTAL | Status: AC | PRN

## 2013-09-09 MED ORDER — FENTANYL CITRATE 0.05 MG/ML IJ SOLN
INTRAMUSCULAR | Status: DC | PRN
  Administered 2013-09-09 (×2): 100 ug via INTRAVENOUS
  Administered 2013-09-09: 50 ug via INTRAVENOUS

## 2013-09-09 MED ORDER — VANCOMYCIN HCL 10 G IV SOLR
2000.0000 mg | Freq: Once | INTRAVENOUS | Status: AC
  Administered 2013-09-09: 2000 mg via INTRAVENOUS
  Filled 2013-09-09: qty 2000

## 2013-09-09 MED ORDER — ACETAMINOPHEN 650 MG RE SUPP: 650.0000 mg | Freq: Four times a day (QID) | RECTAL | Status: DC | PRN

## 2013-09-09 MED ORDER — LACTATED RINGERS IV SOLN
INTRAVENOUS | Status: DC | PRN
  Administered 2013-09-09 – 2013-09-10 (×2): via INTRAVENOUS

## 2013-09-09 MED ORDER — SODIUM CHLORIDE 0.9 % IV SOLN: 250.0000 mL | INTRAVENOUS | Status: DC | PRN

## 2013-09-09 MED ORDER — ACETAMINOPHEN 325 MG PO TABS: 650.0000 mg | ORAL_TABLET | Freq: Four times a day (QID) | ORAL | Status: DC | PRN

## 2013-09-09 MED ORDER — BISACODYL 10 MG RE SUPP: 10.0000 mg | Freq: Every day | RECTAL | Status: DC | PRN

## 2013-09-09 MED ORDER — ONDANSETRON HCL 4 MG/2ML IJ SOLN: 4.0000 mg | Freq: Four times a day (QID) | INTRAMUSCULAR | Status: DC | PRN

## 2013-09-09 MED ORDER — SODIUM CHLORIDE 0.9 % IJ SOLN: 3.0000 mL | INTRAMUSCULAR | Status: DC | PRN

## 2013-09-09 MED ORDER — CEFAZOLIN SODIUM-DEXTROSE 2-3 GM-% IV SOLR
2.0000 g | Freq: Three times a day (TID) | INTRAVENOUS | Status: DC
  Administered 2013-09-09 – 2013-09-10 (×3): 2 g via INTRAVENOUS
  Filled 2013-09-09 (×5): qty 50

## 2013-09-09 MED ORDER — HYDROCODONE-ACETAMINOPHEN 5-325 MG PO TABS
1.0000 | ORAL_TABLET | ORAL | Status: DC | PRN
  Administered 2013-09-12 – 2013-09-13 (×3): 1 via ORAL
  Filled 2013-09-09 (×3): qty 1

## 2013-09-09 MED ORDER — VANCOMYCIN HCL IN DEXTROSE 1-5 GM/200ML-% IV SOLN
1000.0000 mg | Freq: Two times a day (BID) | INTRAVENOUS | Status: DC
  Administered 2013-09-09 – 2013-09-12 (×6): 1000 mg via INTRAVENOUS
  Filled 2013-09-09 (×7): qty 200

## 2013-09-09 MED ORDER — POLYETHYLENE GLYCOL 3350 17 G PO PACK: 17.0000 g | PACK | Freq: Every day | ORAL | Status: DC | PRN

## 2013-09-09 MED ORDER — OXYCODONE HCL 5 MG PO TABS
5.0000 mg | ORAL_TABLET | ORAL | Status: DC | PRN
  Filled 2013-09-09: qty 1

## 2013-09-09 MED ORDER — ONDANSETRON HCL 4 MG PO TABS: 4.0000 mg | ORAL_TABLET | Freq: Four times a day (QID) | ORAL | Status: DC | PRN

## 2013-09-09 MED ORDER — HYDROMORPHONE HCL PF 1 MG/ML IJ SOLN: 0.5000 mg | INTRAMUSCULAR | Status: DC | PRN

## 2013-09-09 MED ORDER — TRAMADOL HCL 50 MG PO TABS
50.0000 mg | ORAL_TABLET | Freq: Four times a day (QID) | ORAL | Status: DC
  Administered 2013-09-10 – 2013-09-13 (×11): 50 mg via ORAL
  Filled 2013-09-09 (×11): qty 1

## 2013-09-09 MED ORDER — SODIUM CHLORIDE 0.9 % IV SOLN
INTRAVENOUS | Status: DC
  Administered 2013-09-09: 14:00:00 via INTRAVENOUS

## 2013-09-09 MED ORDER — MIDAZOLAM HCL 5 MG/5ML IJ SOLN
INTRAMUSCULAR | Status: DC | PRN
  Administered 2013-09-09: 2 mg via INTRAVENOUS

## 2013-09-09 SURGICAL SUPPLY — 34 items
BAG SPEC THK2 15X12 ZIP CLS (MISCELLANEOUS) ×1
BAG ZIPLOCK 12X15 (MISCELLANEOUS) ×2 IMPLANT
BANDAGE ESMARK 6X9 LF (GAUZE/BANDAGES/DRESSINGS) IMPLANT
BANDAGE GAUZE ELAST BULKY 4 IN (GAUZE/BANDAGES/DRESSINGS) ×4 IMPLANT
BNDG CMPR 9X6 STRL LF SNTH (GAUZE/BANDAGES/DRESSINGS)
BNDG ESMARK 6X9 LF (GAUZE/BANDAGES/DRESSINGS)
CLOTH BEACON ORANGE TIMEOUT ST (SAFETY) ×2 IMPLANT
CUFF TOURN SGL QUICK 18 (TOURNIQUET CUFF) IMPLANT
CUFF TOURN SGL QUICK 34 (TOURNIQUET CUFF)
CUFF TRNQT CYL 34X4X40X1 (TOURNIQUET CUFF) IMPLANT
DRAIN PENROSE 18X1/2 LTX STRL (DRAIN) IMPLANT
DRSG EMULSION OIL 3X3 NADH (GAUZE/BANDAGES/DRESSINGS) ×2 IMPLANT
DRSG PAD ABDOMINAL 8X10 ST (GAUZE/BANDAGES/DRESSINGS) ×2 IMPLANT
ELECT REM PT RETURN 9FT ADLT (ELECTROSURGICAL) ×2
ELECTRODE REM PT RTRN 9FT ADLT (ELECTROSURGICAL) ×1 IMPLANT
GLOVE ORTHO TXT STRL SZ7.5 (GLOVE) ×2 IMPLANT
GLOVE SURG ORTHO 8.5 STRL (GLOVE) ×2 IMPLANT
GOWN PREVENTION PLUS LG XLONG (DISPOSABLE) ×4 IMPLANT
HANDPIECE INTERPULSE COAX TIP (DISPOSABLE) ×2
KIT BASIN OR (CUSTOM PROCEDURE TRAY) ×2 IMPLANT
MANIFOLD NEPTUNE II (INSTRUMENTS) ×2 IMPLANT
PACK LOWER EXTREMITY WL (CUSTOM PROCEDURE TRAY) ×2 IMPLANT
PAD CAST 4YDX4 CTTN HI CHSV (CAST SUPPLIES) IMPLANT
PADDING CAST COTTON 4X4 STRL (CAST SUPPLIES)
POSITIONER SURGICAL ARM (MISCELLANEOUS) ×2 IMPLANT
SET HNDPC FAN SPRY TIP SCT (DISPOSABLE) ×1 IMPLANT
SPONGE GAUZE 4X4 12PLY (GAUZE/BANDAGES/DRESSINGS) ×2 IMPLANT
SUT ETHILON 2 0 PSLX (SUTURE) ×4 IMPLANT
SWAB COLLECTION DEVICE MRSA (MISCELLANEOUS) ×4 IMPLANT
SYR CONTROL 10ML LL (SYRINGE) IMPLANT
TAPE CLOTH SURG 6X10 WHT LF (GAUZE/BANDAGES/DRESSINGS) ×2 IMPLANT
TOWEL OR 17X26 10 PK STRL BLUE (TOWEL DISPOSABLE) ×4 IMPLANT
TRAY FOLEY CATH 14FRSI W/METER (CATHETERS) ×2 IMPLANT
TUBE ANAEROBIC SPECIMEN COL (MISCELLANEOUS) ×4 IMPLANT

## 2013-09-09 NOTE — H&P (View-Only) (Signed)
Kelly [name] is an 67 y.o. female.   Chief Complaint: Draining right hip wound HPI: Kelly [name] is a 66 yo female who underwent right THA on 07/07/13 without complications. She had a small amount of serous drainage from her mid-incision upon her post-op visit which was 2 weeks after surgery. She had been on oral antibiotics for preventive measures but had not developed any fever, chills or systemic symptoms. She presented to the office 3 days ago with an area approximately 6 inches long with necrotic wound edges and serous drainage. There was no erythema or overt infection.She has a significant amount of subcutaneous fat and it was felt that this represented fat necrosis. She presents today for formal irrigation and debridement. She has not had fever, chills or systemic signs of infection.  Past Medical History  Diagnosis Date  . Diabetes mellitus without complication   . Arthritis   . Hypertension   . Heart murmur     Past Surgical History  Procedure Laterality Date  . Total hip arthroplasty Right 07/07/2013    Procedure: RIGHT TOTAL HIP ARTHROPLASTY;  Surgeon: Loanne Drilling, MD;  Location: [facility];  Service: Orthopedics;  Laterality: Right;    History reviewed. No pertinent family history. Social History:  reports that she has never smoked. She has never used smokeless tobacco. She reports that she does not drink alcohol or use illicit drugs.  Allergies:  Allergies  Allergen Reactions  . Robaxin [Methocarbamol] Nausea And Vomiting    Medications Prior to Admission  Medication Sig Dispense Refill  . aspirin 81 MG tablet Take 81 mg by mouth daily.      Marland Kitchen exenatide (BYETTA) 10 MCG/0.04ML SOPN Inject 10 mcg into the skin daily.      . ramipril (ALTACE) 10 MG capsule Take 20 mg by mouth every evening.      . simvastatin (ZOCOR) 40 MG tablet Take 40 mg by mouth every evening.      . Vitamin D, Ergocalciferol, (DRISDOL) 50000 UNITS CAPS capsule Take 50,000 Units by mouth every 7  (seven) days.      . pioglitazone-metformin (ACTOPLUS MET) 15-850 MG per tablet Take 1 tablet by mouth every evening.        Results for orders placed during the hospital encounter of 08/15/13 (from the past 48 hour(s))  GLUCOSE, CAPILLARY     Status: Abnormal   Collection Time    08/15/13  2:15 PM      Result Value Range   Glucose-Capillary 117 (*) 70 - 99 mg/dL   No results found.  ROS  Blood pressure 191/60, pulse 88, temperature 98.2 F (36.8 C), temperature source Oral, resp. rate 20, SpO2 95.00%. Physical Exam  Physical Examination: General appearance - alert, well appearing, and in no distress Mental status - alert, oriented to person, place, and time Neck - supple, no significant adenopathy Chest - clear to auscultation, no wheezes, rales or rhonchi, symmetric air entry Heart - normal rate, regular rhythm, normal S1, S2, no murmurs, rubs, clicks or gallops Abdomen - soft, nontender, nondistended, no masses or organomegaly Approximately 6 inch area mid incision that is open to subQ tissues.No erythema or purulence   Assessment/Plan Right hip Subcutaneous soft tissue infection- Plan irrigation and deridement. Discussed in detail with patient who elects to proceed.  Loanne Drilling 08/15/2013, 3:27 PM

## 2013-09-09 NOTE — Anesthesia Preprocedure Evaluation (Addendum)
Anesthesia Evaluation  Patient identified by MRN, date of birth, ID band Patient awake    Reviewed: Allergy & Precautions, H&P , NPO status , Patient's Chart, lab work & pertinent test results, reviewed documented beta blocker date and time   History of Anesthesia Complications Negative for: history of anesthetic complications  Airway Mallampati: III TM Distance: >3 FB Neck ROM: full    Dental  (+) Teeth Intact   Pulmonary neg pulmonary ROS,  breath sounds clear to auscultation  Pulmonary exam normal       Cardiovascular hypertension, Rhythm:regular Rate:Normal     Neuro/Psych negative neurological ROS  negative psych ROS   GI/Hepatic negative GI ROS, Neg liver ROS,   Endo/Other  diabetes, Type 2, Oral Hypoglycemic AgentsMorbid obesity  Renal/GU negative Renal ROS  negative genitourinary   Musculoskeletal  (+) Arthritis - (wound infection right hip (s/p THA 07/07/2013)),   Abdominal   Peds  Hematology  (+) anemia ,   Anesthesia Other Findings Last ate at 9 am  Reproductive/Obstetrics negative OB ROS                           Anesthesia Physical Anesthesia Plan  ASA: III  Anesthesia Plan: General ETT   Post-op Pain Management:    Induction:   Airway Management Planned:   Additional Equipment:   Intra-op Plan:   Post-operative Plan:   Informed Consent: I have reviewed the patients History and Physical, chart, labs and discussed the procedure including the risks, benefits and alternatives for the proposed anesthesia with the patient or authorized representative who has indicated his/her understanding and acceptance.   Dental Advisory Given  Plan Discussed with: CRNA and Surgeon  Anesthesia Plan Comments: (Surgeon requests GA)      Anesthesia Quick Evaluation

## 2013-09-09 NOTE — Progress Notes (Signed)
Utilization review completed.  

## 2013-09-09 NOTE — Plan of Care (Signed)
Problem: Diagnosis - Type of Surgery Goal: General Surgical Patient Education (See Patient Education module for education specifics) Post op infection possible I&D

## 2013-09-09 NOTE — Interval H&P Note (Signed)
History and Physical Interval Note:  09/09/2013 11:13 PM  Kelly [name]  has presented today for surgery, with the diagnosis of RIGHT HIP INFECTION  The various methods of treatment have been discussed with the patient and family. After consideration of risks, benefits and other options for treatment, the patient has consented to  Procedure(s): IRRIGATION AND DEBRIDEMENT HIP (Right) as a surgical intervention .  The patient's history has been reviewed, patient examined, no change in status, stable for surgery.  I have reviewed the patient's chart and labs.  Questions were answered to the patient's satisfaction.     Zsazsa Bahena,STEVEN R

## 2013-09-09 NOTE — Progress Notes (Signed)
ANTIBIOTIC CONSULT NOTE - INITIAL  Pharmacy Consult for vancomycin Indication: incisional infection  Allergies  Allergen Reactions  . Robaxin [Methocarbamol] Nausea And Vomiting    Patient Measurements:    Vital Signs:   Intake/Output from previous day:   Intake/Output from this shift:    Labs: No results found for this basename: WBC, HGB, PLT, LABCREA, CREATININE,  in the last 72 hours The CrCl is unknown because both a height and weight (above a minimum accepted value) are required for this calculation. No results found for this basename: VANCOTROUGH, VANCOPEAK, VANCORANDOM, GENTTROUGH, GENTPEAK, GENTRANDOM, TOBRATROUGH, TOBRAPEAK, TOBRARND, AMIKACINPEAK, AMIKACINTROU, AMIKACIN,  in the last 72 hours   Microbiology: Recent Results (from the past 720 hour(s))  WOUND CULTURE     Status: None   Collection Time    08/15/13  5:13 PM      Result Value Range Status   Specimen Description WOUND SEROMA   Final   Special Requests RIGHT HIP   Final   Gram Stain     Final   Value: NO WBC SEEN     NO SQUAMOUS EPITHELIAL CELLS SEEN     NO ORGANISMS SEEN     Performed at [facility]   Culture     Final   Value: RARE PROTEUS MIRABILIS     Performed at [facility]   Report Status 08/18/2013 FINAL   Final   Organism ID, Bacteria PROTEUS MIRABILIS   Final  ANAEROBIC CULTURE     Status: None   Collection Time    08/15/13  5:13 PM      Result Value Range Status   Specimen Description WOUND SEROMA   Final   Special Requests RIGHT HIP   Final   Gram Stain     Final   Value: NO WBC SEEN     NO SQUAMOUS EPITHELIAL CELLS SEEN     NO ORGANISMS SEEN     Performed at [facility]   Culture     Final   Value: NO ANAEROBES ISOLATED     Performed at [facility]   Report Status 08/20/2013 FINAL   Final    Medical History: Past Medical History  Diagnosis Date  . Diabetes mellitus without complication   . Arthritis   . Hypertension   . Heart murmur     Assessment: 2 YOF admitted with orhto office with drainage from R hip.  She is s/p R THA in 06/2013.  Appears drainage started several weeks after surgery. Per notes, does not appear infection has involved the joint at this point.  She is s/p I&D 08/15/13.  Per notes, cultures taken in office 10/3.   10/3 >>vancomycin  >> 10/3 >> cefazolin  >>    Tmax: 98.4 WBCs: pending Renal: pending, in sept SCr = 0.5  10/3 urine:  10/3 wound (done in office):   Dose changes/drug level info:   Goal of Therapy:  Vancomycin trough level 15-20 mcg/ml  Plan:   Vancomycin 2gm IV x 1 then 1gm IV q12h  Cefazolin 2gm IV q8h  Follow-up with admitting labs/weight and make adjustments PRN  Juliette Alcide, PharmD, BCPS.   Pager: [phone]  09/09/2013,12:30 PM

## 2013-09-09 NOTE — H&P (Signed)
Kelly [name] is an 67 y.o. female.   Chief Complaint: drainage from right hip HPI: Kelly [name] is a 67 year old female who presented to the office with the chief complaint of increased drainage from her incision of her right total hip and subsequent I&D. She had a right total hip arthroplasty on July 07, 2013. She did well initially but developed some serous drainage from her incision after a couple weeks. She was treated with oral antibiotics, but at her 5 week check-up she had some wound necrosis and skin breakdown. She was taken to the OR where Dr. Lequita Halt performed an I&D on 08/15/2013. She has been following Dr. Lequita Halt in the office at that time and has been on Augmentin. She has had some pinpoint drainage from the wound, but noticed some increased drainage and opening of the wound yesterday. She also noticed some pus around the wound. She denies fevers and chills. She presented to our office for evaluation. Due to her history the current appearance of the wound, we elected to admit her to possible repeat I&D as well as IV antibiotic coverage. Cultures were taken in the office.   Past Medical History  Diagnosis Date  . Diabetes mellitus without complication   . Arthritis   . Hypertension   . Heart murmur     Past Surgical History  Procedure Laterality Date  . Total hip arthroplasty Right 07/07/2013    Procedure: RIGHT TOTAL HIP ARTHROPLASTY;  Surgeon: Loanne Drilling, MD;  Location: [facility];  Service: Orthopedics;  Laterality: Right;  . I&d extremity Right 08/15/2013    Procedure: INCISION AND DRAINAGE RIGHT HIP INFECTION (PREVIOUS RIGHT TOTAL HIP REPLACEMENT);  Surgeon: Loanne Drilling, MD;  Location: [facility];  Service: Orthopedics;  Laterality: Right;    Social History:  reports that she has never smoked. She has never used smokeless tobacco. She reports that she does not drink alcohol or use illicit drugs.  Allergies:  Allergies  Allergen Reactions  . Robaxin [Methocarbamol] Nausea And  Vomiting    Review of Systems  Constitutional: Negative.   HENT: Negative.  Negative for neck pain.   Eyes: Negative.   Respiratory: Negative.   Cardiovascular: Negative.   Gastrointestinal: Negative.   Genitourinary: Negative.   Musculoskeletal: Positive for joint pain. Negative for myalgias, back pain and falls.       Right hip pain  Skin: Negative.   Neurological: Negative.   Endo/Heme/Allergies: Negative.   Psychiatric/Behavioral: Negative.    Physical Exam  Constitutional: She is oriented to person, place, and time. She appears well-developed and well-nourished. No distress.  HENT:  Head: Normocephalic and atraumatic.  Right Ear: External ear normal.  Left Ear: External ear normal.  Nose: Nose normal.  Mouth/Throat: Oropharynx is clear and moist.  Eyes: EOM are normal.  Neck: Normal range of motion. Neck supple.  Cardiovascular: Normal rate, regular rhythm, normal heart sounds and intact distal pulses.   Respiratory: Effort normal and breath sounds normal. No respiratory distress.  GI: Soft. Bowel sounds are normal. She exhibits no distension. There is no tenderness.  Musculoskeletal:       Right hip: She exhibits decreased range of motion, decreased strength and swelling.       Left hip: Normal.       Right knee: Normal.       Left knee: Normal.       Right lower leg: She exhibits no tenderness and no swelling.       Left lower leg:  She exhibits no tenderness and no swelling.  Neurological: She is alert and oriented to person, place, and time. She has normal strength and normal reflexes. No sensory deficit.  Skin: Skin is warm. No rash noted. She is not diaphoretic. There is erythema.     Psychiatric: She has a normal mood and affect. Her behavior is normal.     Assessment/Plan Justyce has a history of infection surrounding her right total hip. At the present time, it appears to be superficial to the joint. However, we have to be cautious with this. We are  admitting her for IV antibiotics and possible repeat I&D. Dr. Ranell Patrick is on call today. We will have him evaluate her in the hospital to see if he feels that she needs an I&D of the right hip. We will make her NPO until a decision is made about surgery. Will continue to follow during hospital admission.   Leontina Skidmore LAUREN 09/09/2013, 11:40 AM

## 2013-09-09 NOTE — Progress Notes (Signed)
   Subjective:     Right hip infection/wound complication Pt had right total hip arthroplasty by Dr. Lequita Halt 07/07/13 with recent I&D 5 weeks s/p surgery Pt presented to the clinic today with open wound with mild purulence to the right hip wound Pt denies fevers chills or systemic issues  Patient reports pain as mild.  Objective:   VITALS:   Filed Vitals:   09/09/13 1242  BP: 174/81  Pulse: 84  Temp: 98.4 F (36.9 C)  Resp: 18    Right hip: mild erythema and mild purulence to right hip incision nv intact distally No rashes distally No pain with ER or IR of right hip  LABS  Recent Labs  09/09/13 1250  HGB 11.0*  HCT 34.1*  WBC 8.5  PLT 384     Recent Labs  09/09/13 1250  NA 141  K 4.3  BUN 12  CREATININE 0.51  GLUCOSE 151*     Assessment/Plan:    Right hip wound infection  Keep NPO Plan for surgery later today Discussed surgical risks and benefits and need for surgery today Patient in agreement Continue IV antibiotics       Alphonsa Overall, MPAS, PA-C  09/09/2013, 4:25 PM

## 2013-09-10 ENCOUNTER — Inpatient Hospital Stay (HOSPITAL_COMMUNITY): Payer: BC Managed Care – PPO

## 2013-09-10 LAB — BASIC METABOLIC PANEL
BUN: 10 mg/dL (ref 6–23)
CO2: 26 mEq/L (ref 19–32)
Calcium: 8.6 mg/dL (ref 8.4–10.5)
Creatinine, Ser: 0.42 mg/dL — ABNORMAL LOW (ref 0.50–1.10)
GFR calc non Af Amer: >90 mL/min (ref >90)
Glucose, Bld: 161 mg/dL — ABNORMAL HIGH (ref 70–99)
Sodium: 139 mEq/L (ref 135–145)

## 2013-09-10 LAB — GLUCOSE, CAPILLARY
Glucose-Capillary: 144 mg/dL — ABNORMAL HIGH (ref 70–99)
Glucose-Capillary: 146 mg/dL — ABNORMAL HIGH (ref 70–99)
Glucose-Capillary: 158 mg/dL — ABNORMAL HIGH (ref 70–99)

## 2013-09-10 LAB — URINE CULTURE
Colony Count: NO GROWTH
Culture: NO GROWTH

## 2013-09-10 LAB — HEMOGLOBIN AND HEMATOCRIT, BLOOD: HCT: 30.5 % — ABNORMAL LOW (ref 36.0–46.0)

## 2013-09-10 MED ORDER — METOCLOPRAMIDE HCL 5 MG/ML IJ SOLN
INTRAMUSCULAR | Status: DC | PRN
  Administered 2013-09-10: 10 mg via INTRAVENOUS

## 2013-09-10 MED ORDER — METFORMIN HCL 850 MG PO TABS
850.0000 mg | ORAL_TABLET | Freq: Every day | ORAL | Status: DC
  Administered 2013-09-10 – 2013-09-11 (×2): 850 mg via ORAL
  Filled 2013-09-10 (×4): qty 1

## 2013-09-10 MED ORDER — PROPOFOL 10 MG/ML IV BOLUS
INTRAVENOUS | Status: DC | PRN
  Administered 2013-09-09: 150 mg via INTRAVENOUS
  Administered 2013-09-10: 50 mg via INTRAVENOUS

## 2013-09-10 MED ORDER — ONDANSETRON HCL 4 MG PO TABS
4.0000 mg | ORAL_TABLET | Freq: Four times a day (QID) | ORAL | Status: DC | PRN
  Administered 2013-09-11 – 2013-09-13 (×4): 4 mg via ORAL
  Filled 2013-09-10 (×4): qty 1

## 2013-09-10 MED ORDER — SODIUM CHLORIDE 0.9 % IR SOLN
Status: DC | PRN
  Administered 2013-09-10: 6000 mL

## 2013-09-10 MED ORDER — ONDANSETRON HCL 4 MG/2ML IJ SOLN
4.0000 mg | Freq: Four times a day (QID) | INTRAMUSCULAR | Status: DC | PRN
  Administered 2013-09-10 – 2013-09-12 (×2): 4 mg via INTRAVENOUS
  Filled 2013-09-10 (×2): qty 2

## 2013-09-10 MED ORDER — SODIUM CHLORIDE 0.9 % IV SOLN
INTRAVENOUS | Status: DC
  Administered 2013-09-10: 09:00:00 via INTRAVENOUS

## 2013-09-10 MED ORDER — INSULIN ASPART 100 UNIT/ML ~~LOC~~ SOLN: 0.0000 [IU] | Freq: Three times a day (TID) | SUBCUTANEOUS | Status: DC

## 2013-09-10 MED ORDER — AMOXICILLIN-POT CLAVULANATE 500-125 MG PO TABS
1.0000 | ORAL_TABLET | Freq: Two times a day (BID) | ORAL | Status: DC
  Administered 2013-09-10 (×2): 500 mg via ORAL
  Filled 2013-09-10 (×3): qty 1

## 2013-09-10 MED ORDER — PIPERACILLIN-TAZOBACTAM 3.375 G IVPB
3.3750 g | Freq: Three times a day (TID) | INTRAVENOUS | Status: DC
  Administered 2013-09-10 – 2013-09-12 (×7): 3.375 g via INTRAVENOUS
  Filled 2013-09-10 (×8): qty 50

## 2013-09-10 MED ORDER — ROCURONIUM BROMIDE 100 MG/10ML IV SOLN
INTRAVENOUS | Status: DC | PRN
  Administered 2013-09-09: 5 mg via INTRAVENOUS

## 2013-09-10 MED ORDER — LABETALOL HCL 5 MG/ML IV SOLN
INTRAVENOUS | Status: DC | PRN
  Administered 2013-09-10: 5 mg via INTRAVENOUS

## 2013-09-10 MED ORDER — KETOROLAC TROMETHAMINE 30 MG/ML IJ SOLN: 15.0000 mg | Freq: Once | INTRAMUSCULAR | Status: DC | PRN

## 2013-09-10 MED ORDER — EXENATIDE 10 MCG/0.04ML ~~LOC~~ SOPN
10.0000 ug | PEN_INJECTOR | Freq: Every day | SUBCUTANEOUS | Status: DC
  Administered 2013-09-10: 10 ug via SUBCUTANEOUS

## 2013-09-10 MED ORDER — INSULIN ASPART 100 UNIT/ML ~~LOC~~ SOLN: 6.0000 [IU] | Freq: Three times a day (TID) | SUBCUTANEOUS | Status: DC

## 2013-09-10 MED ORDER — SIMVASTATIN 40 MG PO TABS
40.0000 mg | ORAL_TABLET | Freq: Every evening | ORAL | Status: DC
  Administered 2013-09-10 – 2013-09-11 (×2): 40 mg via ORAL
  Filled 2013-09-10 (×4): qty 1

## 2013-09-10 MED ORDER — ONDANSETRON HCL 4 MG PO TABS: 4.0000 mg | ORAL_TABLET | Freq: Three times a day (TID) | ORAL | Status: DC | PRN

## 2013-09-10 MED ORDER — METOCLOPRAMIDE HCL 5 MG/ML IJ SOLN: 10.0000 mg | Freq: Once | INTRAMUSCULAR | Status: DC | PRN

## 2013-09-10 MED ORDER — OXYCODONE HCL 5 MG PO TABS
5.0000 mg | ORAL_TABLET | ORAL | Status: DC | PRN
  Administered 2013-09-10 (×4): 15 mg via ORAL
  Administered 2013-09-10: 5 mg via ORAL
  Administered 2013-09-10: 10 mg via ORAL
  Administered 2013-09-11 – 2013-09-12 (×7): 15 mg via ORAL
  Filled 2013-09-10: qty 3
  Filled 2013-09-10: qty 2
  Filled 2013-09-10 (×7): qty 3
  Filled 2013-09-10: qty 1
  Filled 2013-09-10 (×3): qty 3

## 2013-09-10 MED ORDER — METOCLOPRAMIDE HCL 10 MG PO TABS
5.0000 mg | ORAL_TABLET | Freq: Three times a day (TID) | ORAL | Status: DC | PRN
  Administered 2013-09-10: 10 mg via ORAL
  Filled 2013-09-10: qty 1

## 2013-09-10 MED ORDER — VANCOMYCIN HCL IN DEXTROSE 1-5 GM/200ML-% IV SOLN: 1000.0000 mg | Freq: Two times a day (BID) | INTRAVENOUS | Status: DC

## 2013-09-10 MED ORDER — FENTANYL CITRATE 0.05 MG/ML IJ SOLN
INTRAMUSCULAR | Status: AC
  Administered 2013-09-10: 50 ug via INTRAVENOUS
  Filled 2013-09-10: qty 2

## 2013-09-10 MED ORDER — ASPIRIN EC 81 MG PO TBEC
81.0000 mg | DELAYED_RELEASE_TABLET | Freq: Every day | ORAL | Status: DC
  Administered 2013-09-10 – 2013-09-13 (×3): 81 mg via ORAL
  Filled 2013-09-10 (×4): qty 1

## 2013-09-10 MED ORDER — PIOGLITAZONE HCL-METFORMIN HCL 15-850 MG PO TABS: 1.0000 | ORAL_TABLET | Freq: Every evening | ORAL | Status: DC

## 2013-09-10 MED ORDER — LIDOCAINE HCL (CARDIAC) 20 MG/ML IV SOLN
INTRAVENOUS | Status: DC | PRN
  Administered 2013-09-09: 100 mg via INTRAVENOUS

## 2013-09-10 MED ORDER — HYDROMORPHONE HCL PF 1 MG/ML IJ SOLN
INTRAMUSCULAR | Status: AC
  Administered 2013-09-10 (×4): 1 mg via INTRAVENOUS
  Filled 2013-09-10: qty 1

## 2013-09-10 MED ORDER — METOCLOPRAMIDE HCL 5 MG/ML IJ SOLN: 5.0000 mg | Freq: Three times a day (TID) | INTRAMUSCULAR | Status: DC | PRN

## 2013-09-10 MED ORDER — PIOGLITAZONE HCL 15 MG PO TABS
15.0000 mg | ORAL_TABLET | Freq: Every day | ORAL | Status: DC
  Administered 2013-09-10 – 2013-09-11 (×2): 15 mg via ORAL
  Filled 2013-09-10 (×4): qty 1

## 2013-09-10 MED ORDER — ONDANSETRON HCL 4 MG/2ML IJ SOLN
INTRAMUSCULAR | Status: DC | PRN
  Administered 2013-09-09: 4 mg via INTRAVENOUS

## 2013-09-10 MED ORDER — INSULIN ASPART 100 UNIT/ML ~~LOC~~ SOLN: 0.0000 [IU] | Freq: Every day | SUBCUTANEOUS | Status: DC

## 2013-09-10 MED ORDER — 0.9 % SODIUM CHLORIDE (POUR BTL) OPTIME
TOPICAL | Status: DC | PRN
  Administered 2013-09-10: 1000 mL

## 2013-09-10 MED ORDER — HYDROMORPHONE HCL PF 1 MG/ML IJ SOLN
0.5000 mg | INTRAMUSCULAR | Status: DC | PRN
  Administered 2013-09-10 (×2): 0.5 mg via INTRAVENOUS
  Administered 2013-09-10: 1 mg via INTRAVENOUS
  Administered 2013-09-12: 0.5 mg via INTRAVENOUS
  Filled 2013-09-10 (×4): qty 1

## 2013-09-10 MED ORDER — RAMIPRIL 10 MG PO CAPS
20.0000 mg | ORAL_CAPSULE | Freq: Every evening | ORAL | Status: DC
  Administered 2013-09-10 – 2013-09-11 (×2): 20 mg via ORAL
  Filled 2013-09-10 (×4): qty 2

## 2013-09-10 MED ORDER — FENTANYL CITRATE 0.05 MG/ML IJ SOLN
25.0000 ug | INTRAMUSCULAR | Status: DC | PRN
  Administered 2013-09-10 (×2): 50 ug via INTRAVENOUS

## 2013-09-10 NOTE — Anesthesia Postprocedure Evaluation (Signed)
  Anesthesia Post-op Note  Anesthesia Post Note  Patient: Kelly [name]  Procedure(s) Performed: Procedure(s) (LRB): IRRIGATION AND DEBRIDEMENT HIP (Right)  Anesthesia type: General  Patient location: PACU  Post pain: Pain level controlled  Post assessment: Post-op Vital signs reviewed  Last Vitals:  Filed Vitals:   09/10/13 0030  BP: 133/60  Pulse:   Temp: 36.6 C  Resp:     Post vital signs: Reviewed  Level of consciousness: sedated  Complications: No apparent anesthesia complications

## 2013-09-10 NOTE — Op Note (Signed)
NAMEKEYLA, MILONE NO.:  [id number]  MEDICAL RECORD NO.:  [id number]  LOCATION:  [facility]                         FACILITY:  [facility]  PHYSICIAN:  Almedia Balls. Ranell Patrick, M.D. DATE OF BIRTH:  [birth year]  DATE OF PROCEDURE:  09/09/2013 DATE OF DISCHARGE:                              OPERATIVE REPORT   PREOPERATIVE DIAGNOSIS:  Right hip infection.  POSTOPERATIVE DIAGNOSIS:  Right hip infection.  PROCEDURE PERFORMED:  Right hip I and D, deep to subcu, but superficial to the fascia.  ATTENDING SURGEON:  Almedia Balls. Ranell Patrick, M.D.  ASSISTANT:  None.  ANESTHESIA:  General anesthesia was used.  ESTIMATED BLOOD LOSS:  Minimal.  FLUID REPLACEMENT:  1200 mL of crystalloid.  INSTRUMENT COUNTS:  Correct.  COMPLICATIONS:  There were no complications.  ANTIBIOTICS:  Perioperative antibiotics were given.  INDICATIONS:  The patient is a 67 year old female with a history of right total hip replacement by Dr. Ollen Gross back in July.  The patient has had postoperative course complicated by postoperative infection.  The patient has had an I and D, was reported to be superficial, performed by Dr. Lequita Halt.  The patient represents with purulent drainage coming from her right hip for several days.  The patient was just seen earlier this week by Dr. Lequita Halt.  The patient now presents for operative I and D of her hip.  She has not been febrile at home or any significant increase in pain until last day or 2.  Physical exam today shows an obviously purulent open wound dehiscing on her right hip.  After counseling the patient regarding the need to perform the I and D, the patient consented and informed consent obtained.  DESCRIPTION OF PROCEDURE:  After an adequate level of anesthesia was achieved, the patient was positioned in the left lateral decubitus position with the right hip up.  We did a sterile prep and drape of the hip using Betadine soap and paint.  We then went ahead and  called the time-out.  We then did a sharp incision and drainage, opening up the area that measured at least 7 cm long of deep wound dehiscence.  We excised some necrotic tissue at the wound edge, removed suture material. We sharply dissected down to what amounted to be a deep subcutaneous dissection about 2-3 cm down.  We did pulse irrigation of 3 liters of normal saline irrigation.  I then pushed on the posterior buttock area and dark material came up from deeper.  We had obtained cultures from the superficial wound and I was able to take my finger in the proximal portion of the incision and actually tracked along that fluid and this went to a deeper cavity.  We opened this up and this did not appear to penetrate down through fascia, but we were now at least 6-7 cm deep.  We went ahead and removed some additional suture material.  We pulse irrigated with another 3 liters of this deeper layer and at this point, felt like we definitely need to do some packing for this wound to control dead space.  So, we closed the ends with interrupted nylon closure and then used packing centrally with a moistened Kerlix dressing  with normal saline, and at this point, with that wound packed and a sterile compressive bandage, we awakened the patient from Surgery, transported her to the recovery room in stable condition.     Almedia Balls. Ranell Patrick, M.D.     SRN/MEDQ  D:  09/10/2013  T:  09/10/2013  Job:  [id number]

## 2013-09-10 NOTE — Brief Op Note (Signed)
09/09/2013 - 09/10/2013  12:36 AM  PATIENT:  Kelly [name]  67 y.o. female  PRE-OPERATIVE DIAGNOSIS:  RIGHT HIP INFECTION  POST-OPERATIVE DIAGNOSIS:  right hip infection  PROCEDURE:  Procedure(s): IRRIGATION AND DEBRIDEMENT HIP (Right)  SURGEON:  Surgeon(s) and Role:    * Verlee Rossetti, MD - Primary  PHYSICIAN ASSISTANT:   ASSISTANTS: none   ANESTHESIA:   general  EBL:  Total I/O In: 1300 [I.V.:1300] Out: 200 [Urine:200]  BLOOD ADMINISTERED:none  DRAINS: none   LOCAL MEDICATIONS USED:  NONE  SPECIMEN:  Source of Specimen:  right hip superficial and deep  DISPOSITION OF SPECIMEN:  micro  COUNTS:  YES  TOURNIQUET:    DICTATION: .Other Dictation: Dictation Number [id number]  PLAN OF CARE: Admit to inpatient   PATIENT DISPOSITION:  PACU - hemodynamically stable.   Delay start of Pharmacological VTE agent (>24hrs) due to surgical blood loss or risk of bleeding: no

## 2013-09-10 NOTE — Progress Notes (Signed)
Subjective: 1 Day Post-Op Procedure(s) (LRB): IRRIGATION AND DEBRIDEMENT HIP (Right) Patient reports pain as mild.  Some nausea.  C/o [place] o2.  Objective: Vital signs in last 24 hours: Temp:  [97.7 F (36.5 C)-98.4 F (36.9 C)] 97.7 F (36.5 C) (10/04 0431) Pulse Rate:  [67-84] 68 (10/04 0431) Resp:  [14-18] 16 (10/04 0431) BP: (138-174)/(56-81) 164/79 mmHg (10/04 0431) SpO2:  [10 %-100 %] 10 % (10/04 0431) Weight:  [114.76 kg (253 lb)] 114.76 kg (253 lb) (10/03 1245)  Intake/Output from previous day: 10/03 0701 - 10/04 0700 In: 2630 [I.V.:2030; IV Piggyback:600] Out: 425 [Urine:425] Intake/Output this shift:     Recent Labs  09/09/13 1250 09/10/13 0410  HGB 11.0* 9.7*    Recent Labs  09/09/13 1250 09/10/13 0410  WBC 8.5  --   RBC 4.01  --   HCT 34.1* 30.5*  PLT 384  --     Recent Labs  09/09/13 1250 09/10/13 0410  NA 141 139  K 4.3 4.1  CL 104 105  CO2 26 26  BUN 12 10  CREATININE 0.51 0.42*  GLUCOSE 151* 161*  CALCIUM 9.7 8.6    Recent Labs  09/09/13 1250  INR 1.04   Intra op cxs - no results yet.  wn wd woma in nad.  R hip dressed with some SS drainange.  NV intact at R LE.  Assessment/Plan: 1 Day Post-Op Procedure(s) (LRB): IRRIGATION AND DEBRIDEMENT HIP (Right) Wean O2.  Follow cxs.  Continue abx.  Toni Arthurs 09/10/2013, 7:36 AM

## 2013-09-10 NOTE — Progress Notes (Signed)
ANTIBIOTIC CONSULT NOTE   Pharmacy Consult for vancomycin/zosyn Indication: incisional infection  Allergies  Allergen Reactions  . Robaxin [Methocarbamol] Nausea And Vomiting    Patient Measurements: Height: 5\' 1"  (154.9 cm) Weight: 253 lb (114.76 kg) IBW/kg (Calculated) : 47.8  Vital Signs: Temp: 97.6 F (36.4 C) (10/04 1035) Temp src: Oral (10/04 1035) BP: 156/81 mmHg (10/04 1035) Pulse Rate: 75 (10/04 1035) Intake/Output from previous day: 10/03 0701 - 10/04 0700 In: 2630 [I.V.:2030; IV Piggyback:600] Out: 425 [Urine:425] Intake/Output from this shift: Total I/O In: 60 [P.O.:60] Out: 150 [Urine:150]  Labs:  Recent Labs  09/09/13 1250 09/10/13 0410  WBC 8.5  --   HGB 11.0* 9.7*  PLT 384  --   CREATININE 0.51 0.42*   Estimated Creatinine Clearance: 81.5 ml/min (by C-G formula based on Cr of 0.42). No results found for this basename: VANCOTROUGH, VANCOPEAK, VANCORANDOM, GENTTROUGH, GENTPEAK, GENTRANDOM, TOBRATROUGH, TOBRAPEAK, TOBRARND, AMIKACINPEAK, AMIKACINTROU, AMIKACIN,  in the last 72 hours   Microbiology: Recent Results (from the past 720 hour(s))  WOUND CULTURE     Status: None   Collection Time    08/15/13  5:13 PM      Result Value Range Status   Specimen Description WOUND SEROMA   Final   Special Requests RIGHT HIP   Final   Gram Stain     Final   Value: NO WBC SEEN     NO SQUAMOUS EPITHELIAL CELLS SEEN     NO ORGANISMS SEEN     Performed at [facility]   Culture     Final   Value: RARE PROTEUS MIRABILIS     Performed at [facility]   Report Status 08/18/2013 FINAL   Final   Organism ID, Bacteria PROTEUS MIRABILIS   Final  ANAEROBIC CULTURE     Status: None   Collection Time    08/15/13  5:13 PM      Result Value Range Status   Specimen Description WOUND SEROMA   Final   Special Requests RIGHT HIP   Final   Gram Stain     Final   Value: NO WBC SEEN     NO SQUAMOUS EPITHELIAL CELLS SEEN     NO ORGANISMS SEEN   Performed at [facility]   Culture     Final   Value: NO ANAEROBES ISOLATED     Performed at [facility]   Report Status 08/20/2013 FINAL   Final  SURGICAL PCR SCREEN     Status: None   Collection Time    09/09/13  6:14 PM      Result Value Range Status   MRSA, PCR NEGATIVE  NEGATIVE Final   Staphylococcus aureus NEGATIVE  NEGATIVE Final   Comment:            The Xpert SA Assay (FDA     approved for NASAL specimens     in patients over 2 years of age),     is one component of     a comprehensive surveillance     program.  Test performance has     been validated by [employer] for patients greater     than or equal to 60 year old.     It is not intended     to diagnose infection nor to     guide or monitor treatment.     Performed at [facility]    Medical History: Past Medical History  Diagnosis Date  . Diabetes mellitus without complication   . Arthritis   . Hypertension   . Heart murmur    Assessment: 67 YOF admitted from ortho office with drainage from R hip. S/p R THA in 06/2013 with drainage several weeks after surgery, s/p I&D 08/15/13, again 10/4. Per notes, does not appear infection has involved the joint.  Outpt augmentin 9/8 - 10/4 10/3 >> cefazolin >> 10/4 10/3 >>vancomycin >> 10/4 >> Zosyn >>   Tmax: 98.4 WBCs: 8.5 Renal: 0.51 for CrCl = 98ml/min (C-G) and >149ml/min (Normalized w/o adjusting SCR for age)  9/8 wound: proteus pan-sensitive (pt started on augmentin at this point) 10/3 urine:  10/3 wound (done in office):  10/4: Wound >> 10/4: Anaerobic >>  Goal of Therapy:  Vancomycin trough level 15-20 mcg/ml  Plan:   Continue Vancomycin 1 gram IV q 12 hours Zosyn 3.375g IV q8h (infuse over 4 hours) F/u renal fxn, vancomycin trough at Css  Haynes Hoehn, PharmD 09/10/2013, 11:52 AM  Pager: [phone]

## 2013-09-10 NOTE — Transfer of Care (Signed)
Immediate Anesthesia Transfer of Care Note  Patient: Kelly [name]  Procedure(s) Performed: Procedure(s): IRRIGATION AND DEBRIDEMENT HIP (Right)  Patient Location: PACU  Anesthesia Type:General  Level of Consciousness: awake, alert , oriented and patient cooperative  Airway & Oxygen Therapy: Patient Spontanous Breathing and Patient connected to face mask oxygen  Post-op Assessment: Report given to PACU RN, Post -op Vital signs reviewed and stable and Patient moving all extremities X 4  Post vital signs: stable  Complications: No apparent anesthesia complications

## 2013-09-11 LAB — GLUCOSE, CAPILLARY
Glucose-Capillary: 144 mg/dL — ABNORMAL HIGH (ref 70–99)
Glucose-Capillary: 121 mg/dL — ABNORMAL HIGH (ref 70–99)
Glucose-Capillary: 139 mg/dL — ABNORMAL HIGH (ref 70–99)

## 2013-09-11 LAB — BASIC METABOLIC PANEL
BUN: 13 mg/dL (ref 6–23)
CO2: 23 mEq/L (ref 19–32)
GFR calc non Af Amer: 89 mL/min — ABNORMAL LOW (ref >90)
Glucose, Bld: 142 mg/dL — ABNORMAL HIGH (ref 70–99)
Potassium: 4.3 mEq/L (ref 3.5–5.1)
Sodium: 136 mEq/L (ref 135–145)

## 2013-09-11 NOTE — Progress Notes (Signed)
   Subjective: 2 Days Post-Op Procedure(s) (LRB): IRRIGATION AND DEBRIDEMENT HIP (Right)  Pt c/o mild to moderate pain at times Otherwise doing okay Patient reports pain as moderate.  Objective:   VITALS:   Filed Vitals:   09/11/13 0500  BP: 137/60  Pulse: 70  Temp: 98.1 F (36.7 C)  Resp: 16    Right hip incision healing Dressing changed and repacked wet to dry nv intact distally No rashes distally  LABS  Recent Labs  09/09/13 1250 09/10/13 0410  HGB 11.0* 9.7*  HCT 34.1* 30.5*  WBC 8.5  --   PLT 384  --      Recent Labs  09/09/13 1250 09/10/13 0410 09/11/13 0417  NA 141 139 136  K 4.3 4.1 4.3  BUN 12 10 13   CREATININE 0.51 0.42* 0.69  GLUCOSE 151* 161* 142*     Assessment/Plan: 2 Days Post-Op Procedure(s) (LRB): IRRIGATION AND DEBRIDEMENT HIP (Right)  Daily dressing and packing changes Continue IV antibiotics Pain management Pulmonary toilet   Brad Dixon, MPAS, PA-C  09/11/2013, 7:15 AM

## 2013-09-11 NOTE — Progress Notes (Signed)
Orthopedics Progress Note  Subjective: I feel better this evening  Objective:  Filed Vitals:   09/11/13 2100  BP: 136/74  Pulse: 82  Temp: 98.5 F (36.9 C)  Resp: 16    General: Awake and alert  Musculoskeletal: ankle pumps without pain bilaterally Neurovascularly intact  Lab Results  Component Value Date   WBC 8.5 09/09/2013   HGB 9.7* 09/10/2013   HCT 30.5* 09/10/2013   MCV 85.0 09/09/2013   PLT 384 09/09/2013       Component Value Date/Time   NA 136 09/11/2013 0417   K 4.3 09/11/2013 0417   CL 102 09/11/2013 0417   CO2 23 09/11/2013 0417   GLUCOSE 142* 09/11/2013 0417   BUN 13 09/11/2013 0417   CREATININE 0.69 09/11/2013 0417   CALCIUM 8.9 09/11/2013 0417   GFRNONAA 89* 09/11/2013 0417   GFRAA >90 09/11/2013 0417    Lab Results  Component Value Date   INR 1.04 09/09/2013    Assessment/Plan: POD #2 s/p Procedure(s): IRRIGATION AND DEBRIDEMENT HIP Patient discussed with Dr Despina Hick who plans to do a repeat I+D tomorrow and possible delayed primary closure in OR Will keep patient npo after midnight.  Almedia Balls. Ranell Patrick, MD 09/11/2013 9:33 PM

## 2013-09-12 ENCOUNTER — Encounter (HOSPITAL_COMMUNITY): Payer: Self-pay | Admitting: Orthopedic Surgery

## 2013-09-12 ENCOUNTER — Encounter (HOSPITAL_COMMUNITY): Payer: Self-pay | Admitting: Certified Registered"

## 2013-09-12 ENCOUNTER — Inpatient Hospital Stay (HOSPITAL_COMMUNITY): Payer: BC Managed Care – PPO | Admitting: Certified Registered"

## 2013-09-12 ENCOUNTER — Encounter (HOSPITAL_COMMUNITY)
Admission: AD | Disposition: A | Discharge status: Home / Self Care | Payer: Self-pay | Source: Ambulatory Visit | Attending: Orthopedic Surgery

## 2013-09-12 HISTORY — PX: I & D EXTREMITY: SHX5045

## 2013-09-12 LAB — WOUND CULTURE
Culture: NO GROWTH
Culture: NO GROWTH
Gram Stain: NONE SEEN

## 2013-09-12 LAB — GLUCOSE, CAPILLARY
Glucose-Capillary: 111 mg/dL — ABNORMAL HIGH (ref 70–99)
Glucose-Capillary: 92 mg/dL (ref 70–99)

## 2013-09-12 LAB — BASIC METABOLIC PANEL
Calcium: 9.2 mg/dL (ref 8.4–10.5)
GFR calc Af Amer: >90 mL/min (ref >90)
GFR calc non Af Amer: >90 mL/min (ref >90)
Glucose, Bld: 153 mg/dL — ABNORMAL HIGH (ref 70–99)
Potassium: 4.6 mEq/L (ref 3.5–5.1)
Sodium: 138 mEq/L (ref 135–145)

## 2013-09-12 SURGERY — IRRIGATION AND DEBRIDEMENT EXTREMITY
Anesthesia: General | Site: Hip | Laterality: Right | Wound class: Clean Contaminated

## 2013-09-12 MED ORDER — FENTANYL CITRATE 0.05 MG/ML IJ SOLN
25.0000 ug | INTRAMUSCULAR | Status: DC | PRN
  Administered 2013-09-12 (×2): 50 ug via INTRAVENOUS

## 2013-09-12 MED ORDER — FENTANYL CITRATE 0.05 MG/ML IJ SOLN
INTRAMUSCULAR | Status: DC | PRN
  Administered 2013-09-12 (×2): 50 ug via INTRAVENOUS

## 2013-09-12 MED ORDER — FENTANYL CITRATE 0.05 MG/ML IJ SOLN
INTRAMUSCULAR | Status: AC
  Filled 2013-09-12: qty 2

## 2013-09-12 MED ORDER — BISACODYL 10 MG RE SUPP: 10.0000 mg | Freq: Every day | RECTAL | Status: DC | PRN

## 2013-09-12 MED ORDER — POLYETHYLENE GLYCOL 3350 17 G PO PACK: 17.0000 g | PACK | Freq: Every day | ORAL | Status: DC | PRN

## 2013-09-12 MED ORDER — ONDANSETRON HCL 4 MG/2ML IJ SOLN: 4.0000 mg | Freq: Four times a day (QID) | INTRAMUSCULAR | Status: DC | PRN

## 2013-09-12 MED ORDER — LIDOCAINE HCL (CARDIAC) 20 MG/ML IV SOLN
INTRAVENOUS | Status: DC | PRN
  Administered 2013-09-12: 50 mg via INTRAVENOUS

## 2013-09-12 MED ORDER — METOCLOPRAMIDE HCL 10 MG PO TABS: 5.0000 mg | ORAL_TABLET | Freq: Three times a day (TID) | ORAL | Status: DC | PRN

## 2013-09-12 MED ORDER — SODIUM CHLORIDE 0.9 % IV SOLN: INTRAVENOUS | Status: DC

## 2013-09-12 MED ORDER — HYDROMORPHONE HCL PF 1 MG/ML IJ SOLN
0.2500 mg | INTRAMUSCULAR | Status: DC | PRN
  Administered 2013-09-12 (×3): 0.5 mg via INTRAVENOUS

## 2013-09-12 MED ORDER — HYDROMORPHONE HCL PF 1 MG/ML IJ SOLN
INTRAMUSCULAR | Status: AC
  Filled 2013-09-12: qty 1

## 2013-09-12 MED ORDER — ONDANSETRON HCL 4 MG PO TABS: 4.0000 mg | ORAL_TABLET | Freq: Four times a day (QID) | ORAL | Status: DC | PRN

## 2013-09-12 MED ORDER — CEFAZOLIN SODIUM-DEXTROSE 2-3 GM-% IV SOLR
2.0000 g | Freq: Four times a day (QID) | INTRAVENOUS | Status: AC
  Administered 2013-09-12 – 2013-09-13 (×3): 2 g via INTRAVENOUS
  Filled 2013-09-12 (×3): qty 50

## 2013-09-12 MED ORDER — SODIUM CHLORIDE 0.9 % IR SOLN
Status: DC | PRN
  Administered 2013-09-12: 3000 mL

## 2013-09-12 MED ORDER — LACTATED RINGERS IV SOLN: INTRAVENOUS | Status: DC

## 2013-09-12 MED ORDER — TRAMADOL HCL 50 MG PO TABS: 50.0000 mg | ORAL_TABLET | Freq: Four times a day (QID) | ORAL | Status: DC | PRN

## 2013-09-12 MED ORDER — METOCLOPRAMIDE HCL 5 MG/ML IJ SOLN: 5.0000 mg | Freq: Three times a day (TID) | INTRAMUSCULAR | Status: DC | PRN

## 2013-09-12 MED ORDER — MIDAZOLAM HCL 5 MG/5ML IJ SOLN
INTRAMUSCULAR | Status: DC | PRN
  Administered 2013-09-12: 2 mg via INTRAVENOUS

## 2013-09-12 MED ORDER — 0.9 % SODIUM CHLORIDE (POUR BTL) OPTIME
TOPICAL | Status: DC | PRN
  Administered 2013-09-12: 1000 mL

## 2013-09-12 MED ORDER — ONDANSETRON HCL 4 MG/2ML IJ SOLN
INTRAMUSCULAR | Status: DC | PRN
  Administered 2013-09-12: 4 mg via INTRAMUSCULAR

## 2013-09-12 MED ORDER — PROPOFOL 10 MG/ML IV BOLUS
INTRAVENOUS | Status: DC | PRN
  Administered 2013-09-12: 150 mg via INTRAVENOUS

## 2013-09-12 MED ORDER — LACTATED RINGERS IV SOLN
INTRAVENOUS | Status: DC
  Administered 2013-09-12: 1000 mL via INTRAVENOUS

## 2013-09-12 MED ORDER — FLEET ENEMA 7-19 GM/118ML RE ENEM: 1.0000 | ENEMA | Freq: Once | RECTAL | Status: AC | PRN

## 2013-09-12 MED ORDER — ENOXAPARIN SODIUM 40 MG/0.4ML ~~LOC~~ SOLN
40.0000 mg | SUBCUTANEOUS | Status: DC
  Administered 2013-09-13: 40 mg via SUBCUTANEOUS
  Filled 2013-09-12 (×2): qty 0.4

## 2013-09-12 SURGICAL SUPPLY — 44 items
BAG SPEC THK2 15X12 ZIP CLS (MISCELLANEOUS)
BAG ZIPLOCK 12X15 (MISCELLANEOUS) IMPLANT
CLOTH BEACON ORANGE TIMEOUT ST (SAFETY) IMPLANT
CONT SPECI 4OZ STER CLIK (MISCELLANEOUS) IMPLANT
DRAIN CHANNEL 15F RND FF 3/16 (WOUND CARE) ×2 IMPLANT
DRAPE INCISE IOBAN 66X45 STRL (DRAPES) IMPLANT
DRAPE ORTHO SPLIT 77X108 STRL (DRAPES) ×2
DRAPE SURG ORHT 6 SPLT 77X108 (DRAPES) ×1 IMPLANT
DRAPE U-SHAPE 47X51 STRL (DRAPES) ×2 IMPLANT
DRSG ADAPTIC 3X8 NADH LF (GAUZE/BANDAGES/DRESSINGS) IMPLANT
DRSG EMULSION OIL 3X16 NADH (GAUZE/BANDAGES/DRESSINGS) ×2 IMPLANT
DRSG MEPILEX BORDER 4X4 (GAUZE/BANDAGES/DRESSINGS) ×2 IMPLANT
DRSG MEPILEX BORDER 4X8 (GAUZE/BANDAGES/DRESSINGS) ×2 IMPLANT
DURAPREP 26ML APPLICATOR (WOUND CARE) ×2 IMPLANT
ELECT REM PT RETURN 9FT ADLT (ELECTROSURGICAL) ×2
ELECTRODE REM PT RTRN 9FT ADLT (ELECTROSURGICAL) ×1 IMPLANT
EVACUATOR 1/8 PVC DRAIN (DRAIN) IMPLANT
EVACUATOR DRAINAGE 7X20 100CC (MISCELLANEOUS) ×1 IMPLANT
EVACUATOR SILICONE 100CC (MISCELLANEOUS) ×2
FACESHIELD LNG OPTICON STERILE (SAFETY) ×6 IMPLANT
GLOVE BIO SURGEON STRL SZ7.5 (GLOVE) ×2 IMPLANT
GLOVE BIO SURGEON STRL SZ8 (GLOVE) ×4 IMPLANT
GLOVE BIOGEL PI IND STRL 8 (GLOVE) ×3 IMPLANT
GLOVE BIOGEL PI INDICATOR 8 (GLOVE) ×3
GOWN PREVENTION PLUS LG XLONG (DISPOSABLE) ×2 IMPLANT
GOWN STRL REIN XL XLG (GOWN DISPOSABLE) ×4 IMPLANT
HANDPIECE INTERPULSE COAX TIP (DISPOSABLE) ×2
KIT BASIN OR (CUSTOM PROCEDURE TRAY) ×2 IMPLANT
MANIFOLD NEPTUNE II (INSTRUMENTS) ×2 IMPLANT
PACK TOTAL JOINT (CUSTOM PROCEDURE TRAY) ×2 IMPLANT
POSITIONER SURGICAL ARM (MISCELLANEOUS) ×4 IMPLANT
SET HNDPC FAN SPRY TIP SCT (DISPOSABLE) ×1 IMPLANT
SPONGE GAUZE 4X4 12PLY (GAUZE/BANDAGES/DRESSINGS) ×2 IMPLANT
STAPLER VISISTAT 35W (STAPLE) IMPLANT
SUT ETHILON 3 0 PS 1 (SUTURE) ×6 IMPLANT
SUT MNCRL AB 4-0 PS2 18 (SUTURE) IMPLANT
SUT VIC AB 1 CT1 27 (SUTURE)
SUT VIC AB 1 CT1 27XBRD ANTBC (SUTURE) IMPLANT
SUT VIC AB 1 CT1 36 (SUTURE) ×6 IMPLANT
SUT VIC AB 2-0 CT1 27 (SUTURE) ×4
SUT VIC AB 2-0 CT1 TAPERPNT 27 (SUTURE) ×2 IMPLANT
SWAB COLLECTION DEVICE MRSA (MISCELLANEOUS) IMPLANT
TOWEL OR 17X26 10 PK STRL BLUE (TOWEL DISPOSABLE) ×2 IMPLANT
TUBE ANAEROBIC SPECIMEN COL (MISCELLANEOUS) IMPLANT

## 2013-09-12 NOTE — H&P (View-Only) (Signed)
Kelly [name] is an 67 y.o. female.   Chief Complaint: drainage from right hip HPI: Kelly [name] is a 67 year old female who presented to the office with the chief complaint of increased drainage from her incision of her right total hip and subsequent I&D. She had a right total hip arthroplasty on July 07, 2013. She did well initially but developed some serous drainage from her incision after a couple weeks. She was treated with oral antibiotics, but at her 5 week check-up she had some wound necrosis and skin breakdown. She was taken to the OR where Dr. Aluisio performed an I&D on 08/15/2013. She has been following Dr. Aluisio in the office at that time and has been on Augmentin. She has had some pinpoint drainage from the wound, but noticed some increased drainage and opening of the wound yesterday. She also noticed some pus around the wound. She denies fevers and chills. She presented to our office for evaluation. Due to her history the current appearance of the wound, we elected to admit her to possible repeat I&D as well as IV antibiotic coverage. Cultures were taken in the office.   Past Medical History  Diagnosis Date  . Diabetes mellitus without complication   . Arthritis   . Hypertension   . Heart murmur     Past Surgical History  Procedure Laterality Date  . Total hip arthroplasty Right 07/07/2013    Procedure: RIGHT TOTAL HIP ARTHROPLASTY;  Surgeon: Frank V Aluisio, MD;  Location: [facility];  Service: Orthopedics;  Laterality: Right;  . I&d extremity Right 08/15/2013    Procedure: INCISION AND DRAINAGE RIGHT HIP INFECTION (PREVIOUS RIGHT TOTAL HIP REPLACEMENT);  Surgeon: Frank V Aluisio, MD;  Location: [facility];  Service: Orthopedics;  Laterality: Right;    Social History:  reports that she has never smoked. She has never used smokeless tobacco. She reports that she does not drink alcohol or use illicit drugs.  Allergies:  Allergies  Allergen Reactions  . Robaxin [Methocarbamol] Nausea And  Vomiting    Review of Systems  Constitutional: Negative.   HENT: Negative.  Negative for neck pain.   Eyes: Negative.   Respiratory: Negative.   Cardiovascular: Negative.   Gastrointestinal: Negative.   Genitourinary: Negative.   Musculoskeletal: Positive for joint pain. Negative for myalgias, back pain and falls.       Right hip pain  Skin: Negative.   Neurological: Negative.   Endo/Heme/Allergies: Negative.   Psychiatric/Behavioral: Negative.    Physical Exam  Constitutional: She is oriented to person, place, and time. She appears well-developed and well-nourished. No distress.  HENT:  Head: Normocephalic and atraumatic.  Right Ear: External ear normal.  Left Ear: External ear normal.  Nose: Nose normal.  Mouth/Throat: Oropharynx is clear and moist.  Eyes: EOM are normal.  Neck: Normal range of motion. Neck supple.  Cardiovascular: Normal rate, regular rhythm, normal heart sounds and intact distal pulses.   Respiratory: Effort normal and breath sounds normal. No respiratory distress.  GI: Soft. Bowel sounds are normal. She exhibits no distension. There is no tenderness.  Musculoskeletal:       Right hip: She exhibits decreased range of motion, decreased strength and swelling.       Left hip: Normal.       Right knee: Normal.       Left knee: Normal.       Right lower leg: She exhibits no tenderness and no swelling.       Left lower leg:   She exhibits no tenderness and no swelling.  Neurological: She is alert and oriented to person, place, and time. She has normal strength and normal reflexes. No sensory deficit.  Skin: Skin is warm. No rash noted. She is not diaphoretic. There is erythema.     Psychiatric: She has a normal mood and affect. Her behavior is normal.     Assessment/Plan Kelly [name] has a history of infection surrounding her right total hip. At the present time, it appears to be superficial to the joint. However, we have to be cautious with this. We are  admitting her for IV antibiotics and possible repeat I&D. Dr. Norris is on call today. We will have him evaluate her in the hospital to see if he feels that she needs an I&D of the right hip. We will make her NPO until a decision is made about surgery. Will continue to follow during hospital admission.   Kelly [name] LAUREN 09/09/2013, 11:40 AM    

## 2013-09-12 NOTE — Anesthesia Preprocedure Evaluation (Addendum)
Anesthesia Evaluation  Patient identified by MRN, date of birth, ID band Patient awake    Reviewed: Allergy & Precautions, H&P , NPO status , Patient's Chart, lab work & pertinent test results  Airway Mallampati: II TM Distance: >3 FB Neck ROM: full    Dental no notable dental hx. (+) Teeth Intact and Dental Advisory Given   Pulmonary neg pulmonary ROS,  breath sounds clear to auscultation  Pulmonary exam normal       Cardiovascular hypertension, Pt. on medications Rhythm:regular Rate:Normal     Neuro/Psych negative neurological ROS  negative psych ROS   GI/Hepatic negative GI ROS, Neg liver ROS,   Endo/Other  negative endocrine ROSdiabetes, Well Controlled, Type 2, Oral Hypoglycemic AgentsMorbid obesity  Renal/GU negative Renal ROS  negative genitourinary   Musculoskeletal   Abdominal (+) + obese,   Peds  Hematology negative hematology ROS (+) Blood dyscrasia, anemia , hgb 9.7   Anesthesia Other Findings   Reproductive/Obstetrics negative OB ROS                          Anesthesia Physical Anesthesia Plan  ASA: II  Anesthesia Plan: General   Post-op Pain Management:    Induction: Intravenous  Airway Management Planned: LMA  Additional Equipment:   Intra-op Plan:   Post-operative Plan:   Informed Consent: I have reviewed the patients History and Physical, chart, labs and discussed the procedure including the risks, benefits and alternatives for the proposed anesthesia with the patient or authorized representative who has indicated his/her understanding and acceptance.   Dental Advisory Given  Plan Discussed with: CRNA and Surgeon  Anesthesia Plan Comments:         Anesthesia Quick Evaluation

## 2013-09-12 NOTE — Brief Op Note (Signed)
09/09/2013 - 09/12/2013  5:03 PM  PATIENT:  Kelly [name]  67 y.o. female  PRE-OPERATIVE DIAGNOSIS:  right thigh debridiment  POST-OPERATIVE DIAGNOSIS:  right thigh debridiment  PROCEDURE:  Procedure(s): IRRIGATION AND DEBRIDEMENT RIGHT  LATERAL THIGH (Right)  SURGEON:  Surgeon(s) and Role:    * Loanne Drilling, MD - Primary  PHYSICIAN ASSISTANT:   ASSISTANTS: Avel Peace, PA-C   ANESTHESIA:   general  EBL:     BLOOD ADMINISTERED:none  DRAINS: (15 [place]) Blake drain(s) in the right thigh   LOCAL MEDICATIONS USED:  NONE  SPECIMEN:  No Specimen  DISPOSITION OF SPECIMEN:  N/A  COUNTS:  YES  TOURNIQUET:  * No tourniquets in log *  DICTATION: .Other Dictation: Dictation Number [id number]  PLAN OF CARE: Admit to inpatient   PATIENT DISPOSITION:  PACU - hemodynamically stable.

## 2013-09-12 NOTE — Preoperative (Signed)
Beta Blockers   Reason not to administer Beta Blockers:Not Applicable 

## 2013-09-12 NOTE — Transfer of Care (Signed)
Immediate Anesthesia Transfer of Care Note  Patient: Kelly [name]  Procedure(s) Performed: Procedure(s) (LRB): IRRIGATION AND DEBRIDEMENT RIGHT  LATERAL THIGH (Right)  Patient Location: PACU  Anesthesia Type: General  Level of Consciousness: sedated, patient cooperative and responds to stimulation  Airway & Oxygen Therapy: Patient Spontanous Breathing and Patient connected to face mask oxgen  Post-op Assessment: Report given to PACU RN and Post -op Vital signs reviewed and stable  Post vital signs: Reviewed and stable  Complications: No apparent anesthesia complications

## 2013-09-12 NOTE — Interval H&P Note (Signed)
History and Physical Interval Note:  09/12/2013 3:05 PM  Kelly [name]  has presented today for surgery, with the diagnosis of right thigh debridiment  The various methods of treatment have been discussed with the patient and family. After consideration of risks, benefits and other options for treatment, the patient has consented to  Procedure(s): IRRIGATION AND DEBRIDEMENT RIGHT  LATERAL THIGH (Right) as a surgical intervention .  The patient's history has been reviewed, patient examined, no change in status, stable for surgery.  I have reviewed the patient's chart and labs.  Questions were answered to the patient's satisfaction.     Loanne Drilling

## 2013-09-12 NOTE — Progress Notes (Signed)
Subjective: 3 Days Post-Op Procedure(s) (LRB): IRRIGATION AND DEBRIDEMENT HIP (Right) Patient reports pain as mild.    Objective: Vital signs in last 24 hours: Temp:  [97.9 F (36.6 C)-98.8 F (37.1 C)] 97.9 F (36.6 C) (10/06 0510) Pulse Rate:  [74-104] 74 (10/06 0510) Resp:  [16-20] 20 (10/06 0510) BP: (129-154)/(74-83) 154/81 mmHg (10/06 0510) SpO2:  [96 %-97 %] 97 % (10/06 0510)  Intake/Output from previous day: 10/05 0701 - 10/06 0700 In: 1350 [P.O.:600; IV Piggyback:750] Out: -  Intake/Output this shift:     Recent Labs  09/09/13 1250 09/10/13 0410  HGB 11.0* 9.7*    Recent Labs  09/09/13 1250 09/10/13 0410  WBC 8.5  --   RBC 4.01  --   HCT 34.1* 30.5*  PLT 384  --     Recent Labs  09/11/13 0417 09/12/13 0410  NA 136 138  K 4.3 4.6  CL 102 105  CO2 23 20  BUN 13 13  CREATININE 0.69 0.64  GLUCOSE 142* 153*  CALCIUM 8.9 9.2    Recent Labs  09/09/13 1250  INR 1.04    Right thigh bandage intact with slight drainage  Assessment/Plan: 3 Days Post-Op Procedure(s) (LRB): IRRIGATION AND DEBRIDEMENT HIP (Right) Surgery today for repeat irrigation and debridement and wound closure  Kelly [name] V 09/12/2013, 7:07 AM

## 2013-09-12 NOTE — Anesthesia Postprocedure Evaluation (Signed)
  Anesthesia Post-op Note  Patient: Kelly [name]  Procedure(s) Performed: Procedure(s) (LRB): IRRIGATION AND DEBRIDEMENT RIGHT  LATERAL THIGH (Right)  Patient Location: PACU  Anesthesia Type: General  Level of Consciousness: awake and alert   Airway and Oxygen Therapy: Patient Spontanous Breathing  Post-op Pain: mild  Post-op Assessment: Post-op Vital signs reviewed, Patient's Cardiovascular Status Stable, Respiratory Function Stable, Patent Airway and No signs of Nausea or vomiting  Last Vitals:  Filed Vitals:   09/12/13 1730  BP: 120/49  Pulse: 67  Temp:   Resp: 13    Post-op Vital Signs: stable   Complications: No apparent anesthesia complications

## 2013-09-13 ENCOUNTER — Encounter (HOSPITAL_COMMUNITY): Payer: Self-pay | Admitting: Orthopedic Surgery

## 2013-09-13 LAB — GLUCOSE, CAPILLARY
Glucose-Capillary: 112 mg/dL — ABNORMAL HIGH (ref 70–99)
Glucose-Capillary: 115 mg/dL — ABNORMAL HIGH (ref 70–99)

## 2013-09-13 MED ORDER — AMOXICILLIN-POT CLAVULANATE 500-125 MG PO TABS: 1.0000 | ORAL_TABLET | Freq: Two times a day (BID) | ORAL | Status: DC

## 2013-09-13 MED ORDER — CEPHALEXIN 250 MG PO CAPS: 250.0000 mg | ORAL_CAPSULE | Freq: Three times a day (TID) | ORAL | Status: DC

## 2013-09-13 MED ORDER — ONDANSETRON 4 MG PO TBDP: 4.0000 mg | ORAL_TABLET | Freq: Three times a day (TID) | ORAL | Status: DC | PRN

## 2013-09-13 MED ORDER — OXYCODONE HCL 5 MG PO TABS: 5.0000 mg | ORAL_TABLET | ORAL | Status: DC | PRN

## 2013-09-13 MED ORDER — GLYCERIN (LAXATIVE) 2 G RE SUPP: 1.0000 | Freq: Once | RECTAL | Status: DC

## 2013-09-13 NOTE — Op Note (Signed)
[name], [name] NO.:  [id number]  MEDICAL RECORD NO.:  [id number]  LOCATION:  1601                         FACILITY:  [facility]  PHYSICIAN:  Ollen Gross, M.D.    DATE OF BIRTH:  [birth year]  DATE OF PROCEDURE:  09/12/2013 DATE OF DISCHARGE:                              OPERATIVE REPORT   PREOPERATIVE DIAGNOSIS:  Soft tissue infection, right lateral thigh.  POSTOPERATIVE DIAGNOSIS:  Soft tissue infection, right lateral thigh.  PROCEDURE:  Irrigation and debridement of right thigh.  SURGEON:  Ollen Gross, M.D.  ASSISTANT:  Alexzandrew L. Perkins, P.A.C.  ANESTHESIA:  General.  ESTIMATED BLOOD LOSS:  Minimal.  DRAINS:  Blake drain x1, sewn into the skin.  COMPLICATIONS:  None.  CONDITION:  Stable to recovery.  BRIEF CLINICAL NOTE:  [name] is a 67 year old female, had right total hip arthroplasty 3 months ago.  She has done very well with the hip, but had fat necrosis and superficial wound breakdown.  She had an irrigation and debridement several weeks ago, did well and unfortunately presented to the office 3 days ago with a site of wound breakdown.  Dr. Ranell Patrick treated her with irrigation and debridement and packed the wound. She was returning to the operating room today for repeat irrigation and debridement, and closure over drain.  PROCEDURE IN DETAIL:  After successful administration of general anesthetic, the patient was placed in the left lateral decubitus position with the right side up and held with the hip positioner.  The right thigh was then isolated with plastic drapes, and prepped and draped in the usual sterile fashion.  The open area was then debrided of superficial fat on the edges.  The wound was then subsequently and copiously irrigated with 3 liters of saline with pulsatile lavage.  All the fatty tissue had a healthy appearance with no evidence of any necrotic fat remaining.  Skin edges were then debrided.  I placed  a Hemovac, Blake drain deep into the wound bed and then sewed the fat together in multiple layers.  This was approximately 8-10-inch thick layer of fat between the skin and the fascia.  Fortunately, this opening did not go all the way down to the fascia.  The fat was closed in multiple layers and subcu was closed with interrupted 2-0 Vicryl.  There was no significant tension on the wound edges.  The skin was closed with interrupted 4-0 nylon.  The drain was then sewn in with a 3-0 nylon.  Drain was hooked to suction.  The incision was then cleaned and dried, and a bulky sterile dressing applied.  She was then awakened and transported to recovery in stable condition.     Ollen Gross, M.D.     FA/MEDQ  D:  09/12/2013  T:  09/13/2013  Job:  [id number]

## 2013-09-13 NOTE — Progress Notes (Signed)
Discharge summary sent to payer through [facility]  

## 2013-09-13 NOTE — Discharge Summary (Signed)
Physician Discharge Summary   Patient ID: Kelly [name] MRN: [id number] DOB/AGE: 06/11/1946 67 y.o.  Admit date: 09/09/2013 Discharge date: 09/13/2013  Primary Diagnosis:  Right hip infection.  Admission Diagnoses:  Past Medical History  Diagnosis Date  . Diabetes mellitus without complication   . Arthritis   . Hypertension   . Heart murmur    Discharge Diagnoses:   Active Problems:   * No active hospital problems. *  Estimated body mass index is 47.83 kg/(m^2) as calculated from the following:   Height as of this encounter: 5\' 1"  (1.549 m).   Weight as of this encounter: 114.76 kg (253 lb).  Procedure(s) (LRB): IRRIGATION AND DEBRIDEMENT RIGHT  LATERAL THIGH (Right)   Consults: None  HPI: The patient is a 67 year old female with a history of  right total hip replacement by Dr. Ollen Gross back in July. The  patient has had postoperative course complicated by postoperative  infection. The patient has had an I and D, was reported to be  superficial, performed by Dr. Lequita Halt. The patient represents with  purulent drainage coming from her right hip for several days. The  patient was just seen earlier this week by Dr. Lequita Halt. The patient now  presents for operative I and D of her hip. She has not been febrile at  home or any significant increase in pain until last day or 2. Physical  exam today shows an obviously purulent open wound dehiscing on her right  hip. After counseling the patient regarding the need to perform the I  and D, the patient consented and informed consent obtained.  Laboratory Data: Admission on 09/09/2013, Discharged on 09/13/2013  Component Date Value Range Status  . aPTT 09/09/2013 35  24 - 37 seconds Final  . WBC 09/09/2013 8.5  4.0 - 10.5 K/uL Final  . RBC 09/09/2013 4.01  3.87 - 5.11 MIL/uL Final  . Hemoglobin 09/09/2013 11.0* 12.0 - 15.0 g/dL Final  . HCT 41/32/4401 34.1* 36.0 - 46.0 % Final  . MCV 09/09/2013 85.0  78.0 - 100.0 fL Final    . MCH 09/09/2013 27.4  26.0 - 34.0 pg Final  . MCHC 09/09/2013 32.3  30.0 - 36.0 g/dL Final  . RDW 02/72/5366 15.2  11.5 - 15.5 % Final  . Platelets 09/09/2013 384  150 - 400 K/uL Final  . Neutrophils Relative % 09/09/2013 68  43 - 77 % Final  . Neutro Abs 09/09/2013 5.8  1.7 - 7.7 K/uL Final  . Lymphocytes Relative 09/09/2013 21  12 - 46 % Final  . Lymphs Abs 09/09/2013 1.8  0.7 - 4.0 K/uL Final  . Monocytes Relative 09/09/2013 7  3 - 12 % Final  . Monocytes Absolute 09/09/2013 0.6  0.1 - 1.0 K/uL Final  . Eosinophils Relative 09/09/2013 3  0 - 5 % Final  . Eosinophils Absolute 09/09/2013 0.3  0.0 - 0.7 K/uL Final  . Basophils Relative 09/09/2013 0  0 - 1 % Final  . Basophils Absolute 09/09/2013 0.0  0.0 - 0.1 K/uL Final  . Sodium 09/09/2013 141  135 - 145 mEq/L Final  . Potassium 09/09/2013 4.3  3.5 - 5.1 mEq/L Final  . Chloride 09/09/2013 104  96 - 112 mEq/L Final  . CO2 09/09/2013 26  19 - 32 mEq/L Final  . Glucose, Bld 09/09/2013 151* 70 - 99 mg/dL Final  . BUN 44/02/4741 12  6 - 23 mg/dL Final  . Creatinine, Ser 09/09/2013 0.51  0.50 - 1.10 mg/dL Final  .  Calcium 09/09/2013 9.7  8.4 - 10.5 mg/dL Final  . Total Protein 09/09/2013 7.4  6.0 - 8.3 g/dL Final  . Albumin 16/09/9603 3.5  3.5 - 5.2 g/dL Final  . AST 54/08/8118 17  0 - 37 U/L Final  . ALT 09/09/2013 11  0 - 35 U/L Final  . Alkaline Phosphatase 09/09/2013 161* 39 - 117 U/L Final  . Total Bilirubin 09/09/2013 0.5  0.3 - 1.2 mg/dL Final  . GFR calc non Af Amer 09/09/2013 >90  >90 mL/min Final  . GFR calc Af Amer 09/09/2013 >90  >90 mL/min Final   Comment: (NOTE)                          The eGFR has been calculated using the CKD EPI equation.                          This calculation has not been validated in all clinical situations.                          eGFR's persistently <90 mL/min signify possible Chronic Kidney                          Disease.  Marland Kitchen Prothrombin Time 09/09/2013 13.4  11.6 - 15.2 seconds Final  .  INR 09/09/2013 1.04  0.00 - 1.49 Final  . Specimen Description 09/09/2013 URINE, CLEAN CATCH   Final  . Special Requests 09/09/2013 NONE   Final  . Culture  Setup Time 09/09/2013    Final                   Value:09/09/2013 17:39                         Performed at [facility]  . Colony Count 09/09/2013    Final                   Value:NO GROWTH                         Performed at [facility]  . Culture 09/09/2013    Final                   Value:NO GROWTH                         Performed at [facility]  . Report Status 09/09/2013 09/10/2013 FINAL   Final  . MRSA, PCR 09/09/2013 NEGATIVE  NEGATIVE Final  . Staphylococcus aureus 09/09/2013 NEGATIVE  NEGATIVE Final   Comment:                                 The Xpert SA Assay (FDA                          approved for NASAL specimens                          in patients over 23 years of age),  is one component of                          a comprehensive surveillance                          program.  Test performance has                          been validated by [facility] for patients greater                          than or equal to 68 year old.                          It is not intended                          to diagnose infection nor to                          guide or monitor treatment.                          Performed at [facility]  . Glucose-Capillary 09/09/2013 113* 70 - 99 mg/dL Final  . Specimen Description 09/10/2013 HIP RIGHT DEEP   Final  . Special Requests 09/10/2013 PATIENT ON FOLLOWING VANCOMYCIN, AUGMENTIN AND ANCEF   Final  . Gram Stain 09/10/2013    Final                   Value:MODERATE WBC PRESENT,BOTH PMN AND MONONUCLEAR                         NO ORGANISMS SEEN                         NO SQUAMOUS EPITHELIAL CELLS SEEN                         Performed at [facility]  . Culture 09/10/2013    Final                    Value:NO ANAEROBES ISOLATED                         Performed at [facility]  . Report Status 09/10/2013 09/14/2013 FINAL   Final  . Specimen Description 09/10/2013 HIP RIGHT DEEP   Final  . Special Requests 09/10/2013 PATIENT ON FOLLOWING VANCOMYCIN, AUGMENTIN AND ANCEF   Final  . Gram Stain 09/10/2013    Final                   Value:MODERATE WBC PRESENT,BOTH PMN AND MONONUCLEAR                         NO ORGANISMS SEEN  NO SQUAMOUS EPITHELIAL CELLS SEEN                         Performed at [facility]  . Culture 09/10/2013    Final                   Value:NO GROWTH 2 DAYS                         Performed at [facility]  . Report Status 09/10/2013 09/12/2013 FINAL   Final  . Specimen Description 09/10/2013 HIP RIGHT   Final  . Special Requests 09/10/2013 PATIENT ON FOLLOWING VANCOMYCIN, AUGMENTIN AND ANCEF   Final  . Gram Stain 09/10/2013    Final                   Value:NO WBC SEEN                         NO ORGANISMS SEEN                         NO SQUAMOUS EPITHELIAL CELLS SEEN                         Performed at [facility]  . Culture 09/10/2013    Final                   Value:NO ANAEROBES ISOLATED                         Performed at [facility]  . Report Status 09/10/2013 09/14/2013 FINAL   Final  . Specimen Description 09/10/2013 HIP RIGHT   Final  . Special Requests 09/10/2013 PATIENT ON FOLLOWING VANCOMYCIN, AUGMENTIN AND ANCEF   Final  . Gram Stain 09/10/2013    Final                   Value:NO WBC SEEN                         NO ORGANISMS SEEN                         NO SQUAMOUS EPITHELIAL CELLS SEEN                         Performed at [facility]  . Culture 09/10/2013    Final                   Value:NO GROWTH 2 DAYS                         Performed at [facility]  . Report Status 09/10/2013 09/12/2013 FINAL   Final  . Glucose-Capillary 09/10/2013 144* 70 - 99 mg/dL  Final  . Sodium 16/09/9603 139  135 - 145 mEq/L Final  . Potassium 09/10/2013 4.1  3.5 - 5.1 mEq/L Final  . Chloride 09/10/2013 105  96 - 112 mEq/L Final  . CO2 09/10/2013 26  19 - 32 mEq/L Final  . Glucose, Bld 09/10/2013 161* 70 - 99 mg/dL Final  . BUN 54/08/8118 10  6 - 23 mg/dL Final  . Creatinine,  Ser 09/10/2013 0.42* 0.50 - 1.10 mg/dL Final  . Calcium 16/09/9603 8.6  8.4 - 10.5 mg/dL Final  . GFR calc non Af Amer 09/10/2013 >90  >90 mL/min Final  . GFR calc Af Amer 09/10/2013 >90  >90 mL/min Final   Comment: (NOTE)                          The eGFR has been calculated using the CKD EPI equation.                          This calculation has not been validated in all clinical situations.                          eGFR's persistently <90 mL/min signify possible Chronic Kidney                          Disease.  Marland Kitchen Hemoglobin 09/10/2013 9.7* 12.0 - 15.0 g/dL Final  . HCT 54/08/8118 30.5* 36.0 - 46.0 % Final  . Glucose-Capillary 09/10/2013 146* 70 - 99 mg/dL Final  . Comment 1 14/78/2956 Documented in Chart   Final  . Comment 2 09/10/2013 Notify RN   Final  . Glucose-Capillary 09/10/2013 198* 70 - 99 mg/dL Final  . Glucose-Capillary 09/10/2013 158* 70 - 99 mg/dL Final  . Comment 1 21/30/8657 Documented in Chart   Final  . Comment 2 09/10/2013 Notify RN   Final  . Sodium 09/11/2013 136  135 - 145 mEq/L Final  . Potassium 09/11/2013 4.3  3.5 - 5.1 mEq/L Final  . Chloride 09/11/2013 102  96 - 112 mEq/L Final  . CO2 09/11/2013 23  19 - 32 mEq/L Final  . Glucose, Bld 09/11/2013 142* 70 - 99 mg/dL Final  . BUN 84/69/6295 13  6 - 23 mg/dL Final  . Creatinine, Ser 09/11/2013 0.69  0.50 - 1.10 mg/dL Final   DELTA CHECK NOTED  . Calcium 09/11/2013 8.9  8.4 - 10.5 mg/dL Final  . GFR calc non Af Amer 09/11/2013 89* >90 mL/min Final  . GFR calc Af Amer 09/11/2013 >90  >90 mL/min Final   Comment: (NOTE)                          The eGFR has been calculated using the CKD EPI equation.                           This calculation has not been validated in all clinical situations.                          eGFR's persistently <90 mL/min signify possible Chronic Kidney                          Disease.  . Glucose-Capillary 09/10/2013 185* 70 - 99 mg/dL Final  . Comment 1 28/41/3244 Documented in Chart   Final  . Comment 2 09/10/2013 Notify RN   Final  . Glucose-Capillary 09/11/2013 144* 70 - 99 mg/dL Final  . Glucose-Capillary 09/11/2013 121* 70 - 99 mg/dL Final  . Sodium 12/10/7251 138  135 - 145 mEq/L Final  . Potassium 09/12/2013 4.6  3.5 - 5.1 mEq/L Final  . Chloride 09/12/2013 105  96 - 112 mEq/L Final  . CO2 09/12/2013 20  19 - 32 mEq/L Final  . Glucose, Bld 09/12/2013 153* 70 - 99 mg/dL Final  . BUN 16/09/9603 13  6 - 23 mg/dL Final  . Creatinine, Ser 09/12/2013 0.64  0.50 - 1.10 mg/dL Final  . Calcium 54/08/8118 9.2  8.4 - 10.5 mg/dL Final  . GFR calc non Af Amer 09/12/2013 >90  >90 mL/min Final  . GFR calc Af Amer 09/12/2013 >90  >90 mL/min Final   Comment: (NOTE)                          The eGFR has been calculated using the CKD EPI equation.                          This calculation has not been validated in all clinical situations.                          eGFR's persistently <90 mL/min signify possible Chronic Kidney                          Disease.  . Glucose-Capillary 09/11/2013 139* 70 - 99 mg/dL Final  . Comment 1 14/78/2956 Documented in Chart   Final  . Comment 2 09/11/2013 Notify RN   Final  . Glucose-Capillary 09/12/2013 111* 70 - 99 mg/dL Final  . Glucose-Capillary 09/12/2013 137* 70 - 99 mg/dL Final  . Comment 1 21/30/8657 Notify RN   Final  . Glucose-Capillary 09/12/2013 96  70 - 99 mg/dL Final  . Comment 1 84/69/6295 Documented in Chart   Final  . Comment 2 09/12/2013 Notify RN   Final  . Glucose-Capillary 09/12/2013 92  70 - 99 mg/dL Final  . Glucose-Capillary 09/12/2013 169* 70 - 99 mg/dL Final  . Glucose-Capillary 09/13/2013 112* 70 - 99 mg/dL Final    . Glucose-Capillary 09/13/2013 115* 70 - 99 mg/dL Final  Admission on 28/41/3244, Discharged on 08/16/2013  Component Date Value Range Status  . Glucose-Capillary 08/15/2013 117* 70 - 99 mg/dL Final  . Glucose-Capillary 08/15/2013 99  70 - 99 mg/dL Final  . Comment 1 12/10/7251 Documented in Chart   Final  . Comment 2 08/15/2013 Notify RN   Final  . Specimen Description 08/15/2013 WOUND SEROMA   Final  . Special Requests 08/15/2013 RIGHT HIP   Final  . Gram Stain 08/15/2013    Final                   Value:NO WBC SEEN                         NO SQUAMOUS EPITHELIAL CELLS SEEN                         NO ORGANISMS SEEN                         Performed at [facility]  . Culture 08/15/2013    Final                   Value:RARE PROTEUS MIRABILIS                         Performed at  [facility]  . Report Status 08/15/2013 08/18/2013 FINAL   Final  . Organism ID, Bacteria 08/15/2013 PROTEUS MIRABILIS   Final  . Specimen Description 08/15/2013 WOUND SEROMA   Final  . Special Requests 08/15/2013 RIGHT HIP   Final  . Gram Stain 08/15/2013    Final                   Value:NO WBC SEEN                         NO SQUAMOUS EPITHELIAL CELLS SEEN                         NO ORGANISMS SEEN                         Performed at [facility]  . Culture 08/15/2013    Final                   Value:NO ANAEROBES ISOLATED                         Performed at [facility]  . Report Status 08/15/2013 08/20/2013 FINAL   Final  . Glucose-Capillary 08/15/2013 135* 70 - 99 mg/dL Final  . Glucose-Capillary 08/16/2013 136* 70 - 99 mg/dL Final  . Comment 1 21/30/8657 Notify RN   Final  . Comment 2 08/16/2013 Documented in Chart   Final  . Glucose-Capillary 08/16/2013 149* 70 - 99 mg/dL Final  . Comment 1 84/69/6295 Notify RN   Final  Hospital Outpatient Visit on 08/12/2013  Component Date Value Range Status  . WBC 08/12/2013 11.2* 4.0 - 10.5 K/uL Final  . RBC 08/12/2013 3.91   3.87 - 5.11 MIL/uL Final  . Hemoglobin 08/12/2013 11.0* 12.0 - 15.0 g/dL Final  . HCT 28/41/3244 34.6* 36.0 - 46.0 % Final  . MCV 08/12/2013 88.5  78.0 - 100.0 fL Final  . MCH 08/12/2013 28.1  26.0 - 34.0 pg Final  . MCHC 08/12/2013 31.8  30.0 - 36.0 g/dL Final  . RDW 12/10/7251 14.5  11.5 - 15.5 % Final  . Platelets 08/12/2013 413* 150 - 400 K/uL Final  . Sodium 08/12/2013 138  135 - 145 mEq/L Final  . Potassium 08/12/2013 4.6  3.5 - 5.1 mEq/L Final  . Chloride 08/12/2013 102  96 - 112 mEq/L Final  . CO2 08/12/2013 26  19 - 32 mEq/L Final  . Glucose, Bld 08/12/2013 193* 70 - 99 mg/dL Final  . BUN 66/44/0347 10  6 - 23 mg/dL Final  . Creatinine, Ser 08/12/2013 0.49* 0.50 - 1.10 mg/dL Final  . Calcium 42/59/5638 9.6  8.4 - 10.5 mg/dL Final  . GFR calc non Af Amer 08/12/2013 >90  >90 mL/min Final  . GFR calc Af Amer 08/12/2013 >90  >90 mL/min Final   Comment: (NOTE)                          The eGFR has been calculated using the CKD EPI equation.                          This calculation has not been validated in all clinical situations.  eGFR's persistently <90 mL/min signify possible Chronic Kidney                          Disease.     X-Rays:Dg Pelvis Portable  09/10/2013   CLINICAL DATA:  Status post incision and drainage  EXAM: PORTABLE PELVIS  COMPARISON:  07/07/2013  FINDINGS: Total right hip arthroplasty. No evidence of periprosthetic fracture. The prosthesis is located. Apparent generalized lucency of the puboacetabular junction and greater trochanter may be related to underpenetration or demineralization. No evident periosteal reaction. Unchanged osteopenia of the proximal right femur, chronic demineralization present preoperatively.  IMPRESSION: Lucency of the right greater trochanter and puboacetabular junction may be related to underpenetration or demineralization. In the setting of infection, repeat imaging is suggested - preferably departmental.    Electronically Signed   By: Tiburcio Pea M.D.   On: 09/10/2013 02:26    EKG:No orders found for this or any previous visit.   Hospital Course: Patient was admitted to [facility] and taken to the OR and underwent the above state procedure without complications.  Patient tolerated the procedure well and was later transferred to the recovery room and then to the orthopaedic floor for postoperative care.  They were given PO and IV analgesics for pain control following their surgery.  They were given postoperative antibiotics of  Anti-infectives   Start     Dose/Rate Route Frequency Ordered Stop   09/13/13 0000  amoxicillin-clavulanate (AUGMENTIN) 500-125 MG per tablet  Status:  Discontinued     1 tablet Oral 2 times daily 09/13/13 0952 09/13/13    09/13/13 0000  cephALEXin (KEFLEX) 250 MG capsule     250 mg Oral 3 times daily 09/13/13 1232     09/12/13 2200  ceFAZolin (ANCEF) IVPB 2 g/50 mL premix     2 g 100 mL/hr over 30 Minutes Intravenous Every 6 hours 09/12/13 1832 09/13/13 1004   09/10/13 1400  piperacillin-tazobactam (ZOSYN) IVPB 3.375 g  Status:  Discontinued     3.375 g 12.5 mL/hr over 240 Minutes Intravenous 3 times per day 09/10/13 1150 09/12/13 1832   09/10/13 0145  amoxicillin-clavulanate (AUGMENTIN) 500-125 MG per tablet 500 mg  Status:  Discontinued     1 tablet Oral 2 times daily 09/10/13 0131 09/10/13 1131   09/10/13 0145  vancomycin (VANCOCIN) IVPB 1000 mg/200 mL premix  Status:  Discontinued     1,000 mg 200 mL/hr over 60 Minutes Intravenous Every 12 hours 09/10/13 0131 09/10/13 0143   09/09/13 2300  vancomycin (VANCOCIN) IVPB 1000 mg/200 mL premix  Status:  Discontinued     1,000 mg 200 mL/hr over 60 Minutes Intravenous Every 12 hours 09/09/13 1244 09/12/13 1832   09/09/13 1500  vancomycin (VANCOCIN) 2,000 mg in sodium chloride 0.9 % 500 mL IVPB     2,000 mg 250 mL/hr over 120 Minutes Intravenous  Once 09/09/13 1244 09/09/13 1715   09/09/13 1400  ceFAZolin  (ANCEF) IVPB 2 g/50 mL premix  Status:  Discontinued     2 g 100 mL/hr over 30 Minutes Intravenous 3 times per day 09/09/13 1244 09/10/13 1131     and started on DVT prophylaxis in the form of Aspirin and Lovenox.   PT and OT were ordered for total hip protocol.  The patient was allowed to be WBAT with therapy. Discharge planning was consulted to help with postop disposition and equipment needs.  Patient had a decent night on the evening of surgery.  They started to get up OOB with therapy on day one and continued antibiotics.  Continued to work with therapy into day two. She was preoped again in anticipation of another I&D procedure as per Dr. Lequita Halt.  By day three, the patient was setup forrepeat irrigation and debridement and wound closure. She tolerated the second procedure well and continued on the IV ABX.  She was seen the following day and doing better.  Seen in rounds by Dr. Lequita Halt and it was felt that she would be able to go home later that day. She was sent home on oral ABX.  Discharge home with home health  Diet - Cardiac diet and Diabetic diet  Follow up - in 2 days  Activity - WBAT  Disposition - Home  Condition Upon Discharge - Stable  D/C Meds - See DC Summary  DVT Prophylaxis - Lovenox and Aspirin      Discharge Orders   Future Orders Complete By Expires   Call MD / Call 911  As directed    Comments:     If you experience chest pain or shortness of breath, CALL 911 and be transported to the hospital emergency room.  If you develope a fever above 101 F, pus (white drainage) or increased drainage or redness at the wound, or calf pain, call your surgeon's office.   Change dressing  As directed    Comments:     You may change your dressing dressing daily with sterile 4 x 4 inch gauze dressing and paper tape.  Do not submerge the incision under water.   Constipation Prevention  As directed    Comments:     Drink plenty of fluids.  Prune juice may be helpful.  You may use a  stool softener, such as Colace (over the counter) 100 mg twice a day.  Use MiraLax (over the counter) for constipation as needed.   Diet - low sodium heart healthy  As directed    Diet Carb Modified  As directed    Discharge instructions  As directed    Comments:     Pick up stool softner and laxative for home. Do not submerge incision under water. May shower. Continue to use ice for pain and swelling from surgery. Hip precautions.  Total Hip Protocol. Resume the Aspirin. Measure and record the drain output.   Do not sit on low chairs, stoools or toilet seats, as it may be difficult to get up from low surfaces  As directed    Driving restrictions  As directed    Comments:     No driving until released by the physician.   Follow the hip precautions as taught in Physical Therapy  As directed    Increase activity slowly as tolerated  As directed    Lifting restrictions  As directed    Comments:     No lifting until released by the physician.   Patient may shower  As directed    Comments:     You may shower without a dressing once there is no drainage.  Do not wash over the wound.  If drainage remains, do not shower until drainage stops.   TED hose  As directed    Comments:     Use stockings (TED hose) for 3 weeks on both leg(s).  You may remove them at night for sleeping.   Weight bearing as tolerated  As directed    Questions:     Laterality:     Extremity:  Medication List    STOP taking these medications       amoxicillin-clavulanate 500-125 MG per tablet  Commonly known as:  AUGMENTIN     Turmeric Curcumin 500 MG Caps      TAKE these medications       aspirin 81 MG tablet  Take 81 mg by mouth daily.     cephALEXin 250 MG capsule  Commonly known as:  KEFLEX  Take 1 capsule (250 mg total) by mouth 3 (three) times daily.     exenatide 10 MCG/0.04ML Sopn injection  Commonly known as:  BYETTA  Inject 10 mcg into the skin daily.     glycerin adult 2 G Supp    Commonly known as:  glycerin adult  Place 1 suppository rectally once.     ICAPS AREDS FORMULA PO  Take 1 tablet by mouth daily.     ondansetron 4 MG disintegrating tablet  Commonly known as:  ZOFRAN ODT  Take 1 tablet (4 mg total) by mouth every 8 (eight) hours as needed for nausea.     ondansetron 4 MG tablet  Commonly known as:  ZOFRAN  Take 4 mg by mouth every 8 (eight) hours as needed for nausea.     oxyCODONE 5 MG immediate release tablet  Commonly known as:  Oxy IR/ROXICODONE  Take 1-3 tablets (5-15 mg total) by mouth every 3 (three) hours as needed.     pioglitazone-metformin 15-850 MG per tablet  Commonly known as:  ACTOPLUS MET  Take 1 tablet by mouth every evening.     ramipril 10 MG capsule  Commonly known as:  ALTACE  Take 20 mg by mouth every evening.     simvastatin 40 MG tablet  Commonly known as:  ZOCOR  Take 40 mg by mouth every evening.       Follow-up Information   Follow up with Loanne Drilling, MD. Schedule an appointment as soon as possible for a visit in 2 days.   Specialty:  Orthopedic Surgery   Contact information:   [address] [place] [address] [phone]       Signed: Patrica Duel 09/23/2013, 9:19 AM

## 2013-09-13 NOTE — Progress Notes (Signed)
   Subjective: 1 Day Post-Op Procedure(s) (LRB): IRRIGATION AND DEBRIDEMENT RIGHT  LATERAL THIGH (Right) Patient reports pain as mild.   Patient seen in rounds for Dr. Lequita Halt. Family in room. Patient is well, and has had no acute complaints or problems Patient is ready to go home.  Objective: Vital signs in last 24 hours: Temp:  [97.6 F (36.4 C)-98 F (36.7 C)] 97.9 F (36.6 C) (10/07 0623) Pulse Rate:  [60-84] 84 (10/07 0623) Resp:  [11-20] 16 (10/07 0623) BP: (112-152)/(47-82) 135/82 mmHg (10/07 0623) SpO2:  [97 %-100 %] 100 % (10/07 0623)  Intake/Output from previous day:  Intake/Output Summary (Last 24 hours) at 09/13/13 0944 Last data filed at 09/13/13 0900  Gross per 24 hour  Intake   1490 ml  Output   1195 ml  Net    295 ml    Intake/Output this shift: Total I/O In: 240 [P.O.:240] Out: 300 [Urine:300]  Labs: No results found for this basename: HGB,  in the last 72 hours No results found for this basename: WBC, RBC, HCT, PLT,  in the last 72 hours  Recent Labs  09/11/13 0417 09/12/13 0410  NA 136 138  K 4.3 4.6  CL 102 105  CO2 23 20  BUN 13 13  CREATININE 0.69 0.64  GLUCOSE 142* 153*  CALCIUM 8.9 9.2   No results found for this basename: LABPT, INR,  in the last 72 hours  EXAM: General - Patient is Alert, Appropriate and Oriented Extremity - Neurovascular intact Sensation intact distally Dorsiflexion/Plantar flexion intact Dressing - clean, dry, no drainage, intact Motor Function - intact, moving foot and toes well on exam.   Assessment/Plan: 1 Day Post-Op Procedure(s) (LRB): IRRIGATION AND DEBRIDEMENT RIGHT  LATERAL THIGH (Right) Procedure(s) (LRB): IRRIGATION AND DEBRIDEMENT RIGHT  LATERAL THIGH (Right) Past Medical History  Diagnosis Date  . Diabetes mellitus without complication   . Arthritis   . Hypertension   . Heart murmur    Active Problems:   * No active hospital problems. *  Estimated body mass index is 47.83 kg/(m^2) as  calculated from the following:   Height as of this encounter: 5\' 1"  (1.549 m).   Weight as of this encounter: 114.76 kg (253 lb). Up with therapy Discharge home with home health Diet - Cardiac diet and Diabetic diet Follow up - in 2 days Activity - WBAT Disposition - Home Condition Upon Discharge - Stable D/C Meds - See DC Summary DVT Prophylaxis - Lovenox and Aspirin  Treylen Gibbs 09/13/2013, 9:44 AM

## 2013-09-14 LAB — ANAEROBIC CULTURE: Gram Stain: NONE SEEN

## 2014-06-14 ENCOUNTER — Other Ambulatory Visit: Payer: Self-pay

## 2014-06-14 DIAGNOSIS — Z1231 Encounter for screening mammogram for malignant neoplasm of breast: Secondary | ICD-10-CM

## 2014-07-07 ENCOUNTER — Ambulatory Visit
Admission: RE | Admit: 2014-07-07 | Discharge: 2014-07-07 | Disposition: A | Discharge status: Home / Self Care | Payer: BC Managed Care – PPO | Source: Ambulatory Visit

## 2014-07-07 ENCOUNTER — Encounter (INDEPENDENT_AMBULATORY_CARE_PROVIDER_SITE_OTHER): Payer: Self-pay

## 2014-07-07 DIAGNOSIS — Z1231 Encounter for screening mammogram for malignant neoplasm of breast: Secondary | ICD-10-CM

## 2015-01-16 ENCOUNTER — Emergency Department (HOSPITAL_COMMUNITY): Payer: Worker's Compensation | Admitting: Certified Registered Nurse Anesthetist

## 2015-01-16 ENCOUNTER — Emergency Department (HOSPITAL_COMMUNITY): Payer: Worker's Compensation

## 2015-01-16 ENCOUNTER — Encounter (HOSPITAL_COMMUNITY)
Admission: EM | Disposition: A | Discharge status: Home Health | Payer: Self-pay | Source: Home / Self Care | Attending: Orthopedic Surgery

## 2015-01-16 ENCOUNTER — Inpatient Hospital Stay (HOSPITAL_COMMUNITY)
Admission: EM | Admit: 2015-01-16 | Discharge: 2015-01-20 | DRG: 481 | Disposition: A | Discharge status: Home Health | Payer: Worker's Compensation | Attending: Orthopedic Surgery | Admitting: Orthopedic Surgery

## 2015-01-16 ENCOUNTER — Encounter (HOSPITAL_COMMUNITY): Payer: Self-pay

## 2015-01-16 ENCOUNTER — Ambulatory Visit: Admit: 2015-01-16 | Payer: Self-pay | Admitting: Orthopedic Surgery

## 2015-01-16 DIAGNOSIS — E119 Type 2 diabetes mellitus without complications: Secondary | ICD-10-CM | POA: Diagnosis present

## 2015-01-16 DIAGNOSIS — Z96641 Presence of right artificial hip joint: Secondary | ICD-10-CM | POA: Diagnosis present

## 2015-01-16 DIAGNOSIS — D509 Iron deficiency anemia, unspecified: Secondary | ICD-10-CM | POA: Diagnosis not present

## 2015-01-16 DIAGNOSIS — W0110XA Fall on same level from slipping, tripping and stumbling with subsequent striking against unspecified object, initial encounter: Secondary | ICD-10-CM | POA: Diagnosis present

## 2015-01-16 DIAGNOSIS — Y92219 Unspecified school as the place of occurrence of the external cause: Secondary | ICD-10-CM

## 2015-01-16 DIAGNOSIS — S0990XA Unspecified injury of head, initial encounter: Secondary | ICD-10-CM

## 2015-01-16 DIAGNOSIS — I10 Essential (primary) hypertension: Secondary | ICD-10-CM | POA: Diagnosis present

## 2015-01-16 DIAGNOSIS — W19XXXA Unspecified fall, initial encounter: Secondary | ICD-10-CM

## 2015-01-16 DIAGNOSIS — S72409A Unspecified fracture of lower end of unspecified femur, initial encounter for closed fracture: Secondary | ICD-10-CM | POA: Diagnosis present

## 2015-01-16 DIAGNOSIS — S72451A Displaced supracondylar fracture without intracondylar extension of lower end of right femur, initial encounter for closed fracture: Secondary | ICD-10-CM | POA: Diagnosis not present

## 2015-01-16 DIAGNOSIS — Z6841 Body Mass Index (BMI) 40.0 and over, adult: Secondary | ICD-10-CM | POA: Diagnosis not present

## 2015-01-16 DIAGNOSIS — S0003XA Contusion of scalp, initial encounter: Secondary | ICD-10-CM

## 2015-01-16 DIAGNOSIS — M25561 Pain in right knee: Secondary | ICD-10-CM | POA: Diagnosis present

## 2015-01-16 DIAGNOSIS — S72401A Unspecified fracture of lower end of right femur, initial encounter for closed fracture: Secondary | ICD-10-CM | POA: Diagnosis present

## 2015-01-16 DIAGNOSIS — S72009A Fracture of unspecified part of neck of unspecified femur, initial encounter for closed fracture: Secondary | ICD-10-CM

## 2015-01-16 HISTORY — PX: FEMUR IM NAIL: SHX1597

## 2015-01-16 LAB — PROTIME-INR
INR: 1.05 (ref 0.00–1.49)
Prothrombin Time: 13.8 seconds (ref 11.6–15.2)

## 2015-01-16 LAB — CBC WITH DIFFERENTIAL/PLATELET
BASOS PCT: 0 % (ref 0–1)
Basophils Absolute: 0 10*3/uL (ref 0.0–0.1)
Eosinophils Absolute: 0.1 10*3/uL (ref 0.0–0.7)
Eosinophils Relative: 0 % (ref 0–5)
HCT: 35.5 % — ABNORMAL LOW (ref 36.0–46.0)
Hemoglobin: 11.1 g/dL — ABNORMAL LOW (ref 12.0–15.0)
Lymphocytes Relative: 11 % — ABNORMAL LOW (ref 12–46)
Lymphs Abs: 1.7 10*3/uL (ref 0.7–4.0)
MCH: 28.2 pg (ref 26.0–34.0)
MCHC: 31.3 g/dL (ref 30.0–36.0)
MCV: 90.1 fL (ref 78.0–100.0)
MONOS PCT: 6 % (ref 3–12)
Monocytes Absolute: 1 10*3/uL (ref 0.1–1.0)
Neutro Abs: 12.9 10*3/uL — ABNORMAL HIGH (ref 1.7–7.7)
Neutrophils Relative %: 83 % — ABNORMAL HIGH (ref 43–77)
Platelets: 286 10*3/uL (ref 150–400)
RBC: 3.94 MIL/uL (ref 3.87–5.11)
RDW: 15.5 % (ref 11.5–15.5)
WBC: 15.7 10*3/uL — ABNORMAL HIGH (ref 4.0–10.5)

## 2015-01-16 LAB — BASIC METABOLIC PANEL
ANION GAP: 10 (ref 5–15)
BUN: 23 mg/dL (ref 6–23)
CHLORIDE: 105 mmol/L (ref 96–112)
CO2: 24 mmol/L (ref 19–32)
CREATININE: 0.62 mg/dL (ref 0.50–1.10)
Calcium: 9.2 mg/dL (ref 8.4–10.5)
GFR calc Af Amer: >90 mL/min (ref >90)
GFR calc non Af Amer: >90 mL/min (ref >90)
GLUCOSE: 149 mg/dL — AB (ref 70–99)
Potassium: 4.4 mmol/L (ref 3.5–5.1)
Sodium: 139 mmol/L (ref 135–145)

## 2015-01-16 LAB — GLUCOSE, CAPILLARY
Glucose-Capillary: 111 mg/dL — ABNORMAL HIGH (ref 70–99)
Glucose-Capillary: 187 mg/dL — ABNORMAL HIGH (ref 70–99)

## 2015-01-16 LAB — CBG MONITORING, ED: Glucose-Capillary: 145 mg/dL — ABNORMAL HIGH (ref 70–99)

## 2015-01-16 SURGERY — INSERTION, INTRAMEDULLARY ROD, FEMUR, RETROGRADE
Anesthesia: Spinal | Site: Leg Lower | Laterality: Right

## 2015-01-16 MED ORDER — BISACODYL 10 MG RE SUPP: 10.0000 mg | Freq: Every day | RECTAL | Status: DC | PRN

## 2015-01-16 MED ORDER — LACTATED RINGERS IV SOLN
INTRAVENOUS | Status: DC | PRN
  Administered 2015-01-16: 17:00:00 via INTRAVENOUS

## 2015-01-16 MED ORDER — FLEET ENEMA 7-19 GM/118ML RE ENEM: 1.0000 | ENEMA | Freq: Once | RECTAL | Status: AC | PRN

## 2015-01-16 MED ORDER — TRAMADOL HCL 50 MG PO TABS: 50.0000 mg | ORAL_TABLET | Freq: Four times a day (QID) | ORAL | Status: DC | PRN

## 2015-01-16 MED ORDER — CEFAZOLIN SODIUM-DEXTROSE 2-3 GM-% IV SOLR
2.0000 g | Freq: Four times a day (QID) | INTRAVENOUS | Status: AC
  Administered 2015-01-16 – 2015-01-17 (×3): 2 g via INTRAVENOUS
  Filled 2015-01-16 (×3): qty 50

## 2015-01-16 MED ORDER — MEPERIDINE HCL 50 MG/ML IJ SOLN: 6.2500 mg | INTRAMUSCULAR | Status: DC | PRN

## 2015-01-16 MED ORDER — KETAMINE HCL 10 MG/ML IJ SOLN
INTRAMUSCULAR | Status: DC | PRN
  Administered 2015-01-16: 50 mg via INTRAVENOUS

## 2015-01-16 MED ORDER — SIMVASTATIN 40 MG PO TABS
40.0000 mg | ORAL_TABLET | Freq: Every evening | ORAL | Status: DC
  Administered 2015-01-16 – 2015-01-19 (×4): 40 mg via ORAL
  Filled 2015-01-16 (×5): qty 1

## 2015-01-16 MED ORDER — EXENATIDE 10 MCG/0.04ML ~~LOC~~ SOPN
10.0000 ug | PEN_INJECTOR | Freq: Every day | SUBCUTANEOUS | Status: DC
  Administered 2015-01-17 – 2015-01-19 (×3): 10 ug via SUBCUTANEOUS

## 2015-01-16 MED ORDER — FENTANYL CITRATE 0.05 MG/ML IJ SOLN
INTRAMUSCULAR | Status: DC | PRN
  Administered 2015-01-16: 25 ug via INTRAVENOUS

## 2015-01-16 MED ORDER — MIDAZOLAM HCL 2 MG/2ML IJ SOLN
INTRAMUSCULAR | Status: AC
  Filled 2015-01-16: qty 2

## 2015-01-16 MED ORDER — ENOXAPARIN SODIUM 40 MG/0.4ML ~~LOC~~ SOLN
40.0000 mg | SUBCUTANEOUS | Status: DC
  Administered 2015-01-17 – 2015-01-20 (×4): 40 mg via SUBCUTANEOUS
  Filled 2015-01-16 (×6): qty 0.4

## 2015-01-16 MED ORDER — MORPHINE SULFATE 4 MG/ML IJ SOLN
6.0000 mg | Freq: Once | INTRAMUSCULAR | Status: AC
  Administered 2015-01-16: 6 mg via INTRAMUSCULAR
  Filled 2015-01-16: qty 2

## 2015-01-16 MED ORDER — DOCUSATE SODIUM 100 MG PO CAPS
100.0000 mg | ORAL_CAPSULE | Freq: Two times a day (BID) | ORAL | Status: DC
  Administered 2015-01-16 – 2015-01-20 (×8): 100 mg via ORAL

## 2015-01-16 MED ORDER — RAMIPRIL 10 MG PO CAPS
20.0000 mg | ORAL_CAPSULE | Freq: Every evening | ORAL | Status: DC
  Administered 2015-01-16: 20 mg via ORAL
  Filled 2015-01-16 (×2): qty 2

## 2015-01-16 MED ORDER — SODIUM CHLORIDE 0.9 % IV SOLN
Freq: Once | INTRAVENOUS | Status: AC
  Administered 2015-01-16: 15:00:00 via INTRAVENOUS

## 2015-01-16 MED ORDER — CEFAZOLIN SODIUM-DEXTROSE 2-3 GM-% IV SOLR
2.0000 g | Freq: Once | INTRAVENOUS | Status: AC
  Administered 2015-01-16: 2 g via INTRAVENOUS
  Filled 2015-01-16: qty 50

## 2015-01-16 MED ORDER — CYCLOBENZAPRINE HCL 5 MG PO TABS
5.0000 mg | ORAL_TABLET | Freq: Three times a day (TID) | ORAL | Status: DC | PRN
  Administered 2015-01-16 – 2015-01-20 (×11): 5 mg via ORAL
  Filled 2015-01-16 (×10): qty 1

## 2015-01-16 MED ORDER — PROMETHAZINE HCL 25 MG/ML IJ SOLN
6.2500 mg | INTRAMUSCULAR | Status: DC | PRN
Start: 2015-01-16 — End: 2015-01-16

## 2015-01-16 MED ORDER — PROPOFOL 10 MG/ML IV BOLUS
INTRAVENOUS | Status: AC
  Filled 2015-01-16: qty 20

## 2015-01-16 MED ORDER — DEXAMETHASONE SODIUM PHOSPHATE 10 MG/ML IJ SOLN
INTRAMUSCULAR | Status: AC
  Filled 2015-01-16: qty 1

## 2015-01-16 MED ORDER — KETAMINE HCL 10 MG/ML IJ SOLN
INTRAMUSCULAR | Status: AC
  Filled 2015-01-16: qty 1

## 2015-01-16 MED ORDER — 0.9 % SODIUM CHLORIDE (POUR BTL) OPTIME
TOPICAL | Status: DC | PRN
  Administered 2015-01-16: 1000 mL

## 2015-01-16 MED ORDER — INSULIN ASPART 100 UNIT/ML ~~LOC~~ SOLN
0.0000 [IU] | Freq: Three times a day (TID) | SUBCUTANEOUS | Status: DC
  Administered 2015-01-17 (×2): 2 [IU] via SUBCUTANEOUS
  Administered 2015-01-17: 3 [IU] via SUBCUTANEOUS
  Administered 2015-01-18: 2 [IU] via SUBCUTANEOUS
  Administered 2015-01-18: 3 [IU] via SUBCUTANEOUS
  Administered 2015-01-18: 2 [IU] via SUBCUTANEOUS
  Administered 2015-01-19: 3 [IU] via SUBCUTANEOUS

## 2015-01-16 MED ORDER — ONDANSETRON HCL 4 MG/2ML IJ SOLN
INTRAMUSCULAR | Status: DC | PRN
  Administered 2015-01-16: 4 mg via INTRAVENOUS

## 2015-01-16 MED ORDER — ONDANSETRON HCL 4 MG/2ML IJ SOLN
INTRAMUSCULAR | Status: AC
  Filled 2015-01-16: qty 2

## 2015-01-16 MED ORDER — PROPOFOL INFUSION 10 MG/ML OPTIME
INTRAVENOUS | Status: DC | PRN
  Administered 2015-01-16: 50 ug/kg/min via INTRAVENOUS

## 2015-01-16 MED ORDER — ONDANSETRON HCL 4 MG PO TABS: 4.0000 mg | ORAL_TABLET | Freq: Four times a day (QID) | ORAL | Status: DC | PRN

## 2015-01-16 MED ORDER — SODIUM CHLORIDE 0.9 % IV SOLN
10.0000 mg | INTRAVENOUS | Status: DC | PRN
  Administered 2015-01-16: 50 ug/min via INTRAVENOUS

## 2015-01-16 MED ORDER — METFORMIN HCL 850 MG PO TABS
850.0000 mg | ORAL_TABLET | Freq: Every day | ORAL | Status: DC
  Filled 2015-01-16: qty 1

## 2015-01-16 MED ORDER — MIDAZOLAM HCL 5 MG/5ML IJ SOLN
INTRAMUSCULAR | Status: DC | PRN
  Administered 2015-01-16 (×2): 1 mg via INTRAVENOUS

## 2015-01-16 MED ORDER — MORPHINE SULFATE 4 MG/ML IJ SOLN
4.0000 mg | INTRAMUSCULAR | Status: DC | PRN
  Administered 2015-01-16: 4 mg via INTRAVENOUS
  Filled 2015-01-16: qty 1

## 2015-01-16 MED ORDER — BUPIVACAINE IN DEXTROSE 0.75-8.25 % IT SOLN
INTRATHECAL | Status: DC | PRN
  Administered 2015-01-16: 1.6 mL via INTRATHECAL

## 2015-01-16 MED ORDER — ACETAMINOPHEN 10 MG/ML IV SOLN
1000.0000 mg | Freq: Once | INTRAVENOUS | Status: AC
  Administered 2015-01-16: 1000 mg via INTRAVENOUS
  Filled 2015-01-16: qty 100

## 2015-01-16 MED ORDER — POLYETHYLENE GLYCOL 3350 17 G PO PACK: 17.0000 g | PACK | Freq: Every day | ORAL | Status: DC | PRN

## 2015-01-16 MED ORDER — METOCLOPRAMIDE HCL 5 MG/ML IJ SOLN: 5.0000 mg | Freq: Three times a day (TID) | INTRAMUSCULAR | Status: DC | PRN

## 2015-01-16 MED ORDER — FENTANYL CITRATE 0.05 MG/ML IJ SOLN
INTRAMUSCULAR | Status: AC
  Filled 2015-01-16: qty 2

## 2015-01-16 MED ORDER — PIOGLITAZONE HCL 15 MG PO TABS
15.0000 mg | ORAL_TABLET | Freq: Every day | ORAL | Status: DC
  Administered 2015-01-17 – 2015-01-19 (×3): 15 mg via ORAL
  Filled 2015-01-16 (×4): qty 1

## 2015-01-16 MED ORDER — ONDANSETRON HCL 4 MG/2ML IJ SOLN: 4.0000 mg | Freq: Four times a day (QID) | INTRAMUSCULAR | Status: DC | PRN

## 2015-01-16 MED ORDER — SODIUM CHLORIDE 0.9 % IJ SOLN
INTRAMUSCULAR | Status: AC
  Filled 2015-01-16: qty 10

## 2015-01-16 MED ORDER — PROPOFOL 10 MG/ML IV BOLUS
INTRAVENOUS | Status: DC | PRN
  Administered 2015-01-16: 20 mg via INTRAVENOUS

## 2015-01-16 MED ORDER — METOCLOPRAMIDE HCL 10 MG PO TABS: 5.0000 mg | ORAL_TABLET | Freq: Three times a day (TID) | ORAL | Status: DC | PRN

## 2015-01-16 MED ORDER — LIDOCAINE HCL (CARDIAC) 20 MG/ML IV SOLN
INTRAVENOUS | Status: AC
Start: 2015-01-16 — End: 2015-01-16
  Filled 2015-01-16: qty 5

## 2015-01-16 MED ORDER — FENTANYL CITRATE 0.05 MG/ML IJ SOLN: 25.0000 ug | INTRAMUSCULAR | Status: DC | PRN

## 2015-01-16 MED ORDER — CEFAZOLIN SODIUM 1-5 GM-% IV SOLN: 1.0000 g | Freq: Once | INTRAVENOUS | Status: DC

## 2015-01-16 MED ORDER — PIOGLITAZONE HCL-METFORMIN HCL 15-850 MG PO TABS: 1.0000 | ORAL_TABLET | Freq: Every evening | ORAL | Status: DC

## 2015-01-16 MED ORDER — MORPHINE SULFATE 2 MG/ML IJ SOLN
1.0000 mg | INTRAMUSCULAR | Status: DC | PRN
  Administered 2015-01-16 (×2): 1 mg via INTRAVENOUS
  Filled 2015-01-16 (×2): qty 1

## 2015-01-16 MED ORDER — CEFAZOLIN SODIUM-DEXTROSE 2-3 GM-% IV SOLR
INTRAVENOUS | Status: AC
  Filled 2015-01-16: qty 50

## 2015-01-16 MED ORDER — SODIUM CHLORIDE 0.9 % IV SOLN
INTRAVENOUS | Status: DC
  Administered 2015-01-16 – 2015-01-18 (×3): via INTRAVENOUS

## 2015-01-16 MED ORDER — OXYCODONE HCL 5 MG PO TABS
5.0000 mg | ORAL_TABLET | ORAL | Status: DC | PRN
  Administered 2015-01-16: 5 mg via ORAL
  Administered 2015-01-17 (×4): 10 mg via ORAL
  Filled 2015-01-16: qty 2
  Filled 2015-01-16: qty 1
  Filled 2015-01-16 (×3): qty 2

## 2015-01-16 MED ORDER — EPHEDRINE SULFATE 50 MG/ML IJ SOLN
INTRAMUSCULAR | Status: AC
  Filled 2015-01-16: qty 1

## 2015-01-16 SURGICAL SUPPLY — 62 items
BAG SPEC THK2 15X12 ZIP CLS (MISCELLANEOUS)
BAG ZIPLOCK 12X15 (MISCELLANEOUS) IMPLANT
BANDAGE ELASTIC 6 VELCRO ST LF (GAUZE/BANDAGES/DRESSINGS) ×4 IMPLANT
BANDAGE ESMARK 6X9 LF (GAUZE/BANDAGES/DRESSINGS) IMPLANT
BNDG CMPR 9X6 STRL LF SNTH (GAUZE/BANDAGES/DRESSINGS)
BNDG ESMARK 6X9 LF (GAUZE/BANDAGES/DRESSINGS)
DRAPE C-ARM 42X120 X-RAY (DRAPES) ×4 IMPLANT
DRAPE C-ARMOR (DRAPES) ×2 IMPLANT
DRAPE ORTHO SPLIT 77X108 STRL (DRAPES) ×4
DRAPE POUCH INSTRU U-SHP 10X18 (DRAPES) ×2 IMPLANT
DRAPE SHEET LG 3/4 BI-LAMINATE (DRAPES) IMPLANT
DRAPE SURG ORHT 6 SPLT 77X108 (DRAPES) ×2 IMPLANT
DRAPE U-SHAPE 47X51 STRL (DRAPES) ×4 IMPLANT
DRILL TWIST 3.8 QC CALB DISP (TRAUMA) ×2 IMPLANT
DRILL TWIST 4.4 CALB (TRAUMA) ×2 IMPLANT
DRSG ADAPTIC 3X8 NADH LF (GAUZE/BANDAGES/DRESSINGS) ×2 IMPLANT
DRSG EMULSION OIL 3X16 NADH (GAUZE/BANDAGES/DRESSINGS) ×2 IMPLANT
DRSG MEPILEX BORDER 4X4 (GAUZE/BANDAGES/DRESSINGS) IMPLANT
DRSG MEPILEX BORDER 4X8 (GAUZE/BANDAGES/DRESSINGS) IMPLANT
DRSG PAD ABDOMINAL 8X10 ST (GAUZE/BANDAGES/DRESSINGS) ×4 IMPLANT
DURAPREP 26ML APPLICATOR (WOUND CARE) ×2 IMPLANT
EVACUATOR 1/8 PVC DRAIN (DRAIN) ×2 IMPLANT
GAUZE SPONGE 4X4 12PLY STRL (GAUZE/BANDAGES/DRESSINGS) ×2 IMPLANT
GLOVE BIO SURGEON STRL SZ7.5 (GLOVE) ×2 IMPLANT
GLOVE BIO SURGEON STRL SZ8 (GLOVE) ×2 IMPLANT
GLOVE BIOGEL PI IND STRL 7.5 (GLOVE) ×1 IMPLANT
GLOVE BIOGEL PI IND STRL 8 (GLOVE) ×2 IMPLANT
GLOVE BIOGEL PI INDICATOR 7.5 (GLOVE) ×1
GLOVE BIOGEL PI INDICATOR 8 (GLOVE) ×2
GLOVE SURG SS PI 7.5 STRL IVOR (GLOVE) ×2 IMPLANT
GOWN STRL REUS W/TWL LRG LVL3 (GOWN DISPOSABLE) ×2 IMPLANT
GOWN STRL REUS W/TWL XL LVL3 (GOWN DISPOSABLE) ×4 IMPLANT
GUIDEPIN 3.2X17.5 THRD DISP (PIN) ×2 IMPLANT
GUIDEWIRE BALL NOSE 80CM (WIRE) ×2 IMPLANT
GUIDEWIRE HUMERAL 2.2MMX711MM (WIRE) ×2 IMPLANT
IMMOBILIZER KNEE 20 (SOFTGOODS)
IMMOBILIZER KNEE 20 THIGH 36 (SOFTGOODS) IMPLANT
KIT BASIN OR (CUSTOM PROCEDURE TRAY) ×2 IMPLANT
MANIFOLD NEPTUNE II (INSTRUMENTS) ×2 IMPLANT
NAIL VERSA STR 12MMX15CM (Nail) ×2 IMPLANT
NS IRRIG 1000ML POUR BTL (IV SOLUTION) ×2 IMPLANT
PACK TOTAL JOINT (CUSTOM PROCEDURE TRAY) ×2 IMPLANT
PADDING CAST COTTON 6X4 STRL (CAST SUPPLIES) ×6 IMPLANT
POSITIONER SURGICAL ARM (MISCELLANEOUS) ×2 IMPLANT
SCREW ACECAP 40MM (Screw) ×2 IMPLANT
SCREW ACECAP 42MM (Screw) ×2 IMPLANT
SCREW PROXIMAL DEPUY (Screw) ×8 IMPLANT
SCREW PRXML FT 45X5.5XLCK NS (Screw) ×1 IMPLANT
SCREW PRXML FT 50X5.5XLCK NS (Screw) ×2 IMPLANT
SCREW PRXML FT 60X5.5XNS LF (Screw) ×1 IMPLANT
STAPLER VISISTAT 35W (STAPLE) ×2 IMPLANT
STRIP CLOSURE SKIN 1/2X4 (GAUZE/BANDAGES/DRESSINGS) IMPLANT
SUT MNCRL AB 4-0 PS2 18 (SUTURE) ×2 IMPLANT
SUT VIC AB 0 CT1 27 (SUTURE) ×1
SUT VIC AB 0 CT1 27XBRD ANTBC (SUTURE) ×1 IMPLANT
SUT VIC AB 2-0 CT1 27 (SUTURE) ×8
SUT VIC AB 2-0 CT1 TAPERPNT 27 (SUTURE) ×4 IMPLANT
SUT VLOC 180 0 24IN GS25 (SUTURE) ×2 IMPLANT
TOWEL OR 17X26 10 PK STRL BLUE (TOWEL DISPOSABLE) ×4 IMPLANT
TRAY FOLEY CATH 14FRSI W/METER (CATHETERS) IMPLANT
TUBE EXCHANGE NAIL HUMERAL (TRAUMA) ×2 IMPLANT
WATER STERILE IRR 1500ML POUR (IV SOLUTION) IMPLANT

## 2015-01-16 NOTE — Transfer of Care (Signed)
Immediate Anesthesia Transfer of Care Note  Patient: Kelly [name]  Procedure(s) Performed: Procedure(s) (LRB): INTRAMEDULLARY (IM) RETROGRADE FEMORAL NAILING (Right)  Patient Location: PACU  Anesthesia Type: Spinal  Level of Consciousness: sedated, patient cooperative and responds to stimulation  Airway & Oxygen Therapy: Patient Spontanous Breathing and Patient connected to face mask oxgen  Post-op Assessment: Report given to PACU RN and Post -op Vital signs reviewed and stable  Post vital signs: Reviewed and stable L-2 level on exam denied pain on assessment.  Complications: No apparent anesthesia complications

## 2015-01-16 NOTE — Progress Notes (Signed)
  CARE MANAGEMENT ED NOTE 01/16/2015  Patient:  REESE, SENK A   Account Number:  [id number]  Date Initiated:  01/16/2015  Documentation initiated by:  Livia Snellen  Subjective/Objective Assessment:   Patient presents to Ed post fall sustaining contusion to right eye and injury to right leg.     Subjective/Objective Assessment Detail:   Xray right knee Displaced distal femoral diaphysis fracture.  2. Prominent osteopenia.     Action/Plan:   Action/Plan Detail:   Anticipated DC Date:       Status Recommendation to Physician:   Result of Recommendation:    Other ED Services  Consult [facility] [place]  Other    Choice offered to / List presented to:  C-1 Patient          Status of service:  Completed, signed off  ED Comments:   ED Comments Detail:  EDCM spoke topatient at bedside.  Patient confirms she lives at home with her husband.  Patient reports she has had home health in the past but cannot remember the name of the agency.  Patient reports she  has a cane, walker, and bedside commode at home.  Patient would like a shower chair, "the larger sized one."  Patient confirms her pcp is Dr. Reynold Bowen.  EDCM provided patient with list of home helath agencies in [facility].  EDCM explained home health services to patient.  Patient thankful for resources.  Patien to have surgery this evening.  No further EDCM needs at this time.

## 2015-01-16 NOTE — Anesthesia Procedure Notes (Signed)
Spinal Patient location during procedure: OR Staffing Anesthesiologist: Montez Hageman Performed by: anesthesiologist  Preanesthetic Checklist Completed: patient identified, site marked, surgical consent, pre-op evaluation, timeout performed, IV checked, risks and benefits discussed and monitors and equipment checked Spinal Block Patient position: right lateral decubitus Prep: Betadine Patient monitoring: heart rate, continuous pulse ox and blood pressure Approach: left paramedian Location: L3-4 Injection technique: single-shot Needle Needle type: Spinocan  Needle gauge: 22 G Needle length: 9 cm Additional Notes Expiration date of kit checked and confirmed. Patient tolerated procedure well, without complications.

## 2015-01-16 NOTE — ED Provider Notes (Signed)
CSN: [id number]     Arrival date & time 01/16/15  1143 History   First MD Initiated Contact with Patient 01/16/15 1153     Chief Complaint  Patient presents with  . Fall  . Knee Pain     (Consider location/radiation/quality/duration/timing/severity/associated sxs/prior Treatment) HPI Comments: 69 year old female with history of hip arthritis, hip replacement by Dr. Ricki Rodriguez presents after mechanical fall when she tripped on the mats at school on her way to the bathroom. Patient landed on the right side and hit her head. Patient is large contusion tender to palpation and decreased mobility of the right leg with pain. Patient denies any significant blood thinners, and no loss of consciousness.  Patient is a 69 y.o. female presenting with fall and knee pain. The history is provided by the patient.  Fall Pertinent negatives include no chest pain, no abdominal pain, no headaches and no shortness of breath.  Knee Pain Associated symptoms: no back pain, no fever and no neck pain     Past Medical History  Diagnosis Date  . Diabetes mellitus without complication   . Arthritis   . Hypertension   . Heart murmur    Past Surgical History  Procedure Laterality Date  . Total hip arthroplasty Right 07/07/2013    Procedure: RIGHT TOTAL HIP ARTHROPLASTY;  Surgeon: Gearlean Alf, MD;  Location: [facility];  Service: Orthopedics;  Laterality: Right;  . I&d extremity Right 08/15/2013    Procedure: INCISION AND DRAINAGE RIGHT HIP INFECTION (PREVIOUS RIGHT TOTAL HIP REPLACEMENT);  Surgeon: Gearlean Alf, MD;  Location: [facility];  Service: Orthopedics;  Laterality: Right;  . Incision and drainage hip Right 09/09/2013    Procedure: IRRIGATION AND DEBRIDEMENT HIP;  Surgeon: Augustin Schooling, MD;  Location: [facility];  Service: Orthopedics;  Laterality: Right;  . I&d extremity Right 09/12/2013    Procedure: IRRIGATION AND DEBRIDEMENT RIGHT  LATERAL THIGH;  Surgeon: Gearlean Alf, MD;  Location: [facility];  Service:  Orthopedics;  Laterality: Right;  . Femur im nail Right 01/16/2015    Procedure: INTRAMEDULLARY (IM) RETROGRADE FEMORAL NAILING;  Surgeon: Gearlean Alf, MD;  Location: [facility];  Service: Orthopedics;  Laterality: Right;   History reviewed. No pertinent family history. History  Substance Use Topics  . Smoking status: Never Smoker   . Smokeless tobacco: Never Used  . Alcohol Use: No   OB History    No data available     Review of Systems  Constitutional: Negative for fever and chills.  HENT: Negative for congestion.   Eyes: Negative for visual disturbance.  Respiratory: Negative for shortness of breath.   Cardiovascular: Negative for chest pain.  Gastrointestinal: Negative for vomiting and abdominal pain.  Genitourinary: Negative for dysuria and flank pain.  Musculoskeletal: Positive for joint swelling and gait problem. Negative for back pain, neck pain and neck stiffness.  Skin: Positive for wound. Negative for rash.  Neurological: Negative for light-headedness and headaches.      Allergies  Tape and Robaxin  Home Medications   Prior to Admission medications   Medication Sig Start Date End Date Taking? Authorizing Provider  aspirin 81 MG tablet Take 81 mg by mouth daily.   Yes Historical Provider, MD  ergocalciferol (VITAMIN D2) 50000 UNITS capsule Take 50,000 Units by mouth once a week. thursdays   Yes Historical Provider, MD  exenatide (BYETTA) 10 MCG/0.04ML SOPN Inject 10 mcg into the skin daily.   Yes Historical Provider, MD  Multiple Vitamins-Minerals (ICAPS AREDS FORMULA PO)  Take 2 tablets by mouth daily.    Yes Historical Provider, MD  pioglitazone-metformin (ACTOPLUS MET) 15-850 MG per tablet Take 1 tablet by mouth every evening.   Yes Historical Provider, MD  ramipril (ALTACE) 10 MG capsule Take 20 mg by mouth every evening.   Yes Historical Provider, MD  simvastatin (ZOCOR) 40 MG tablet Take 40 mg by mouth every evening.   Yes Historical Provider, MD  cephALEXin  (KEFLEX) 250 MG capsule Take 1 capsule (250 mg total) by mouth 3 (three) times daily. Patient not taking: Reported on 01/16/2015 09/13/13   [name], PA-C  glycerin adult (GLYCERIN ADULT) 2 G SUPP Place 1 suppository rectally once. Patient not taking: Reported on 01/16/2015 09/13/13   [name], PA-C  ondansetron (ZOFRAN ODT) 4 MG disintegrating tablet Take 1 tablet (4 mg total) by mouth every 8 (eight) hours as needed for nausea. Patient not taking: Reported on 01/16/2015 09/13/13   [name], PA-C  oxyCODONE (OXY IR/ROXICODONE) 5 MG immediate release tablet Take 1-3 tablets (5-15 mg total) by mouth every 3 (three) hours as needed. Patient not taking: Reported on 01/16/2015 09/13/13   [name], PA-C   BP 141/48 mmHg  Pulse 73  Temp(Src) 98.3 F (36.8 C) (Oral)  Resp 18  Ht 5' 2" (1.575 m)  Wt 230 lb (104.327 kg)  BMI 42.06 kg/m2  SpO2 100% Physical Exam  Constitutional: She is oriented to person, place, and time. She appears well-developed and well-nourished.  HENT:  Head: Normocephalic.  Patient has significant hematoma right for head with mild bleeding controlled with pressure, no significant laceration, mild ecchymosis periorbital.  Eyes: Right eye exhibits no discharge. Left eye exhibits no discharge.  Neck: Normal range of motion. Neck supple. No tracheal deviation present.  Cardiovascular: Normal rate and regular rhythm.   Pulmonary/Chest: Effort normal and breath sounds normal.  Abdominal: Soft. She exhibits no distension. There is no tenderness. There is no guarding.  Musculoskeletal: She exhibits edema and tenderness.  Patient is significant pain with attempt of knee or right hip flexion. Mild ecchymosis anterior knee on the right with mild swelling. Patient has no pain or tenderness left hip or left knee.  Neurological: She is alert and oriented to person, place, and time.  Skin: Skin is warm. No rash noted.  Psychiatric: She has a normal mood and affect.  Nursing note  and vitals reviewed.   ED Course  Procedures (including critical care time) Labs Review Labs Reviewed  BASIC METABOLIC PANEL - Abnormal; Notable for the following:    Glucose, Bld 149 (*)    All other components within normal limits  CBC WITH DIFFERENTIAL/PLATELET - Abnormal; Notable for the following:    WBC 15.7 (*)    Hemoglobin 11.1 (*)    HCT 35.5 (*)    Neutrophils Relative % 83 (*)    Neutro Abs 12.9 (*)    Lymphocytes Relative 11 (*)    All other components within normal limits  GLUCOSE, CAPILLARY - Abnormal; Notable for the following:    Glucose-Capillary 111 (*)    All other components within normal limits  BASIC METABOLIC PANEL - Abnormal; Notable for the following:    Potassium 5.3 (*)    Glucose, Bld 201 (*)    All other components within normal limits  CBC - Abnormal; Notable for the following:    RBC 3.13 (*)    Hemoglobin 9.0 (*)    HCT 28.0 (*)    All other components within normal limits  GLUCOSE, CAPILLARY - Abnormal; Notable for the following:    Glucose-Capillary 187 (*)    All other components within normal limits  GLUCOSE, CAPILLARY - Abnormal; Notable for the following:    Glucose-Capillary 156 (*)    All other components within normal limits  GLUCOSE, CAPILLARY - Abnormal; Notable for the following:    Glucose-Capillary 144 (*)    All other components within normal limits  BASIC METABOLIC PANEL - Abnormal; Notable for the following:    Glucose, Bld 129 (*)    All other components within normal limits  GLUCOSE, CAPILLARY - Abnormal; Notable for the following:    Glucose-Capillary 130 (*)    All other components within normal limits  CBG MONITORING, ED - Abnormal; Notable for the following:    Glucose-Capillary 145 (*)    All other components within normal limits  PROTIME-INR  BASIC METABOLIC PANEL  CBC    Imaging Review Dg Wrist Complete Left  01/16/2015   CLINICAL DATA:  Patient tripped on rug and fell  EXAM: LEFT WRIST - COMPLETE 3+ VIEW   COMPARISON:  None.  FINDINGS: Frontal, oblique, lateral, and ulnar deviation scaphoid images were obtained. Bones are osteoporotic. There is no acute fracture or dislocation. Joint spaces appear intact. No erosive change.  IMPRESSION: Bones osteoporotic. No fracture or dislocation. No appreciable arthropathy.   Electronically Signed   By: Lowella Grip III M.D.   On: 01/16/2015 13:54   Dg Knee 1-2 Views Right  01/16/2015   CLINICAL DATA:  Tripped on overload and fell.  Initial encounter  EXAM: RIGHT KNEE - 1-2 VIEW  COMPARISON:  None.  FINDINGS: Distal femoral diaphysis fracture with comminution and roughly 50% posterior and lateral displacement. There is apex posterior angulation. No indication of laceration to suggest open fracture.  There is mild degenerative spurring around the knee with no significant joint narrowing.  Prominent osteopenia.  IMPRESSION: 1. Displaced distal femoral diaphysis fracture. 2. Prominent osteopenia.   Electronically Signed   By: Monte Fantasia M.D.   On: 01/16/2015 13:52   Ct Head Wo Contrast  01/16/2015   CLINICAL DATA:  Patient hit head during fall  EXAM: CT HEAD WITHOUT CONTRAST  TECHNIQUE: Contiguous axial images were obtained from the base of the skull through the vertex without intravenous contrast.  COMPARISON:  None.  FINDINGS: The ventricles are normal in size and configuration. There is no intracranial mass, hemorrhage, extra-axial fluid collection, or midline shift. There is mild small vessel disease in the centra semiovale bilaterally. Elsewhere gray-white compartments appear normal. No acute infarct is apparent. Bony calvarium appears intact. The mastoid air cells are clear. There is a large right frontal scalp hematoma. There is preseptal orbital edema on the right. No intraorbital lesion is appreciable on this study.  IMPRESSION: Large right frontal scalp and preseptal orbital hematoma. No fracture apparent. There is mild periventricular small vessel disease.  No intracranial mass or hemorrhage. No acute infarct apparent.   Electronically Signed   By: Lowella Grip III M.D.   On: 01/16/2015 13:51   Dg C-arm 61-120 Min-no Report  01/16/2015   CLINICAL DATA: Fractured femur   C-ARM 61-120 MINUTES  Fluoroscopy was utilized by the requesting physician.  No radiographic  interpretation.    Dg Hip Unilat With Pelvis 2-3 Views Right  01/16/2015   CLINICAL DATA:  Right lower extremity pain secondary to a fall.  EXAM: RIGHT HIP (WITH PELVIS) 2-3 VIEWS  COMPARISON:  None.  FINDINGS: The right total hip prosthesis  appears in good position. There is diffuse osteopenia. No acute abnormality of the right hip. Pelvic bones are intact. There is fairly severe degenerative disc and joint disease in the lower lumbar spine.  IMPRESSION: No acute abnormality of the right hip.  Osteopenia.   Electronically Signed   By: Lorriane Shire M.D.   On: 01/16/2015 13:52   Dg Femur, Min 2 Views Right  01/16/2015   CLINICAL DATA:  ORIF right distal femoral fracture  EXAM: RIGHT FEMUR 2 VIEWS  COMPARISON:  None  FLUOROSCOPY TIME:  52 seconds  Total images 5  FINDINGS: Five intraoperative fluoroscopic spot images demonstrate a distal femoral intra medullary nail transfixing a comminuted distal femoral diaphysis fracture. There is mild anterior displacement of the distal fracture fragment relative to the proximal shaft.  IMPRESSION: ORIF right distal femoral fracture.   Electronically Signed   By: Kathreen Devoid   On: 01/16/2015 19:03     EKG Interpretation   Date/Time:  Tuesday January 16 2015 14:49:56 EST Ventricular Rate:  81 PR Interval:  152 QRS Duration: 91 QT Interval:  415 QTC Calculation: 482 R Axis:   -6 Text Interpretation:  Sinus rhythm Minimal ST depression, anterolateral  leads Baseline wander in lead(s) V3 Confirmed by Donald Memoli  MD, Airon Sahni ([id number])  on 01/16/2015 3:01:45 PM      MDM   Final diagnoses:  Fall  Fracture, femur, distal, right, closed, initial encounter   Acute head injury, initial encounter  Scalp hematoma, initial encounter   Patient with mechanical fall and concern for right femur/hip fracture. Pain medicines given, x-ray results reviewed showing significant distal femur fracture in the right with displacement. Paged orthopedics for dimension.  The patients results and plan were reviewed and discussed.   Any x-rays performed were personally reviewed by myself.   Differential diagnosis were considered with the presenting HPI.  Medications  exenatide (BYETTA) injection SOPN 10 mcg (10 mcg Subcutaneous Given by Other 01/17/15 1000)  simvastatin (ZOCOR) tablet 40 mg (40 mg Oral Given 01/16/15 2156)  ondansetron (ZOFRAN) tablet 4 mg (not administered)    Or  ondansetron (ZOFRAN) injection 4 mg (not administered)  metoCLOPramide (REGLAN) tablet 5-10 mg (not administered)    Or  metoCLOPramide (REGLAN) injection 5-10 mg (not administered)  insulin aspart (novoLOG) injection 0-15 Units (2 Units Subcutaneous Given 01/17/15 1316)  enoxaparin (LOVENOX) injection 40 mg (40 mg Subcutaneous Given 01/17/15 0735)  0.9 %  sodium chloride infusion ( Intravenous New Bag/Given 01/17/15 1358)  docusate sodium (COLACE) capsule 100 mg (100 mg Oral Given 01/17/15 0958)  polyethylene glycol (MIRALAX / GLYCOLAX) packet 17 g (not administered)  bisacodyl (DULCOLAX) suppository 10 mg (not administered)  sodium phosphate (FLEET) 7-19 GM/118ML enema 1 enema (not administered)  cyclobenzaprine (FLEXERIL) tablet 5 mg (5 mg Oral Given 01/17/15 1620)  traMADol (ULTRAM) tablet 50-100 mg (not administered)  pioglitazone (ACTOS) tablet 15 mg (not administered)  morphine 2 MG/ML injection 1-2 mg (2 mg Intravenous Given 01/17/15 1355)  morphine 2 MG/ML injection (not administered)  morphine 2 MG/ML injection (not administered)  oxyCODONE (Oxy IR/ROXICODONE) immediate release tablet 5-15 mg (15 mg Oral Given 01/17/15 1620)  morphine 4 MG/ML injection 6 mg (6 mg Intramuscular  Given 01/16/15 1245)  0.9 %  sodium chloride infusion ( Intravenous New Bag/Given 01/16/15 1519)  ceFAZolin (ANCEF) IVPB 2 g/50 mL premix (2 g Intravenous Given 01/16/15 1735)  acetaminophen (OFIRMEV) IV 1,000 mg (1,000 mg Intravenous Given 01/16/15 1756)  ceFAZolin (ANCEF) IVPB 2 g/50 mL premix (  2 g Intravenous Given 01/17/15 1029)    Filed Vitals:   01/17/15 0300 01/17/15 0636 01/17/15 1108 01/17/15 1427  BP: 108/75 118/96 144/37 141/48  Pulse: 73 65 85 73  Temp: 98.2 F (36.8 C) 97.7 F (36.5 C) 98.1 F (36.7 C) 98.3 F (36.8 C)  TempSrc: Oral Oral Oral Oral  Resp: _0 Height:      Weight:      SpO2: 100% 100% 95% 100%    Final diagnoses:  Fall  Fracture, femur, distal, right, closed, initial encounter  Acute head injury, initial encounter  Scalp hematoma, initial encounter    Admission/ observation were discussed with the admitting physician, patient and/or family and they are comfortable with the plan.     Mariea Clonts, MD 01/17/15 [phone]

## 2015-01-16 NOTE — Brief Op Note (Signed)
01/16/2015  7:10 PM  PATIENT:  Kelly [name]  69 y.o. female  PRE-OPERATIVE DIAGNOSIS:  RIGHT DISTAL FEMUR FRACTURE  POST-OPERATIVE DIAGNOSIS:  right distal femur fracture  PROCEDURE:  Procedure(s): INTRAMEDULLARY (IM) RETROGRADE FEMORAL NAILING (Right)  SURGEON:  Surgeon(s) and Role:    * Gearlean Alf, MD - Primary  PHYSICIAN ASSISTANT:   ASSISTANTS: [name], PA-C   ANESTHESIA:   spinal  EBL:   100 ml  BLOOD ADMINISTERED:none  DRAINS: (Medium) Hemovact drain(s) in the right knee with  Suction Open   LOCAL MEDICATIONS USED:  NONE  COUNTS:  YES  TOURNIQUET:  * No tourniquets in log *  DICTATION: .Other Dictation: Dictation Number [id number]  PLAN OF CARE: Admit to inpatient   PATIENT DISPOSITION:  PACU - hemodynamically stable.

## 2015-01-16 NOTE — H&P (Signed)
Admission History and Physical  Patient ID: Kelly [name] MRN: [id number] DOB/AGE: September 25, 1946 69 y.o.  Date of Admission: 01/16/2014  Chief Complaint:  Right Knee Pain Patient presents with a knee injury involving the right knee. Onset of the symptoms was today. Inciting event: injured during a fall while tripping over a cushioned mat. Current symptoms include pain located above the right knee. Pain is aggravated by any weight bearing. Patient has had no prior knee problems. Evaluation to date: plain films: abnormal for distal displaced right femur fracture. Treatment to date: pain medication and radiographs in the emergency department at [facility].  Chief Complaint  Patient presents with  . Fall  . Knee Pain    HPI:  Patient is a 69 year old female seen today in the [facility] ED following injuries sustained during a fall earlier today.  She tripped over a cushioned mat and fell onto the right knee and leg.  She denies LOC, dizziness, or syncopal episode associated with the the fall.  She also sustained a contusion and "black eye" to the right side.  She underwent a CT scan of her head during the [facility] ED workup.  She has been NPO since breakfast early this morning.  Fall after tripping over a mat. Mechanical fall. Severe pain.  Allergies: Allergies  Allergen Reactions  . Tape Other (See Comments)    Tears skin  . Robaxin [Methocarbamol] Nausea And Vomiting     Medications: aspirin 81 MG tablet Take 81 mg by mouth daily. Historical Provider, MD Needs Review  ergocalciferol (VITAMIN D2) 50000 UNITS capsule Take 50,000 Units by mouth once a week. thursdays Historical Provider, MD Needs Review exenatide (BYETTA) 10 MCG/0.04ML SOPN Inject 10 mcg into the skin daily. Historical Provider, MD Needs Review Multiple Vitamins-Minerals (ICAPS AREDS FORMULA PO) Take 2 tablets by mouth daily. Historical Provider, MD Needs Review pioglitazone-metformin (ACTOPLUS MET) 15-850 MG per tablet Take 1 tablet by mouth  every evening. Historical Provider, MD Needs Review ramipril (ALTACE) 10 MG capsule Take 20 mg by mouth every evening. Historical Provider, MD Needs Review simvastatin (ZOCOR) 40 MG tablet Take 40 mg by mouth every evening. Historical Provider, MD Needs Review cephALEXin (KEFLEX) 250 MG capsule Take 1 capsule (250 mg total) by mouth 3 (three) times daily. [name], PA-C Needs Review Patient not taking: Reported on 01/16/2015 glycerin adult (GLYCERIN ADULT) 2 G SUPP Place 1 suppository rectally once. [name], PA-C Needs Review Patient not taking: Reported on 01/16/2015 ondansetron (ZOFRAN ODT) 4 MG disintegrating tablet Take 1 tablet (4 mg total) by mouth every 8 (eight) hours as needed for nausea. [name], PA-C Needs Review Patient not taking: Reported on 01/16/2015 oxyCODONE (OXY IR/ROXICODONE) 5 MG immediate release tablet Take 1-3 tablets (5-15 mg total) by mouth every 3 (three) hours as needed.   Past Medical History: Past Medical History  Diagnosis Date  . Diabetes mellitus without complication   . Arthritis   . Hypertension   . Heart murmur      Past Surgical History: Past Surgical History  Procedure Laterality Date  . Total hip arthroplasty Right 07/07/2013    Procedure: RIGHT TOTAL HIP ARTHROPLASTY;  Surgeon: Gearlean Alf, MD;  Location: [facility] ORS;  Service: Orthopedics;  Laterality: Right;  . I&d extremity Right 08/15/2013    Procedure: INCISION AND DRAINAGE RIGHT HIP INFECTION (PREVIOUS RIGHT TOTAL HIP REPLACEMENT);  Surgeon: Gearlean Alf, MD;  Location: [facility] ORS;  Service: Orthopedics;  Laterality: Right;  . Incision and drainage hip Right  09/09/2013    Procedure: IRRIGATION AND DEBRIDEMENT HIP;  Surgeon: Augustin Schooling, MD;  Location: [facility] ORS;  Service: Orthopedics;  Laterality: Right;  . I&d extremity Right 09/12/2013    Procedure: IRRIGATION AND DEBRIDEMENT RIGHT  LATERAL THIGH;  Surgeon: Gearlean Alf, MD;  Location: [facility] OR[facility];  Service: Orthopedics;  Laterality: Right;      Family History: History reviewed. No pertinent family history.  Social History: History  Substance Use Topics  . Smoking status: Never Smoker   . Smokeless tobacco: Never Used  . Alcohol Use: No     Review of Systems Constitutional: negative Respiratory: negative Cardiovascular: negative Gastrointestinal: negative Genitourinary:negative Musculoskeletal:positive for right leg and knee pain  Physical Exam:  Vital Signs: Filed Vitals:   01/16/15 1529  BP: 141/43  Pulse: 75  Temp:   Resp: 18    General: alert, cooperative and mild distress HENT:Head: Normocephalic, without obvious abnormality, atraumatic, normocephalic but does show moderate periorbital swelling and ecchymosis about the right eye Neck:supple Chest:clear to auscultation, no wheezes, rales or rhonchi, symmetric air entry,  Heart:S1, S2 normal, no murmur, rub or gallop, regular rate and rhythm Abdomen:abdomen soft, obese and normal bowel sounds Rectal/Breast/Genitalia: Not done, not pertinent to present illness. Musculoskeletal:neurovascular intact to the right leg.  Moving foot and toes on exam, pain noted on any movement of the right leg on exam.  LABS: Current lab results:  Recent Labs  01/16/15 1413  WBC 15.7*  HGB 11.1*  HCT 35.5*    X-RAYS:Dg Wrist Complete Left  01/16/2015   CLINICAL DATA:  Patient tripped on rug and fell  EXAM: LEFT WRIST - COMPLETE 3+ VIEW  COMPARISON:  None.  FINDINGS: Frontal, oblique, lateral, and ulnar deviation scaphoid images were obtained. Bones are osteoporotic. There is no acute fracture or dislocation. Joint spaces appear intact. No erosive change.  IMPRESSION: Bones osteoporotic. No fracture or dislocation. No appreciable arthropathy.   Electronically Signed   By: Lowella Grip III M.D.   On: 01/16/2015 13:54   Dg Knee 1-2 Views Right  01/16/2015   CLINICAL DATA:  Tripped on overload and fell.  Initial encounter  EXAM: RIGHT KNEE - 1-2 VIEW  COMPARISON:  None.   FINDINGS: Distal femoral diaphysis fracture with comminution and roughly 50% posterior and lateral displacement. There is apex posterior angulation. No indication of laceration to suggest open fracture.  There is mild degenerative spurring around the knee with no significant joint narrowing.  Prominent osteopenia.  IMPRESSION: 1. Displaced distal femoral diaphysis fracture. 2. Prominent osteopenia.   Electronically Signed   By: Monte Fantasia M.D.   On: 01/16/2015 13:52   Ct Head Wo Contrast  01/16/2015   CLINICAL DATA:  Patient hit head during fall  EXAM: CT HEAD WITHOUT CONTRAST  TECHNIQUE: Contiguous axial images were obtained from the base of the skull through the vertex without intravenous contrast.  COMPARISON:  None.  FINDINGS: The ventricles are normal in size and configuration. There is no intracranial mass, hemorrhage, extra-axial fluid collection, or midline shift. There is mild small vessel disease in the centra semiovale bilaterally. Elsewhere gray-white compartments appear normal. No acute infarct is apparent. Bony calvarium appears intact. The mastoid air cells are clear. There is a large right frontal scalp hematoma. There is preseptal orbital edema on the right. No intraorbital lesion is appreciable on this study.  IMPRESSION: Large right frontal scalp and preseptal orbital hematoma. No fracture apparent. There is mild periventricular small vessel disease. No intracranial mass or hemorrhage.  No acute infarct apparent.   Electronically Signed   By: Lowella Grip III M.D.   On: 01/16/2015 13:51   Dg Hip Unilat With Pelvis 2-3 Views Right  01/16/2015   CLINICAL DATA:  Right lower extremity pain secondary to a fall.  EXAM: RIGHT HIP (WITH PELVIS) 2-3 VIEWS  COMPARISON:  None.  FINDINGS: The right total hip prosthesis appears in good position. There is diffuse osteopenia. No acute abnormality of the right hip. Pelvic bones are intact. There is fairly severe degenerative disc and joint disease in  the lower lumbar spine.  IMPRESSION: No acute abnormality of the right hip.  Osteopenia.   Electronically Signed   By: Lorriane Shire M.D.   On: 01/16/2015 13:52    EKG: Orders placed or performed during the hospital encounter of 01/16/15  . ED EKG  . ED EKG  . EKG 12-Lead  . EKG 12-Lead    Impression: Displaced closed distal right supracondylar femur fracture  Plan: Patient has been seen by Dr. Wynelle Link in the emergency department for the above stated issue.  The findings and radiographs have been discussed with the patient and her family.  It is felt that she will require surgical fixation for the fracture.  Risks and benefits have been discussed with the patient and she would like to proceed with surgical fixation.  The operating room was contacted and the patient was added onto the evening add on schedule.  She will be taken to the OR tonight to undergo IM nailing versus ORIF of the right femur fracture per Dr. Wynelle Link.  PERKINS, ALEXZANDREW  01/16/2015, 4:07 PM  I have seen and examined the patient and agree with the above. She has a displaced distal femur fracture in the same thigh as her total hip arthroplasty. This limits some of our options for fixation. Will plan on supracondylar IM nail which will be short enough to avoid the femoral component of the hip. If I feel it is not stable fixation, then may have to do ORIF with a supracondylar plate. I have discussed these options with the patient and her family including procedure, risks and potential complications and she elects to proceed.

## 2015-01-16 NOTE — ED Notes (Signed)
Bed: WA19 Expected date:  Expected time:  Means of arrival:  Comments: EMS-fall 

## 2015-01-16 NOTE — Progress Notes (Signed)
CSW met with patient at bedside.  Patient confirms that she presents to [facility] due to falling in the cafeteria today. Patient informed CSW that she lives in [place] with her husband, and has been able to complete her ADL's independently.   Patient states that she does not fall often. Patient states that she is not interested in additional help at home of going to a facility at this time. Patient does not have any further questions at this time.  Willette Brace [phone] ED CSW 01/16/2015 4:58 PM

## 2015-01-16 NOTE — Anesthesia Postprocedure Evaluation (Signed)
  Anesthesia Post-op Note  Patient: Kelly [name]  Procedure(s) Performed: Procedure(s) (LRB): INTRAMEDULLARY (IM) RETROGRADE FEMORAL NAILING (Right)  Patient Location: PACU  Anesthesia Type: Spinal  Level of Consciousness: awake and alert   Airway and Oxygen Therapy: Patient Spontanous Breathing  Post-op Pain: mild  Post-op Assessment: Post-op Vital signs reviewed, Patient's Cardiovascular Status Stable, Respiratory Function Stable, Patent Airway and No signs of Nausea or vomiting  Last Vitals:  Filed Vitals:   01/16/15 1529  BP: 141/43  Pulse: 75  Temp:   Resp: 18    Post-op Vital Signs: stable   Complications: No apparent anesthesia complications

## 2015-01-16 NOTE — ED Notes (Signed)
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  Pt tripped over mat and landed on knees.  Struck head on door on the way down.  No LOC. No blood thinners.  Pt c/o rt knee pain, forehead, and left hand/wrist.  Pt does state she went all the way down on floor.  Previous hip replacement.  Denies hip pain.  Vitals: 130/64, hr 80, resp 16 100% ra

## 2015-01-16 NOTE — Interval H&P Note (Signed)
History and Physical Interval Note:  01/16/2015 5:16 PM  Kelly [name]  has presented today for surgery, with the diagnosis of RIGHT DISTAL FEMUR FRACTURE  The various methods of treatment have been discussed with the patient and family. After consideration of risks, benefits and other options for treatment, the patient has consented to  Procedure(s): INTRAMEDULLARY (IM) RETROGRADE FEMORAL NAILING (Right) as a surgical intervention .  The patient's history has been reviewed, patient examined, no change in status, stable for surgery.  I have reviewed the patient's chart and labs.  Questions were answered to the patient's satisfaction.     Gearlean Alf

## 2015-01-16 NOTE — Anesthesia Preprocedure Evaluation (Addendum)
Anesthesia Evaluation  Patient identified by MRN, date of birth, ID band Patient awake    Reviewed: Allergy & Precautions, H&P , NPO status , Patient's Chart, lab work & pertinent test results  Airway Mallampati: II  TM Distance: >3 FB Neck ROM: full    Dental no notable dental hx. (+) Teeth Intact, Dental Advisory Given   Pulmonary neg pulmonary ROS,  breath sounds clear to auscultation  Pulmonary exam normal       Cardiovascular hypertension, Pt. on medications Rhythm:regular Rate:Normal     Neuro/Psych negative neurological ROS  negative psych ROS   GI/Hepatic negative GI ROS, Neg liver ROS,   Endo/Other  negative endocrine ROSdiabetes, Well Controlled, Type 2, Oral Hypoglycemic AgentsMorbid obesity  Renal/GU negative Renal ROS  negative genitourinary   Musculoskeletal   Abdominal (+) + obese,   Peds  Hematology negative hematology ROS (+) Blood dyscrasia, anemia ,   Anesthesia Other Findings   Reproductive/Obstetrics negative OB ROS                            Anesthesia Physical  Anesthesia Plan  ASA: II  Anesthesia Plan: Spinal   Post-op Pain Management:    Induction:   Airway Management Planned: Simple Face Mask  Additional Equipment:   Intra-op Plan:   Post-operative Plan:   Informed Consent: I have reviewed the patients History and Physical, chart, labs and discussed the procedure including the risks, benefits and alternatives for the proposed anesthesia with the patient or authorized representative who has indicated his/her understanding and acceptance.   Dental Advisory Given  Plan Discussed with: CRNA and Surgeon  Anesthesia Plan Comments:        Anesthesia Quick Evaluation

## 2015-01-17 ENCOUNTER — Encounter (HOSPITAL_COMMUNITY): Payer: Self-pay | Admitting: Orthopedic Surgery

## 2015-01-17 LAB — CBC
HCT: 28 % — ABNORMAL LOW (ref 36.0–46.0)
Hemoglobin: 9 g/dL — ABNORMAL LOW (ref 12.0–15.0)
MCH: 28.8 pg (ref 26.0–34.0)
MCHC: 32.1 g/dL (ref 30.0–36.0)
MCV: 89.5 fL (ref 78.0–100.0)
Platelets: 236 10*3/uL (ref 150–400)
RBC: 3.13 MIL/uL — ABNORMAL LOW (ref 3.87–5.11)
RDW: 15.5 % (ref 11.5–15.5)
WBC: 8.1 10*3/uL (ref 4.0–10.5)

## 2015-01-17 LAB — BASIC METABOLIC PANEL
ANION GAP: 8 (ref 5–15)
Anion gap: 7 (ref 5–15)
BUN: 15 mg/dL (ref 6–23)
BUN: 18 mg/dL (ref 6–23)
CHLORIDE: 107 mmol/L (ref 96–112)
CHLORIDE: 107 mmol/L (ref 96–112)
CO2: 26 mmol/L (ref 19–32)
CO2: 26 mmol/L (ref 19–32)
CREATININE: 0.55 mg/dL (ref 0.50–1.10)
Calcium: 8.6 mg/dL (ref 8.4–10.5)
Calcium: 8.5 mg/dL (ref 8.4–10.5)
Creatinine, Ser: 0.59 mg/dL (ref 0.50–1.10)
GFR calc Af Amer: >90 mL/min (ref >90)
GFR calc non Af Amer: >90 mL/min (ref >90)
GFR calc non Af Amer: >90 mL/min (ref >90)
Glucose, Bld: 201 mg/dL — ABNORMAL HIGH (ref 70–99)
Glucose, Bld: 129 mg/dL — ABNORMAL HIGH (ref 70–99)
POTASSIUM: 4.1 mmol/L (ref 3.5–5.1)
Potassium: 5.3 mmol/L — ABNORMAL HIGH (ref 3.5–5.1)
SODIUM: 140 mmol/L (ref 135–145)
Sodium: 141 mmol/L (ref 135–145)

## 2015-01-17 LAB — GLUCOSE, CAPILLARY
GLUCOSE-CAPILLARY: 130 mg/dL — AB (ref 70–99)
GLUCOSE-CAPILLARY: 121 mg/dL — AB (ref 70–99)
Glucose-Capillary: 156 mg/dL — ABNORMAL HIGH (ref 70–99)
Glucose-Capillary: 144 mg/dL — ABNORMAL HIGH (ref 70–99)

## 2015-01-17 MED ORDER — MORPHINE SULFATE 2 MG/ML IJ SOLN
1.0000 mg | INTRAMUSCULAR | Status: AC | PRN
  Administered 2015-01-17 (×4): 2 mg via INTRAVENOUS
  Filled 2015-01-17 (×4): qty 1

## 2015-01-17 MED ORDER — MORPHINE SULFATE 2 MG/ML IJ SOLN
INTRAMUSCULAR | Status: AC
  Filled 2015-01-17: qty 1

## 2015-01-17 MED ORDER — OXYCODONE HCL 5 MG PO TABS
5.0000 mg | ORAL_TABLET | ORAL | Status: DC | PRN
  Administered 2015-01-17 – 2015-01-18 (×4): 15 mg via ORAL
  Administered 2015-01-18: 10 mg via ORAL
  Administered 2015-01-18: 15 mg via ORAL
  Administered 2015-01-18: 5 mg via ORAL
  Administered 2015-01-18 – 2015-01-20 (×13): 15 mg via ORAL
  Filled 2015-01-17 (×19): qty 3

## 2015-01-17 NOTE — Care Management Note (Addendum)
Page 1 of 2   01/19/2015     3:15:22 PM CARE MANAGEMENT NOTE 01/19/2015  Patient:  KESLEE, HARRINGTON A   Account Number:  [id number]  Date Initiated:  01/17/2015  Documentation initiated by:  [facility]  Subjective/Objective Assessment:   adm: INTRAMEDULLARY (IM) RETROGRADE FEMORAL NAILING (Right)     Action/Plan:   discharge planning   Anticipated DC Date:  01/20/2015   Anticipated DC Plan:  [place]  CM consult      [facility] Choice  NA   Choice offered to / List presented to:     DME arranged  3-N-1  [place]           Status of service:  In process, will continue to follow Medicare Important Message given?   (If response is "NO", the following Medicare IM given date fields will be blank) Date Medicare IM given:   Medicare IM given by:   Date Additional Medicare IM given:   Additional Medicare IM given by:    Discharge Disposition:  [place]  Per UR Regulation:  Reviewed for med. necessity/level of care/duration of stay  If discussed at [employer] of Stay Meetings, dates discussed:    Comments:  01/19/15 Dessa Phi RN BSN NCM [phone] 3:10p-Per wroker's comp RN CM [facility] agency has auth for [facility]. TC Cleora Fleet rep aware of Sat/Sun to call patient directly to set up home visit.TNT dme company for dme-they will make arrangements for delivery of dme to patient's home.Please fax d/c summary @ d/c-[facility] [place] fax#[phone].  Worker's comp Transport planner tel#[phone],& this CM went into patient's rm to discuss d/c plans.Patient plans to d/c home w/HH/DME.Has spouse support Yvone Neu tel#[phone],& also patient's son for support.PT to work w/patient again today.For d/c in am.Faxed to[facility] fax w/confirmation:fax#[phone]-w/c,hospital bed,3n1,shower chair,rw, also hhpt/hh Education officer, museum orders,w/face to  face.worker's comp RN-[facility] will contact their dme liason,& HHC liason for services.I have contacted [employer] rep Tim-[place] of possible [facility] services if approved by worker's comp.Await d/c order, & confirmation of worker's comp approved [place] [facility] agency, & DME rep.Please fax d/c summary to [facility] fax#[phone], & Becky [phone] @ d/c.  01/19/15 08:20 CM Freddrick March and left number of contact Agustina Caroli [phone] who will be at pt's home today to receive DME.  When pt is ready for discharge and DME is in place, please contact CSW to arrange non-emergency transport.  Mariane Masters, BSn, CM [phone].  08:20 Cm faxed facesheet, F2F, H&P, OP Note, PT EVAL and orders to [facility] and is waiting for callback to confirm arangements.  No other Cm needs were communicatred.  Mariane Masters, BSN, Cm [phone].  01/19/15 LATE ENTRY: CM received callback from Daune Perch lst evening giving [employer] as [facility] adjuster: Henrene Pastor Number: [id number] and can be reached at [phone] ext [phone].  Fax number is [phone].  Orders have been requested and need to be faxed to the aforementioned number for DME and HHPT.  Waiting for orders.  Mariane Masters, BSN, [place] [phone].  01/18/15 14:35 CM called Daune Perch (insurance provider) as Cm had not received callback; Mr. Gilford Rile does not have an Adjuster or Claim number as of yet but states he is working on it and will call me back as  soon as he is able.  Will continue to follow to fax necessary referral and DMEorders to [facility].  Mariane Masters, BSN, [place].  01/17/15 09:00 Cm met with pt in room to discuss disposiiton; undetermined at this time; dependent upon progress.  CM called [employer] number on [facility]; unfortunately this is the employer number who referred me to Daune Perch [phone] who states he is unsure at this time the name of the Adjuster but is working on the claim and will call me morning of 01/18/15 with correct WC adjuster,  subscriber number and contact number. CM will arrange with adjuster [facility] services and DME when information is available.  Will continue to monitor.  Mariane Masters, BSN, [phone].

## 2015-01-17 NOTE — Op Note (Signed)
NAMECONYA, ELLINWOOD NO.:  [id number]  MEDICAL RECORD NO.:  [id number]  LOCATION:  41                         FACILITY:  [facility]  PHYSICIAN:  Gaynelle Arabian, M.D.    DATE OF BIRTH:  17-Aug-1946  DATE OF PROCEDURE:  01/16/2015 DATE OF DISCHARGE:                              OPERATIVE REPORT   PREOPERATIVE DIAGNOSIS:  Right distal femur fracture.  POSTOPERATIVE DIAGNOSIS:  Right distal femur fracture.  PROCEDURE:  Retrograde intramedullary nailing of right distal femur fracture.  SURGEON:  Gaynelle Arabian, MD.  ASSISTANT:  Alexzandrew L. Perkins, PA-C.  ANESTHESIA:  Spinal.  ESTIMATED BLOOD LOSS:  100 mL.  DRAINS:  Hemovac x1.  COMPLICATIONS:  None.  CONDITION:  Stable to recovery.  BRIEF CLINICAL NOTE:  Ms. Grupe is a 69 year old female, who I previously did a total hip arthroplasty on, who had a fall earlier today landing on a flexed right knee with immediate pain and deformity above the knee.  She was taken to the emergency room where evaluation revealed a displaced supracondylar distal femur fracture.  She presents now for operative fixation.  PROCEDURE IN DETAIL:  After successful administration of spinal anesthetic, the patient was placed supine on the operating table and plastic drapes were placed around the proximal thigh to isolate from her perineum and then the right lower extremity was prepped and draped in usual sterile fashion.  We were unable to utilize a tourniquet due to the large girth of her leg.  I then flexed the knee and placed it on a radiolucent triangle and made a midline knee incision.  Skin cut with a 10 blade through subcutaneous tissue to the extensor mechanism.  An electric cautery was used to make a medial arthrotomy.  The patella was subluxed laterally to reveal the intercondylar notch of the femur.  I then used the starting drill for the VersaNail for the retrograde femoral supracondylar nailing.  The guide pin was  drilled into the distal femur and then the starter reamer was passed over that to create the entry hole for the nail.  The beaded guide rod was then passed across the distal femur to the fracture site and into the proximal portion of the fracture.  We passed this all the way up to the plug for the cement mantle for the total hip.  This was actually an excellent reduction, both AP and lateral with excellent restoration of length and alignment.  The length of this would be 18 mm, but it only came to 15 and to 20.  Given that we were up against the restrictor for the cement mantle, I could not go up to 20.  We thus went with a 15 cm length nail. We reamed over the guide rod up to 13 mm for placement of a 12 mm diameter nail.  The 12 x 15 nail was then attached to the insertion guide and the external alignment guide was attached to that.  The guide rod was then switched over from a beaded tip guide rod to a smooth tip guide rod.  The nail was passed over the guide rod across the fracture site and into the femoral shaft.  The guide rod was  then removed.  We again reduced the fracture at length and proper alignment.  Through the external guide, we passed the sleeve and made incision and then 3 screws were placed distally in the femur, which was proximally in the nail. Additional 2 screws were placed to interlock the nail in the femoral shaft.  We took AP and lateral x-rays.  I was very pleased with the reduction of the fracture and the alignment.  The external guide was then removed.  We thoroughly irrigated the wounds with saline solution. The knee was closed over Hemovac drain with a running #1 V-Loc suture for the arthrotomy.  Subcu was closed with interrupted 2-0 Vicryl and skin with staples.  The interlock incisions were closed deep with 0 Vicryl, subcu 2-0 Vicryl, and skin staples.  The incisions were cleaned and dried and a bulky sterile dressing applied.  Due to the size of her leg, we  were unable to place a knee immobilizer.  She was subsequently awakened and transported to recovery in stable condition.  Note that the surgical assistant was a medical necessity for this complex procedure.  Assistant was necessary for retraction of vital structures and for proper traction and alignment of the limb, both for reducing the fracture and for maintaining reduction of the fracture while placing the hardware.     Gaynelle Arabian, M.D.     FA/MEDQ  D:  01/16/2015  T:  01/17/2015  Job:  [id number]

## 2015-01-17 NOTE — Progress Notes (Signed)
OT Cancellation Note  Patient Details Name: AHNI BRADWELL MRN: [id number] DOB: [birth year]   Cancelled Treatment:    Reason Eval/Treat Not Completed: Other (comment).  Pt is moving slowly with PT:  Limited by nausea and pain.  Will check back on her tomorrow.  Tanza Pellot 01/17/2015, 12:44 PM  Lesle Chris, OTR/L [phone] 01/17/2015

## 2015-01-17 NOTE — Evaluation (Signed)
Physical Therapy Evaluation Patient Details Name: Kelly [name] MRN: [id number] DOB: [birth year] Today's Date: 01/17/2015   History of Present Illness  Pt is a 69 year old female s/p retrograde intramedullary nailing of right distal femur fracture due to fall in cafeteria also with right eye swelling and ecchymosis  Clinical Impression  Patient is s/p above surgery resulting in functional limitations due to the deficits listed below (see PT Problem List).  Patient will benefit from skilled PT to increase their independence and safety with mobility to allow discharge to the venue listed below.  Pt educated on TDWB status.  Pt with very limited mobility today due to pain, nausea, and dizziness.  Will continue to assess progress however recommend SNF at this time.       Follow Up Recommendations SNF;Supervision/Assistance - 24 hour    Equipment Recommendations  Wheelchair (measurements PT);Hospital bed;Other (comment) (elevating leg rests for W/C)    Recommendations for Other Services       Precautions / Restrictions Precautions Precautions: Fall Restrictions Weight Bearing Restrictions: Yes RLE Weight Bearing: Touchdown weight bearing      Mobility  Bed Mobility Overal bed mobility: Needs Assistance;+2 for physical assistance Bed Mobility: Supine to Sit;Sit to Supine     Supine to sit: Mod assist;+2 for physical assistance;HOB elevated Sit to supine: Mod assist;+2 for physical assistance   General bed mobility comments: assist for R LE and scooting to EOB upon sitting and assist for LEs onto bed upon return to supine  Transfers Overall transfer level: Needs assistance Equipment used: Rolling walker (2 wheeled) Transfers: Sit to/from Stand Sit to Stand: Mod assist;+2 physical assistance;From elevated surface         General transfer comment: attempted multiple times as pt having difficulty rising, also with occasional nausea and dizziness, able to assist to standing  and tolerated approx 1 min standing before return to sitting, difficulty at first with TDWB however improved; pt also with L wrist pain limiting WBing on RW so may provide platform to RW next visit  Ambulation/Gait                Stairs            Wheelchair Mobility    Modified Rankin (Stroke Patients Only)       Balance                                             Pertinent Vitals/Pain Pain Assessment: 0-10 Pain Score: 5  Pain Location: R LE Pain Descriptors / Indicators: Sore Pain Intervention(s): Limited activity within patient's tolerance;Monitored during session;Premedicated before session;Repositioned;Ice applied    Home Living Family/patient expects to be discharged to:: Private residence Living Arrangements: Spouse/significant other Available Help at Discharge: Family Type of Home: House Home Access: Stairs to enter     Home Layout: One level Home Equipment: Environmental consultant - 2 wheels;Cane - single point      Prior Function Level of Independence: Independent               Hand Dominance        Extremity/Trunk Assessment               Lower Extremity Assessment: RLE deficits/detail RLE Deficits / Details: mostly maintained knee closer to extension today for pain control, required assist and support for R LE movement  Communication   Communication: No difficulties  Cognition Arousal/Alertness: Awake/alert Behavior During Therapy: WFL for tasks assessed/performed Overall Cognitive Status: Within Functional Limits for tasks assessed                      General Comments      Exercises        Assessment/Plan    PT Assessment Patient needs continued PT services  PT Diagnosis Difficulty walking;Acute pain   PT Problem List Decreased strength;Decreased activity tolerance;Decreased mobility;Decreased knowledge of precautions;Decreased knowledge of use of DME;Obesity;Pain  PT Treatment Interventions  DME instruction;Gait training;Functional mobility training;Patient/family education;Therapeutic activities;Therapeutic exercise;Wheelchair mobility training   PT Goals (Current goals can be found in the Care Plan section) Acute Rehab PT Goals PT Goal Formulation: With patient/family Time For Goal Achievement: 01/24/15 Potential to Achieve Goals: Good    Frequency Min 5X/week   Barriers to discharge        Co-evaluation               End of Session   Activity Tolerance: Patient limited by pain Patient left: in bed;with call bell/phone within reach;with family/visitor present           Time: 4585-9292 PT Time Calculation (min) (ACUTE ONLY): 20 min   Charges:   PT Evaluation $Initial PT Evaluation Tier I: 1 Procedure     PT G Codes:        Alajia Schmelzer,KATHrine E 01/17/2015, 12:29 PM Carmelia Bake, PT, DPT 01/17/2015 Pager: [phone]

## 2015-01-17 NOTE — Progress Notes (Signed)
Clinical Social Work Department CLINICAL SOCIAL WORK PLACEMENT NOTE 01/17/2015  Patient:  Kelly [name], Kelly [name]  Account Number:  [id number] Admit date:  01/16/2015  Clinical Social Worker:  [name], LCSW  Date/time:  01/17/2015 01:24 PM  Clinical Social Work is seeking post-discharge placement for this patient at the following level of care:   SKILLED NURSING   (*CSW will update this form in Epic as items are completed)     Patient/family provided with [place] Department of Clinical Social Work's list of facilities offering this level of care within the geographic area requested by the patient (or if unable, by the patient's family).  01/17/2015  Patient/family informed of their freedom to choose among providers that offer the needed level of care, that participate in Medicare, Medicaid or managed care program needed by the patient, have an available bed and are willing to accept the patient.    Patient/family informed of [facility]' ownership interest in [facility], as well as of the fact that they are under no obligation to receive care at this facility.  PASARR submitted to EDS on 01/17/2015 PASARR number received on 01/17/2015  FL2 transmitted to all facilities in geographic area requested by pt/family on  01/17/2015 FL2 transmitted to all facilities within larger geographic area on   Patient informed that his/her managed care company has contracts with or will negotiate with  certain facilities, including the following:     Patient/family informed of bed offers received:   Patient chooses bed at  Physician recommends and patient chooses bed at    Patient to be transferred to  on   Patient to be transferred to facility by  Patient and family notified of transfer on  Name of family member notified:    The following physician request were entered in Epic:   Additional Comments:  [name] LCSW [phone]

## 2015-01-17 NOTE — Progress Notes (Signed)
Clinical Social Work Department BRIEF PSYCHOSOCIAL ASSESSMENT 01/17/2015  Patient:  Kelly [name], Kelly [name]     Account Number:  [id number]     Admit date:  01/16/2015  Clinical Social Worker:  Lacie Scotts  Date/Time:  01/17/2015 12:54 PM  Referred by:  Care Management  Date Referred:  01/17/2015 Referred for  SNF Placement   Other Referral:   Interview type:  Patient Other interview type:    PSYCHOSOCIAL DATA Living Status:  HUSBAND Admitted from facility:   Level of care:   Primary support name:  Kelly [name] Primary support relationship to patient:  SPOUSE Degree of support available:   supportive    CURRENT CONCERNS Current Concerns  Post-Acute Placement   Other Concerns:    SOCIAL WORK ASSESSMENT / PLAN Pt is [name] 69 yr old female living at home prior to hospitalization. Pt tripped on [name] mat at work and fell. She fx her femur which required surgery. PT has evaluated pt and recommends ST Rehab if pt is slow to progress. Pt will consider this option and has given csw permission to initiate SNF search. Pt has workers Charity fundraiser which must approve ST Rehab placement, if needed.   Assessment/plan status:  Psychosocial Support/Ongoing Assessment of Needs Other assessment/ plan:   Information/referral to community resources:   Insurance coverage for SNF reviewed.    PATIENT'S/FAMILY'S RESPONSE TO PLAN OF CARE: " I'm uncomfortable. I didn't sleep well last night." Pt is hoping to return home with [facility] services. She is motivated to work with therapy but is having some difficulty. " I'll keep trying. I really want to go home. "   Kelly Lean LCSW [phone]

## 2015-01-18 LAB — CBC
HEMATOCRIT: 25.2 % — AB (ref 36.0–46.0)
Hemoglobin: 8.1 g/dL — ABNORMAL LOW (ref 12.0–15.0)
MCH: 28.9 pg (ref 26.0–34.0)
MCHC: 32.1 g/dL (ref 30.0–36.0)
MCV: 90 fL (ref 78.0–100.0)
Platelets: 215 10*3/uL (ref 150–400)
RBC: 2.8 MIL/uL — ABNORMAL LOW (ref 3.87–5.11)
RDW: 15.8 % — AB (ref 11.5–15.5)
WBC: 9.3 10*3/uL (ref 4.0–10.5)

## 2015-01-18 LAB — BASIC METABOLIC PANEL
Anion gap: 8 (ref 5–15)
BUN: 16 mg/dL (ref 6–23)
CALCIUM: 8.3 mg/dL — AB (ref 8.4–10.5)
CHLORIDE: 107 mmol/L (ref 96–112)
CO2: 23 mmol/L (ref 19–32)
Creatinine, Ser: 0.5 mg/dL (ref 0.50–1.10)
Glucose, Bld: 160 mg/dL — ABNORMAL HIGH (ref 70–99)
Potassium: 4.2 mmol/L (ref 3.5–5.1)
Sodium: 138 mmol/L (ref 135–145)

## 2015-01-18 LAB — GLUCOSE, CAPILLARY
GLUCOSE-CAPILLARY: 128 mg/dL — AB (ref 70–99)
Glucose-Capillary: 138 mg/dL — ABNORMAL HIGH (ref 70–99)
Glucose-Capillary: 168 mg/dL — ABNORMAL HIGH (ref 70–99)
Glucose-Capillary: 145 mg/dL — ABNORMAL HIGH (ref 70–99)

## 2015-01-18 MED ORDER — SODIUM CHLORIDE 0.9 % IV BOLUS (SEPSIS)
250.0000 mL | Freq: Once | INTRAVENOUS | Status: AC
  Administered 2015-01-18: 250 mL via INTRAVENOUS

## 2015-01-18 MED ORDER — POLYSACCHARIDE IRON COMPLEX 150 MG PO CAPS
150.0000 mg | ORAL_CAPSULE | Freq: Two times a day (BID) | ORAL | Status: DC
  Administered 2015-01-18 – 2015-01-20 (×5): 150 mg via ORAL
  Filled 2015-01-18 (×6): qty 1

## 2015-01-18 NOTE — Progress Notes (Signed)
  01/18/2015 2 Days Post-Op Procedure(s) (LRB): INTRAMEDULLARY (IM) RETROGRADE FEMORAL NAILING (Right) Patient reports pain as moderate.   Patient seen in rounds with Dr. Wynelle Link. Doing a little better today but still in a lot of pain. Patient is having problems with pain in the knee, requiring pain medications They will be Touch Down Weight Bearing Only to the right leg Plan is to go Home after hospital stay.  Vital signs in last 24 hours: Temp:  [98.1 F (36.7 C)-99 F (37.2 C)] 98.4 F (36.9 C) (02/11 0639) Pulse Rate:  [55-88] 78 (02/11 0639) Resp:  [16-18] 16 (02/11 0639) BP: (109-144)/(37-73) 138/57 mmHg (02/11 0639) SpO2:  [95 %-100 %] 100 % (02/11 0639)  I&O's: I/O last 3 completed shifts: In: 5078.7 [P.O.:1660; I.V.:3418.7] Out: 3595 [HGDJM:4268]    Labs:  Recent Labs  01/16/15 1413 01/17/15 0558 01/18/15 0438  HGB 11.1* 9.0* 8.1*    Recent Labs  01/17/15 0558 01/18/15 0438  WBC 8.1 9.3  RBC 3.13* 2.80*  HCT 28.0* 25.2*  PLT 236 215    Recent Labs  01/17/15 1343 01/18/15 0438  NA 140 138  K 4.1 4.2  CL 107 107  CO2 26 23  BUN 18 16  CREATININE 0.59 0.50  GLUCOSE 129* 160*  CALCIUM 8.5 8.3*    Recent Labs  01/16/15 1413  INR 1.05     EXAM: General - Patient is Alert, Appropriate and Oriented Extremity - Neurovascular intact Sensation intact distally Dorsiflexion/Plantar flexion intact Dressing - dressing C/D/I Motor Function - intact, moving foot and toes well on exam.    Past Medical History  Diagnosis Date  . Diabetes mellitus without complication   . Arthritis   . Hypertension   . Heart murmur     Assessment/Plan: 2 Days Post-Op Procedure(s) (LRB): INTRAMEDULLARY (IM) RETROGRADE FEMORAL NAILING (Right) Principal Problem:   Closed fracture of right distal femur Active Problems:   Fracture of distal femur  Estimated body mass index is 42.06 kg/(m^2) as calculated from the following:   Height as of this encounter: 5'  2" (1.575 m).   Weight as of this encounter: 104.327 kg (230 lb). Up with therapy Discharge home with home health as long as she does well  DVT Prophylaxis - Lovenox TDWB ONLY to the right leg Experienced some dizziness yesterday but no symptoms today.  Wants to avoid blood if possible.  Will see how she does today with therapy.  Call is develops symptoms.  Iron supplement  [name], PA-C Orthopaedic Surgery 01/18/2015, 8:19 AM

## 2015-01-18 NOTE — Progress Notes (Signed)
LATE ENTRY NOTE Date of Service of Visit - 01/17/2015 Wed.  Subjective: 1 Days Post-Op Procedure(s) (LRB): INTRAMEDULLARY (IM) RETROGRADE FEMORAL NAILING (Right) Patient reports pain as moderate and severe.   Patient seen in rounds with Dr. Wynelle Link. Patient is having problems with pain in the leg, requiring pain medications We will start therapy today.  Plan is to go Home after hospital stay.  Objective: Vital signs in last 24 hours: Temp:  [98.1 F (36.7 C)-99 F (37.2 C)] 98.4 F (36.9 C) (02/11 0639) Pulse Rate:  [55-88] 78 (02/11 0639) Resp:  [16-18] 16 (02/11 0639) BP: (109-144)/(37-73) 138/57 mmHg (02/11 0639) SpO2:  [95 %-100 %] 100 % (02/11 0639)  Intake/Output from previous day: 02/10 0701 - 02/11 0700 In: 2538.7 [P.O.:940; I.V.:1598.7] Out: 1400 [Urine:1400]  Recent Labs  01/16/15 1413 01/17/15 0558   HGB 11.1* 9.0*     Recent Labs  01/17/15 0558   WBC 8.1   RBC 3.13*   HCT 28.0*   PLT 236     Recent Labs  01/17/15 1343   NA 140   K 4.1   CL 107   CO2 26   BUN 18   CREATININE 0.59   GLUCOSE 129*   CALCIUM 8.5     Recent Labs  01/16/15 1413  INR 1.05    EXAM General - Patient is Alert and Appropriate Extremity - Neurovascular intact Sensation intact distally Dorsiflexion/Plantar flexion intact Dressing - dressing C/D/I Motor Function - intact, moving foot and toes well on exam.   Past Medical History  Diagnosis Date  . Diabetes mellitus without complication   . Arthritis   . Hypertension   . Heart murmur     Assessment/Plan: 1 Days Post-Op Procedure(s) (LRB): INTRAMEDULLARY (IM) RETROGRADE FEMORAL NAILING (Right) Principal Problem:   Closed fracture of right distal femur Active Problems:   Fracture of distal femur  Estimated body mass index is 42.06 kg/(m^2) as calculated from the following:   Height as of this encounter: 5\' 2"  (1.575 m).   Weight as of this encounter: 104.327 kg (230 lb). Advance diet Up with  therapy Discharge home with home health as long as she does well  DVT Prophylaxis - Lovenox TDWB ONLY to the right leg D/C O2 and Pulse OX and try on Room Air  [name], PA-C Orthopaedic Surgery 01/18/2015, 8:15 AM

## 2015-01-18 NOTE — Progress Notes (Signed)
Physical Therapy Treatment Patient Details Name: Kelly [name] MRN: [id number] DOB: [birth year] Today's Date: 01/18/2015    History of Present Illness Pt is a 69 year old female s/p retrograde intramedullary nailing of right distal femur fracture due to fall in cafeteria also with right eye swelling and ecchymosis    PT Comments    Pt still requiring at least mod assist for mobility and limited mobility to transfer due to difficulty maintaining TDWB status.  Continue to recommend SNF however plan is for home, equipment recommendations below.  Follow Up Recommendations  SNF;Supervision/Assistance - 24 hour     Equipment Recommendations  Wheelchair (measurements PT);Hospital bed;Other (comment);Rolling walker with 5" wheels (elevating leg rests for w/c, drop arm BSC, RW with L platform)    Recommendations for Other Services       Precautions / Restrictions Precautions Precautions: Fall Restrictions Weight Bearing Restrictions: Yes RLE Weight Bearing: Touchdown weight bearing    Mobility  Bed Mobility Overal bed mobility: Needs Assistance;+2 for physical assistance Bed Mobility: Supine to Sit     Supine to sit: Mod assist;HOB elevated;+2 for safety/equipment     General bed mobility comments: assist for R LE and scooting to EOB upon sitting, required elevated HOB and trapeze for upper body assist  Transfers Overall transfer level: Needs assistance Equipment used: Rolling walker (2 wheeled) Transfers: Sit to/from Stand Sit to Stand: Mod assist;+2 physical assistance;From elevated surface         General transfer comment: verbal cues for UE and LE positioning, pt requesting family to pull her upright, educated in safe technique, difficulty with TDWB even with platform RW so limited ambulation and had pt pivot bringing recliner behind her to sit  Ambulation/Gait Ambulation/Gait assistance:  (deferred as pt not able to maintain TDWB well)                Stairs            Wheelchair Mobility    Modified Rankin (Stroke Patients Only)       Balance                                    Cognition Arousal/Alertness: Awake/alert Behavior During Therapy: Anxious Overall Cognitive Status: Within Functional Limits for tasks assessed                      Exercises      General Comments        Pertinent Vitals/Pain Pain Assessment: 0-10 Pain Score: 4  Pain Location: R LE Pain Descriptors / Indicators: Sore Pain Intervention(s): Limited activity within patient's tolerance;Monitored during session;Premedicated before session;Repositioned    Home Living                      Prior Function            PT Goals (current goals can now be found in the care plan section) Progress towards PT goals: Progressing toward goals    Frequency  Min 5X/week    PT Plan Current plan remains appropriate    Co-evaluation             End of Session Equipment Utilized During Treatment: Gait belt Activity Tolerance: Patient limited by pain Patient left: with call bell/phone within reach;with family/visitor present;in chair     Time: 1340-1400 PT Time Calculation (min) (ACUTE ONLY): 20 min  Charges:  $  Therapeutic Activity: 8-22 mins                    G Codes:      Teliah Buffalo,KATHrine E 02-12-2015, 4:15 PM Carmelia Bake, PT, DPT 02/12/2015 Pager: [phone]

## 2015-01-18 NOTE — Clinical Documentation Improvement (Signed)
01/18/15  The impression of the anesthesia note 01/16/15 state pt with anemia The H/H on day of surgery =11.1/35.5 Post Op H/H=8.1/25.2 and pt on NS IVF  Based on the information above can the anemia be further specified as:   Possible Clinical Conditions?    Expected Acute Blood Loss Anemia  Acute Blood Loss Anemia  Acute on chronic blood loss anemia  Chronic blood loss anemia  Precipitous drop in Hematocrit  Other Condition________________  Cannot Clinically Determine  Risk Factors: (recent surgery, pre op anemia, EBL in OR)  Supporting Information:  Signs and Symptoms (unable to ambulate, weakness, dizziness, unable to participate in care)  Diagnostics: Component      Hemoglobin HCT  Latest Ref Rng      12.0 - 15.0 g/dL 36.0 - 46.0 %  01/16/2015     2:13 PM 11.1 (L) 35.5 (L)   Component      Hemoglobin HCT  Latest Ref Rng      12.0 - 15.0 g/dL 36.0 - 46.0 %  01/17/2015     5:58 AM 9.0 (L) 28.0 (L)   Component      Hemoglobin HCT  Latest Ref Rng      12.0 - 15.0 g/dL 36.0 - 46.0 %  01/18/2015      8.1 (L) 25.2 (L)   Treatments: 0.9 %  sodium chloride infusion      Thank You, Heloise Beecham ,RN Clinical Documentation Specialist:  [facility] [employer]

## 2015-01-18 NOTE — Progress Notes (Signed)
CSW met with pt / daughter this am. Pt requesting to return home following hospital d/c. Daughter reports pt will have 24/7 support at home. RNCM is assisting with d/c planning at this time.   [name] LCSW [phone]

## 2015-01-18 NOTE — Progress Notes (Addendum)
Physical Therapy Treatment Note  ** Please note if d/c home, recommend ambulance transportation.    01/18/15 1617  PT Visit Information  Last PT Received On 01/18/15  Assistance Needed +2  History of Present Illness Pt is a 69 year old female s/p retrograde intramedullary nailing of right distal femur fracture due to fall in cafeteria also with right eye swelling and ecchymosis  PT Time Calculation  PT Start Time (ACUTE ONLY) 1541  PT Stop Time (ACUTE ONLY) 1603  PT Time Calculation (min) (ACUTE ONLY) 22 min  Subjective Data  Subjective Pt assisted with lateral scoots from recliner to [facility] to bed.  Will need to practice w/c transfers and propulsion prior to d/c home.  Precautions  Precautions Fall  Restrictions  Weight Bearing Restrictions Yes  RLE Weight Bearing TWB  Pain Assessment  Pain Assessment 0-10  Pain Score 5  Pain Location R LE  Pain Descriptors / Indicators Aching;Sore  Pain Intervention(s) Limited activity within patient's tolerance;Ice applied;Repositioned;Monitored during session;Premedicated before session  Cognition  Arousal/Alertness Awake/alert  Behavior During Therapy Anxious  Overall Cognitive Status Within Functional Limits for tasks assessed  Bed Mobility  Overal bed mobility Needs Assistance;+ 2 for safety/equipment  Bed Mobility Sit to Supine  Sit to supine Mod assist  General bed mobility comments assist for BIL LEs onto bed, required repositioning bed and use of trapeze to straighten in supine  Transfers  Overall transfer level Needs assistance  Transfers Lateral/Scoot Transfers  Lateral/Scoot Transfers Mod assist;+2 safety/equipment  General transfer comment verbal cues for positioning lateral scoot recliner to [facility] to bed toward left side, therapist assisted with keeping foot behind pt's R LE for support and decrease WBing, assist required for L UE to pull up and over, likely easier time next visit when arranging BSC and bed at 90* angle with armrest  available for UE assist  PT - End of Session  Activity Tolerance Patient tolerated treatment well  Patient left in bed;with call bell/phone within reach;with family/visitor present  PT - Assessment/Plan  PT Plan Current plan remains appropriate  PT Frequency (ACUTE ONLY) Min 5X/week  Follow Up Recommendations SNF;Supervision/Assistance - 24 hour  PT equipment Wheelchair (measurements PT);Hospital bed;Other (comment);Rolling walker with 5" wheels (elevating leg rests for w/c, drop arm BSC, RW with L platfor)  PT Goal Progression  Progress towards PT goals Progressing toward goals  PT General Charges  $$ ACUTE PT VISIT 1 Procedure  PT Treatments  $Therapeutic Activity 8-22 mins   Carmelia Bake, PT, DPT 01/18/2015 Pager: [phone]

## 2015-01-18 NOTE — Evaluation (Signed)
Occupational Therapy Evaluation Patient Details Name: Kelly [name] MRN: [id number] DOB: 08/05/1946 Today's Date: 01/18/2015    History of Present Illness Pt is a 69 year old female s/p retrograde intramedullary nailing of right distal femur fracture due to fall in cafeteria also with right eye swelling and ecchymosis   Clinical Impression   Pt was admitted for the above.  She sat EOB with OT today (and was able to stand with PT yesterday).  Pt has TDWB precautions on R.  Pt was independent with adls prior to this fall/sx.  She has daughter visiting and husband to assist at home.  Focus will be on mobility related to ADLS so family can safely assist her at home    Follow Up Recommendations  Home health OT (if pt progresses where family can safely manage)    Equipment Recommendations  3 in 1 bedside comode (if she still doesn't have this)    Recommendations for Other Services       Precautions / Restrictions Precautions Precautions: Fall Restrictions Weight Bearing Restrictions: Yes RLE Weight Bearing: Touchdown weight bearing      Mobility Bed Mobility         Supine to sit: Min assist;HOB elevated (using bedrails, extra time) Sit to supine: Mod assist;HOB elevated (using bedrails)   General bed mobility comments: assist for RLE to edge of bed and bil LEs back to bed  Transfers                 General transfer comment: not attempted    Balance                                            ADL Overall ADL's : Needs assistance/impaired     Grooming: Oral care;Set up;Bed level   Upper Body Bathing: Minimal assitance;Bed level   Lower Body Bathing: Maximal assistance;Bed level;+2 for physical assistance (HOB up)   Upper Body Dressing : Minimal assistance;Bed level   Lower Body Dressing: Total assistance;+2 for physical assistance;Bed level                 General ADL Comments: Pt sat EOB for ADL assessment:  Could not get  bed to lowest position--NT will call maintenance.  Pt was uncomfortable sitting EOB.  Pt is familiar with AE from hip sx.  She is unable to lift RLE at this time to use AE--family will assist as needed:  will focus on mobility related to ADLs.  Pt unable to bridge completely on L side to straighten pad     Vision     Perception     Praxis      Pertinent Vitals/Pain Pain Score: 5  Pain Location: RLE Pain Descriptors / Indicators: Sore Pain Intervention(s): Limited activity within patient's tolerance;Monitored during session;Premedicated before session     Hand Dominance     Extremity/Trunk Assessment Upper Extremity Assessment Upper Extremity Assessment:  (bil UEs sore from fall, especially L wrist and shoulders)           Communication Communication Communication: No difficulties   Cognition Arousal/Alertness: Awake/alert Behavior During Therapy: Anxious Overall Cognitive Status: Within Functional Limits for tasks assessed                     General Comments       Exercises       Shoulder Instructions  Home Living Family/patient expects to be discharged to:: Unsure                                 Additional Comments: pt has reacher from hip sx 18 months ago      Prior Functioning/Environment Level of Independence: Independent             OT Diagnosis: Generalized weakness;Acute pain   OT Problem List: Decreased strength;Decreased activity tolerance;Pain;Decreased knowledge of use of DME or AE   OT Treatment/Interventions: Self-care/ADL training;DME and/or AE instruction;Patient/family education;Therapeutic activities    OT Goals(Current goals can be found in the care plan section) Acute Rehab OT Goals Patient Stated Goal: get back to being independent OT Goal Formulation: With patient Time For Goal Achievement: 01/25/15 Potential to Achieve Goals: Good ADL Goals Pt Will Transfer to Toilet: with min assist;with +2  assist;bedside commode;stand pivot transfer Additional ADL Goal #1: pt will stand with min A x 2 and maintain with min A for 2 minutes for adls.  OT Frequency: Min 2X/week   Barriers to D/C:            Co-evaluation              End of Session    Activity Tolerance: Patient limited by fatigue Patient left: in bed;with call bell/phone within reach;with family/visitor present   Time: 0732-0753 OT Time Calculation (min): 21 min Charges:  OT General Charges $OT Visit: 1 Procedure OT Evaluation $Initial OT Evaluation Tier I: 1 Procedure G-Codes:    Kelly [name] February 05, 2015, 8:17 AM  Kelly [name], OTR/L [phone] 05-Feb-2015

## 2015-01-19 LAB — GLUCOSE, CAPILLARY
GLUCOSE-CAPILLARY: 100 mg/dL — AB (ref 70–99)
GLUCOSE-CAPILLARY: 127 mg/dL — AB (ref 70–99)
Glucose-Capillary: 120 mg/dL — ABNORMAL HIGH (ref 70–99)
Glucose-Capillary: 161 mg/dL — ABNORMAL HIGH (ref 70–99)

## 2015-01-19 LAB — CBC
HCT: 25.4 % — ABNORMAL LOW (ref 36.0–46.0)
Hemoglobin: 8 g/dL — ABNORMAL LOW (ref 12.0–15.0)
MCH: 28.3 pg (ref 26.0–34.0)
MCHC: 31.5 g/dL (ref 30.0–36.0)
MCV: 89.8 fL (ref 78.0–100.0)
Platelets: 241 10*3/uL (ref 150–400)
RBC: 2.83 MIL/uL — ABNORMAL LOW (ref 3.87–5.11)
RDW: 15.6 % — ABNORMAL HIGH (ref 11.5–15.5)
WBC: 9.9 10*3/uL (ref 4.0–10.5)

## 2015-01-19 MED ORDER — POLYSACCHARIDE IRON COMPLEX 150 MG PO CAPS: 150.0000 mg | ORAL_CAPSULE | Freq: Two times a day (BID) | ORAL | Status: DC

## 2015-01-19 MED ORDER — CYCLOBENZAPRINE HCL 5 MG PO TABS: 5.0000 mg | ORAL_TABLET | Freq: Three times a day (TID) | ORAL | Status: DC | PRN

## 2015-01-19 MED ORDER — TRAMADOL HCL 50 MG PO TABS: 50.0000 mg | ORAL_TABLET | Freq: Four times a day (QID) | ORAL | Status: DC | PRN

## 2015-01-19 MED ORDER — OXYCODONE HCL 5 MG PO TABS: 5.0000 mg | ORAL_TABLET | ORAL | Status: DC | PRN

## 2015-01-19 MED ORDER — ASCRIPTIN 325 MG PO TABS: 325.0000 mg | ORAL_TABLET | Freq: Two times a day (BID) | ORAL | Status: DC

## 2015-01-19 NOTE — Discharge Instructions (Signed)
Fall Prevention and Home Safety Falls cause injuries and can affect all age groups. It is possible to use preventive measures to significantly decrease the likelihood of falls. There are many simple measures which can make your home safer and prevent falls. OUTDOORS  Repair cracks and edges of walkways and driveways.  Remove high doorway thresholds.  Trim shrubbery on the main path into your home.  Have good outside lighting.  Clear walkways of tools, rocks, debris, and clutter.  Check that handrails are not broken and are securely fastened. Both sides of steps should have handrails.  Have leaves, snow, and ice cleared regularly.  Use sand or salt on walkways during winter months.  In the garage, clean up grease or oil spills. BATHROOM  Install night lights.  Install grab bars by the toilet and in the tub and shower.  Use non-skid mats or decals in the tub or shower.  Place a plastic non-slip stool in the shower to sit on, if needed.  Keep floors dry and clean up all water on the floor immediately.  Remove soap buildup in the tub or shower on a regular basis.  Secure bath mats with non-slip, double-sided rug tape.  Remove throw rugs and tripping hazards from the floors. BEDROOMS  Install night lights.  Make sure a bedside light is easy to reach.  Do not use oversized bedding.  Keep a telephone by your bedside.  Have a firm chair with side arms to use for getting dressed.  Remove throw rugs and tripping hazards from the floor. KITCHEN  Keep handles on pots and pans turned toward the center of the stove. Use back burners when possible.  Clean up spills quickly and allow time for drying.  Avoid walking on wet floors.  Avoid hot utensils and knives.  Position shelves so they are not too high or low.  Place commonly used objects within easy reach.  If necessary, use a sturdy step stool with a grab bar when reaching.  Keep electrical cables out of the  way.  Do not use floor polish or wax that makes floors slippery. If you must use wax, use non-skid floor wax.  Remove throw rugs and tripping hazards from the floor. STAIRWAYS  Never leave objects on stairs.  Place handrails on both sides of stairways and use them. Fix any loose handrails. Make sure handrails on both sides of the stairways are as long as the stairs.  Check carpeting to make sure it is firmly attached along stairs. Make repairs to worn or loose carpet promptly.  Avoid placing throw rugs at the top or bottom of stairways, or properly secure the rug with carpet tape to prevent slippage. Get rid of throw rugs, if possible.  Have an electrician put in a light switch at the top and bottom of the stairs. OTHER FALL PREVENTION TIPS  Wear low-heel or rubber-soled shoes that are supportive and fit well. Wear closed toe shoes.  When using a stepladder, make sure it is fully opened and both spreaders are firmly locked. Do not climb a closed stepladder.  Add color or contrast paint or tape to grab bars and handrails in your home. Place contrasting color strips on first and last steps.  Learn and use mobility aids as needed. Install an electrical emergency response system.  Turn on lights to avoid dark areas. Replace light bulbs that burn out immediately. Get light switches that glow.  Arrange furniture to create clear pathways. Keep furniture in the same place.  Firmly attach carpet with non-skid or double-sided tape.  Eliminate uneven floor surfaces.  Select a carpet pattern that does not visually hide the edge of steps.  Be aware of all pets. OTHER HOME SAFETY TIPS  Set the water temperature for 120 F (48.8 C).  Keep emergency numbers on or near the telephone.  Keep smoke detectors on every level of the home and near sleeping areas. Document Released: 11/14/2002 Document Revised: 05/25/2012 Document Reviewed: 02/13/2012 [facility] Patient Information 2015  [place], [place]. This information is not intended to replace advice given to you by your health care provider. Make sure you discuss any questions you have with your health care provider.  Femur Fracture A femur fracture is a complete or incomplete break in the thighbone (femur). This is a serious injury, but is uncommon in sports. Usually the ankle, lower leg, or knee will become injured before the thighbone does.  SYMPTOMS   Severe pain in the thigh, at the time of injury.  Tenderness and inflammation in the thigh.  Bleeding and bruising in the thigh.  Inability to bear weight on the injured leg.  Visible deformity, if the fracture is complete and bone fragments separate enough to distort the leg shape.  Numbness and coldness in the leg and foot, beyond the fracture site, if blood supply is impaired. CAUSES   A fracture results when the force applied to a bone is greater that the bone can withstand. Thighbone fractures often result from a direct hit (trauma).  Indirect stress, caused by twisting or violent muscle contraction. RISK INCREASES WITH:   Contact sports (i.e. football, soccer, hockey), motor sports, and track and field events.  Previous or current bone problems (i.e. osteoporosis, tumors).  Metabolism disorders or hormone problems.  Nutrition deficiency or disorder (i.e. anorexia and bulimia).  Poor strength and flexibility. PREVENTION   Warm up and stretch properly before activity.  Maintain physical fitness:  Muscle strength.  Endurance and flexibility.  Cardiovascular fitness.  Wear proper protective equipment (i.e. thigh pads for football or hockey). PROGNOSIS  This condition can often be cured with proper treatment, though it may take 6 to 8 weeks to heal.  RELATED COMPLICATIONS   Low blood volume (hypovolemic) shock, due to blood loss in the thigh.  Failure of bone to heal (nonunion).  Bone heals in a poor position (malunion).  Increased  pressure inside the leg(compartment syndrome), due to injury that disrupts blood supply to the leg and foot and injures the nerves and muscles of the leg and foot (uncommon).  Shortening of the injured bones.  Increased chance of repeated leg injury.  Stiff hip or knee.  Hindrance of normal bone growth in children.  Risks of surgery: infection, bleeding, injury to nerves (numbness, weakness, paralysis), need for further surgery.  Infection of open fractures (skin broken over fracture site).  Bone forming within the muscle (myositis ossificans).  Longer healing time, if activity is resumed too soon. TREATMENT  Treatment first involves the use of ice and medicine to reduce pain and inflammation. Treatment of thighbone fractures often requires surgery, to allow the bone to heal in proper alignment, and to reduce the risk of possible complications. Surgery often involves placing a metal rod down the center of the bone, or fixing plates and screws over the fracture line. Use of a cast is not common, because the cast would need to involve the stomach, low back, pelvis, and extend to the foot. For adults, traction (applying pressure using a device) is  not often advised, due to the need for prolonged bed rest (6 to 8 weeks). In certain cases, bone growth stimulators may be advised. After the bone heals (with or without surgery), stretching and strengthening exercise is needed. Exercises may be done at home or with a therapist. The rod, plate, and screws from surgery are only removed if they cause further discomfort.  MEDICATION   If pain medicine is needed, nonsteroidal anti-inflammatory medicines (aspirin and ibuprofen), or other minor pain relievers (acetaminophen), are often advised.  Do not take pain medicine for 7 days before surgery.  Stronger pain relievers may be prescribed by your caregiver. Use only as directed and only as much as you need. SEEK MEDICAL CARE IF:   Symptoms get worse or  do not improve in 2 weeks, despite treatment.  The following occur after restraint or surgery. (Report any of these signs immediately):  Swelling above or below the fracture site.  Severe, persistent pain.  Blue or gray skin below the fracture site, especially under the toenails. Numbness or loss of feeling below the fracture site.  New, unexplained symptoms develop. (Drugs used in treatment may produce side effects.) Document Released: 11/24/2005 Document Revised: 02/16/2012 Document Reviewed: 03/08/2009 [facility] Patient Information 2015 [place], [place]. This information is not intended to replace advice given to you by your health care provider. Make sure you discuss any questions you have with your health care provider.  Head Injury You have a head injury. Headaches and throwing up (vomiting) are common after a head injury. It should be easy to wake up from sleeping. Sometimes you must stay in the hospital. Most problems happen within the first 24 hours. Side effects may occur up to 7-10 days after the injury.  WHAT ARE THE TYPES OF HEAD INJURIES? Head injuries can be as minor as a bump. Some head injuries can be more severe. More severe head injuries include:  A jarring injury to the brain (concussion).  A bruise of the brain (contusion). This mean there is bleeding in the brain that can cause swelling.  A cracked skull (skull fracture).  Bleeding in the brain that collects, clots, and forms a bump (hematoma). WHEN SHOULD I GET HELP RIGHT AWAY?   You are confused or sleepy.  You cannot be woken up.  You feel sick to your stomach (nauseous) or keep throwing up (vomiting).  Your dizziness or unsteadiness is getting worse.  You have very bad, lasting headaches that are not helped by medicine. Take medicines only as told by your doctor.  You cannot use your arms or legs like normal.  You cannot walk.  You notice changes in the black spots in the center of the colored part of  your eye (pupil).  You have clear or bloody fluid coming from your nose or ears.  You have trouble seeing. During the next 24 hours after the injury, you must stay with someone who can watch you. This person should get help right away (call 911 in the [place] if you start to shake and are not able to control it (have seizures), you pass out, or you are unable to wake up. HOW CAN I PREVENT A HEAD INJURY IN THE FUTURE?  Wear seat belts.  Wear a helmet while bike riding and playing sports like football.  Stay away from dangerous activities around the house. WHEN CAN I RETURN TO NORMAL ACTIVITIES AND ATHLETICS? See your doctor before doing these activities. You should not do normal activities or play contact sports  until 1 week after the following symptoms have stopped:  Headache that does not go away.  Dizziness.  Poor attention.  Confusion.  Memory problems.  Sickness to your stomach or throwing up.  Tiredness.  Fussiness.  Bothered by bright lights or loud noises.  Anxiousness or depression.  Restless sleep. MAKE SURE YOU:   Understand these instructions.  Will watch your condition.  Will get help right away if you are not doing well or get worse. Document Released: 11/06/2008 Document Revised: 04/10/2014 Document Reviewed: 08/01/2013 [facility] Patient Information 2015 [place], [place]. This information is not intended to replace advice given to you by your health care provider. Make sure you discuss any questions you have with your health care provider.   Pick up stool softner and laxative for home. Do not submerge incision under water. May shower. Continue to use ice for pain and swelling from surgery.  Take a full dose 325 mg Aspirin twice a day for three weeks and then reduce back to a baby 81 mg Aspirin daily.  Postoperative Constipation Protocol  Constipation - defined medically as fewer than three stools per week and severe constipation as less than one  stool per week.  One of the most common issues patients have following surgery is constipation.  Even if you have a regular bowel pattern at home, your normal regimen is likely to be disrupted due to multiple reasons following surgery.  Combination of anesthesia, postoperative narcotics, change in appetite and fluid intake all can affect your bowels.  In order to avoid complications following surgery, here are some recommendations in order to help you during your recovery period.  Colace (docusate) - Pick up an over-the-counter form of Colace or another stool softener and take twice a day as long as you are requiring postoperative pain medications.  Take with a full glass of water daily.  If you experience loose stools or diarrhea, hold the colace until you stool forms back up.  If your symptoms do not get better within 1 week or if they get worse, check with your doctor.  Dulcolax (bisacodyl) - Pick up over-the-counter and take as directed by the product packaging as needed to assist with the movement of your bowels.  Take with a full glass of water.  Use this product as needed if not relieved by Colace only.   MiraLax (polyethylene glycol) - Pick up over-the-counter to have on hand.  MiraLax is a solution that will increase the amount of water in your bowels to assist with bowel movements.  Take as directed and can mix with a glass of water, juice, soda, coffee, or tea.  Take if you go more than two days without a movement. Do not use MiraLax more than once per day. Call your doctor if you are still constipated or irregular after using this medication for 7 days in a row.  If you continue to have problems with postoperative constipation, please contact the office for further assistance and recommendations.  If you experience "the worst abdominal pain ever" or develop nausea or vomiting, please contact the office immediatly for further recommendations for treatment.

## 2015-01-19 NOTE — Progress Notes (Signed)
Physical Therapy Treatment Patient Details Name: Kelly [name] MRN: [id number] DOB: [birth year] Today's Date: 01/19/2015    History of Present Illness Pt is a 69 year old female s/p retrograde intramedullary nailing of right distal femur fracture due to fall in cafeteria also with right eye swelling and ecchymosis    PT Comments    Pt assisted with w/c transfers and propulsion.  Pt continues to require cues and would benefit from further PT prior to d/c home.  Pt refuses SNF at this time.  Follow Up Recommendations  SNF;Supervision/Assistance - 24 hour     Equipment Recommendations  Wheelchair (measurements PT);Hospital bed;Other (comment);Rolling walker with 5" wheels ((elevating leg rests for w/c, drop arm BSC, RW with L platform)    Recommendations for Other Services       Precautions / Restrictions Precautions Precautions: Fall Restrictions Weight Bearing Restrictions: Yes RLE Weight Bearing: Touchdown weight bearing    Mobility  Bed Mobility Overal bed mobility: Needs Assistance;+ 2 for safety/equipment Bed Mobility: Supine to Sit;Sit to Supine     Supine to sit: Min guard;HOB elevated Sit to supine: Mod assist   General bed mobility comments: assist for BIL LEs onto bed, required repositioning bed and use of trapeze and rails for self assist  Transfers Overall transfer level: Needs assistance Equipment used: Rolling walker (2 wheeled) Transfers: Lateral/Scoot Transfers          Lateral/Scoot Transfers: Min assist General transfer comment: educated pt and son about safe bed to w/c transfers, max verbal cues for keeping TDWB as well as safe technique, increased encouragement to transfer without assist from family (pt tends to ask son or spouse to come help her)  Ambulation/Gait                 Hotel manager mobility: Yes Wheelchair propulsion: Both upper extremities Wheelchair parts:  Needs assistance Distance: 90 feet Wheelchair Assistance Details (indicate cue type and reason): pt and son educated on w/c parts, safety, elevating leg rests, pt with difficulty turning and in tight spaces occasionally requiring assist  Modified Rankin (Stroke Patients Only)       Balance                                    Cognition Arousal/Alertness: Awake/alert Behavior During Therapy: WFL for tasks assessed/performed Overall Cognitive Status: Within Functional Limits for tasks assessed                      Exercises      General Comments        Pertinent Vitals/Pain Pain Assessment: 0-10 Pain Score: 3  Pain Location: RLE Pain Descriptors / Indicators: Sore;Aching Pain Intervention(s): Monitored during session;Limited activity within patient's tolerance;Premedicated before session;Repositioned;Ice applied    Home Living                      Prior Function            PT Goals (current goals can now be found in the care plan section) Progress towards PT goals: Progressing toward goals    Frequency  Min 5X/week    PT Plan Current plan remains appropriate    Co-evaluation             End of Session   Activity Tolerance: Patient  tolerated treatment well Patient left: in bed;with call bell/phone within reach;with family/visitor present     Time: 2947-6546 PT Time Calculation (min) (ACUTE ONLY): 34 min  Charges:  $Therapeutic Activity: 8-22 mins $Wheel Chair Management: 8-22 mins                    G Codes:      Marianne Golightly,KATHrine E 02-Feb-2015, 1:25 PM Carmelia Bake, PT, DPT 02-Feb-2015 Pager: [phone]

## 2015-01-19 NOTE — Plan of Care (Signed)
Problem: Acute Rehab OT Goals (only OT should resolve) Goal: Pt. Will Transfer To Toilet Outcome: Not Met (add Reason) Pt currently performing lateral scoot transfers Goal: OT Additional ADL Goal #1 Outcome: Not Met (add Reason) Pt currently not at sit to stand level. Performing scoot transfers.

## 2015-01-19 NOTE — Progress Notes (Addendum)
   Subjective: 3 Days Post-Op Procedure(s) (LRB): INTRAMEDULLARY (IM) RETROGRADE FEMORAL NAILING (Right) Patient reports pain as mild and moderate.   Patient seen in rounds with Dr. Wynelle Link. Family in room. Patient is having problems with pain in the knee, requiring pain medications They will be Touch Down Weight Bearing Only to the right leg Patient is possibly ready to go home later today.  Objective: Vital signs in last 24 hours: Temp:  [98 F (36.7 C)-99.3 F (37.4 C)] 98 F (36.7 C) (02/12 0630) Pulse Rate:  [78-90] 78 (02/12 0630) Resp:  [14-20] 20 (02/12 0630) BP: (122-165)/(50-68) 165/68 mmHg (02/12 0630) SpO2:  [95 %-100 %] 100 % (02/12 0630)  Intake/Output from previous day:  Intake/Output Summary (Last 24 hours) at 01/19/15 1327 Last data filed at 01/19/15 0900  Gross per 24 hour  Intake    580 ml  Output   1600 ml  Net  -1020 ml    Intake/Output this shift: Total I/O In: 240 [P.O.:240] Out: -   Labs:  Recent Labs  01/16/15 1413 01/17/15 0558 01/18/15 0438 01/19/15 0540  HGB 11.1* 9.0* 8.1* 8.0*    Recent Labs  01/18/15 0438 01/19/15 0540  WBC 9.3 9.9  RBC 2.80* 2.83*  HCT 25.2* 25.4*  PLT 215 241    Recent Labs  01/17/15 1343 01/18/15 0438  NA 140 138  K 4.1 4.2  CL 107 107  CO2 26 23  BUN 18 16  CREATININE 0.59 0.50  GLUCOSE 129* 160*  CALCIUM 8.5 8.3*    Recent Labs  01/16/15 1413  INR 1.05    EXAM: General - Patient is Alert, Appropriate and Oriented Extremity - Neurovascular intact Sensation intact distally Dorsiflexion/Plantar flexion intact Incision - clean, dry, no drainage, stales intect Motor Function - intact, moving foot and toes well on exam.   Assessment/Plan: 3 Days Post-Op Procedure(s) (LRB): INTRAMEDULLARY (IM) RETROGRADE FEMORAL NAILING (Right) Procedure(s) (LRB): INTRAMEDULLARY (IM) RETROGRADE FEMORAL NAILING (Right) Past Medical History  Diagnosis Date  . Diabetes mellitus without complication     . Arthritis   . Hypertension   . Heart murmur    Principal Problem:   Closed fracture of right distal femur Active Problems:   Fracture of distal femur  Estimated body mass index is 42.06 kg/(m^2) as calculated from the following:   Height as of this encounter: 5\' 2"  (1.575 m).   Weight as of this encounter: 104.327 kg (230 lb). Up with therapy Discharge home with home health if does well Diet - Cardiac diet and Diabetic diet Follow up - in 2 weeks Activity - TDWB ONLY to the right leg Disposition - Home  Condition Upon Discharge - Pending at this time D/C Meds - See DC Summary DVT Prophylaxis - Aspirin at time of discharge  [name], PA-C Orthopaedic Surgery 01/19/2015, 1:27 PM   Addendum - Doing better with therapy but needs equipment before she can be safe at home.  Scheduled to be delivered tomorrow.  Will hold discharge and then allow home in the morning.  RXs have been completed and ready for discharge tomorrow if all arrangements are completed. [name], PA-C 01/19/2015 3:58 PM

## 2015-01-19 NOTE — Progress Notes (Signed)
OT Cancellation Note  Patient Details Name: Kelly [name] MRN: [id number] DOB: 1946-01-17   Cancelled Treatment:    Reason Eval/Treat Not Completed: Other (comment) Spoke with pt and son about practicing up to [place] but pt stating she has already been up to [facility] earlier today with PT and she doesn't want to practice unless she actually needs to use the commode. She states it takes a lot out of her with each transfer. Explained that OT would check back later time and aim to practice toilet transfer again/answer any other ADL questions and pt agreeable.   [place], [place] 01/19/2015, 1:11 PM

## 2015-01-19 NOTE — Discharge Summary (Signed)
Physician Discharge Summary   Patient ID: Kelly [name] MRN: [id number] DOB/AGE: [id number] y.o.  Admit date: 01/16/2015 Discharge date: 01/20/2015  Primary Diagnosis:  Right distal femur fracture. Admission Diagnoses:  Past Medical History  Diagnosis Date  . Diabetes mellitus without complication   . Arthritis   . Hypertension   . Heart murmur    Discharge Diagnoses:   Principal Problem:   Closed fracture of right distal femur Active Problems:   Fracture of distal femur  Estimated body mass index is 42.06 kg/(m^2) as calculated from the following:   Height as of this encounter: '5\' 2"'  (1.575 m).   Weight as of this encounter: 104.327 kg (230 lb).  Procedure(s) (LRB): INTRAMEDULLARY (IM) RETROGRADE FEMORAL NAILING (Right)   Consults: None  HPI: [name] is a 68 year old female, who I previously did a total hip arthroplasty on, who had a fall earlier today landing on a flexed right knee with immediate pain and deformity above the knee. She was taken to the emergency room where evaluation revealed a displaced supracondylar distal femur fracture. She presents now for operative fixation.  Laboratory Data: Admission on 01/16/2015, Discharged on 01/20/2015  Component Date Value Ref Range Status  . Glucose-Capillary 01/16/2015 145* 70 - 99 mg/dL Final  . Sodium 01/16/2015 139  135 - 145 mmol/L Final  . Potassium 01/16/2015 4.4  3.5 - 5.1 mmol/L Final  . Chloride 01/16/2015 105  96 - 112 mmol/L Final  . CO2 01/16/2015 24  19 - 32 mmol/L Final  . Glucose, Bld 01/16/2015 149* 70 - 99 mg/dL Final  . BUN 01/16/2015 23  6 - 23 mg/dL Final  . Creatinine, Ser 01/16/2015 0.62  0.50 - 1.10 mg/dL Final  . Calcium 01/16/2015 9.2  8.4 - 10.5 mg/dL Final  . GFR calc non Af Amer 01/16/2015 >90  >90 mL/min Final  . GFR calc Af Amer 01/16/2015 >90  >90 mL/min Final   Comment: (NOTE) The eGFR has been calculated using the CKD EPI equation. This calculation has not been validated  in all clinical situations. eGFR's persistently <90 mL/min signify possible Chronic Kidney Disease.   . Anion gap 01/16/2015 10  5 - 15 Final  . WBC 01/16/2015 15.7* 4.0 - 10.5 K/uL Final  . RBC 01/16/2015 3.94  3.87 - 5.11 MIL/uL Final  . Hemoglobin 01/16/2015 11.1* 12.0 - 15.0 g/dL Final  . HCT 01/16/2015 35.5* 36.0 - 46.0 % Final  . MCV 01/16/2015 90.1  78.0 - 100.0 fL Final  . MCH 01/16/2015 28.2  26.0 - 34.0 pg Final  . MCHC 01/16/2015 31.3  30.0 - 36.0 g/dL Final  . RDW 01/16/2015 15.5  11.5 - 15.5 % Final  . Platelets 01/16/2015 286  150 - 400 K/uL Final  . Neutrophils Relative % 01/16/2015 83* 43 - 77 % Final  . Neutro Abs 01/16/2015 12.9* 1.7 - 7.7 K/uL Final  . Lymphocytes Relative 01/16/2015 11* 12 - 46 % Final  . Lymphs Abs 01/16/2015 1.7  0.7 - 4.0 K/uL Final  . Monocytes Relative 01/16/2015 6  3 - 12 % Final  . Monocytes Absolute 01/16/2015 1.0  0.1 - 1.0 K/uL Final  . Eosinophils Relative 01/16/2015 0  0 - 5 % Final  . Eosinophils Absolute 01/16/2015 0.1  0.0 - 0.7 K/uL Final  . Basophils Relative 01/16/2015 0  0 - 1 % Final  . Basophils Absolute 01/16/2015 0.0  0.0 - 0.1 K/uL Final  . Prothrombin Time 01/16/2015 13.8  11.6 -  15.2 seconds Final  . INR 01/16/2015 1.05  0.00 - 1.49 Final  . Glucose-Capillary 01/16/2015 111* 70 - 99 mg/dL Final  . Sodium 01/17/2015 141  135 - 145 mmol/L Final  . Potassium 01/17/2015 5.3* 3.5 - 5.1 mmol/L Final   Comment: DELTA CHECK NOTED SLIGHT HEMOLYSIS HEMOLYSIS AT THIS LEVEL MAY AFFECT RESULT   . Chloride 01/17/2015 107  96 - 112 mmol/L Final  . CO2 01/17/2015 26  19 - 32 mmol/L Final  . Glucose, Bld 01/17/2015 201* 70 - 99 mg/dL Final  . BUN 01/17/2015 15  6 - 23 mg/dL Final  . Creatinine, Ser 01/17/2015 0.55  0.50 - 1.10 mg/dL Final  . Calcium 01/17/2015 8.6  8.4 - 10.5 mg/dL Final  . GFR calc non Af Amer 01/17/2015 >90  >90 mL/min Final  . GFR calc Af Amer 01/17/2015 >90  >90 mL/min Final   Comment: (NOTE) The eGFR has  been calculated using the CKD EPI equation. This calculation has not been validated in all clinical situations. eGFR's persistently <90 mL/min signify possible Chronic Kidney Disease.   . Anion gap 01/17/2015 8  5 - 15 Final  . WBC 01/17/2015 8.1  4.0 - 10.5 K/uL Final  . RBC 01/17/2015 3.13* 3.87 - 5.11 MIL/uL Final  . Hemoglobin 01/17/2015 9.0* 12.0 - 15.0 g/dL Final   Comment: REPEATED TO VERIFY DELTA CHECK NOTED   . HCT 01/17/2015 28.0* 36.0 - 46.0 % Final  . MCV 01/17/2015 89.5  78.0 - 100.0 fL Final  . MCH 01/17/2015 28.8  26.0 - 34.0 pg Final  . MCHC 01/17/2015 32.1  30.0 - 36.0 g/dL Final  . RDW 01/17/2015 15.5  11.5 - 15.5 % Final  . Platelets 01/17/2015 236  150 - 400 K/uL Final  . Glucose-Capillary 01/16/2015 187* 70 - 99 mg/dL Final  . Glucose-Capillary 01/17/2015 156* 70 - 99 mg/dL Final  . Glucose-Capillary 01/17/2015 144* 70 - 99 mg/dL Final  . Sodium 01/17/2015 140  135 - 145 mmol/L Final  . Potassium 01/17/2015 4.1  3.5 - 5.1 mmol/L Final   Comment: DELTA CHECK NOTED REPEATED TO VERIFY   . Chloride 01/17/2015 107  96 - 112 mmol/L Final  . CO2 01/17/2015 26  19 - 32 mmol/L Final  . Glucose, Bld 01/17/2015 129* 70 - 99 mg/dL Final  . BUN 01/17/2015 18  6 - 23 mg/dL Final  . Creatinine, Ser 01/17/2015 0.59  0.50 - 1.10 mg/dL Final  . Calcium 01/17/2015 8.5  8.4 - 10.5 mg/dL Final  . GFR calc non Af Amer 01/17/2015 >90  >90 mL/min Final  . GFR calc Af Amer 01/17/2015 >90  >90 mL/min Final   Comment: (NOTE) The eGFR has been calculated using the CKD EPI equation. This calculation has not been validated in all clinical situations. eGFR's persistently <90 mL/min signify possible Chronic Kidney Disease.   . Anion gap 01/17/2015 7  5 - 15 Final  . Sodium 01/18/2015 138  135 - 145 mmol/L Final  . Potassium 01/18/2015 4.2  3.5 - 5.1 mmol/L Final  . Chloride 01/18/2015 107  96 - 112 mmol/L Final  . CO2 01/18/2015 23  19 - 32 mmol/L Final  . Glucose, Bld 01/18/2015  160* 70 - 99 mg/dL Final  . BUN 01/18/2015 16  6 - 23 mg/dL Final  . Creatinine, Ser 01/18/2015 0.50  0.50 - 1.10 mg/dL Final  . Calcium 01/18/2015 8.3* 8.4 - 10.5 mg/dL Final  . GFR calc non Af Amer 01/18/2015 >  90  >90 mL/min Final  . GFR calc Af Amer 01/18/2015 >90  >90 mL/min Final   Comment: (NOTE) The eGFR has been calculated using the CKD EPI equation. This calculation has not been validated in all clinical situations. eGFR's persistently <90 mL/min signify possible Chronic Kidney Disease.   . Anion gap 01/18/2015 8  5 - 15 Final  . WBC 01/18/2015 9.3  4.0 - 10.5 K/uL Final  . RBC 01/18/2015 2.80* 3.87 - 5.11 MIL/uL Final  . Hemoglobin 01/18/2015 8.1* 12.0 - 15.0 g/dL Final  . HCT 01/18/2015 25.2* 36.0 - 46.0 % Final  . MCV 01/18/2015 90.0  78.0 - 100.0 fL Final  . MCH 01/18/2015 28.9  26.0 - 34.0 pg Final  . MCHC 01/18/2015 32.1  30.0 - 36.0 g/dL Final  . RDW 01/18/2015 15.8* 11.5 - 15.5 % Final  . Platelets 01/18/2015 215  150 - 400 K/uL Final  . Glucose-Capillary 01/17/2015 130* 70 - 99 mg/dL Final  . Glucose-Capillary 01/17/2015 121* 70 - 99 mg/dL Final  . Glucose-Capillary 01/18/2015 138* 70 - 99 mg/dL Final  . Comment 1 01/18/2015 Notify RN   Final  . Comment 2 01/18/2015 Document in Chart   Final  . Glucose-Capillary 01/18/2015 128* 70 - 99 mg/dL Final  . WBC 01/19/2015 9.9  4.0 - 10.5 K/uL Final  . RBC 01/19/2015 2.83* 3.87 - 5.11 MIL/uL Final  . Hemoglobin 01/19/2015 8.0* 12.0 - 15.0 g/dL Final  . HCT 01/19/2015 25.4* 36.0 - 46.0 % Final  . MCV 01/19/2015 89.8  78.0 - 100.0 fL Final  . MCH 01/19/2015 28.3  26.0 - 34.0 pg Final  . MCHC 01/19/2015 31.5  30.0 - 36.0 g/dL Final  . RDW 01/19/2015 15.6* 11.5 - 15.5 % Final  . Platelets 01/19/2015 241  150 - 400 K/uL Final  . Glucose-Capillary 01/18/2015 168* 70 - 99 mg/dL Final  . Glucose-Capillary 01/18/2015 145* 70 - 99 mg/dL Final  . Glucose-Capillary 01/19/2015 120* 70 - 99 mg/dL Final  . Glucose-Capillary  01/19/2015 100* 70 - 99 mg/dL Final  . Glucose-Capillary 01/19/2015 161* 70 - 99 mg/dL Final  . Comment 1 01/19/2015 Notify RN   Final  . Comment 2 01/19/2015 Document in Chart   Final  . Glucose-Capillary 01/19/2015 127* 70 - 99 mg/dL Final  . Glucose-Capillary 01/20/2015 154* 70 - 99 mg/dL Final  . Glucose-Capillary 01/20/2015 163* 70 - 99 mg/dL Final     X-Rays:Dg Wrist Complete Left  01/16/2015   CLINICAL DATA:  Patient tripped on rug and fell  EXAM: LEFT WRIST - COMPLETE 3+ VIEW  COMPARISON:  None.  FINDINGS: Frontal, oblique, lateral, and ulnar deviation scaphoid images were obtained. Bones are osteoporotic. There is no acute fracture or dislocation. Joint spaces appear intact. No erosive change.  IMPRESSION: Bones osteoporotic. No fracture or dislocation. No appreciable arthropathy.   Electronically Signed   By: Lowella Grip III M.D.   On: 01/16/2015 13:54   Dg Knee 1-2 Views Right  01/16/2015   CLINICAL DATA:  Tripped on overload and fell.  Initial encounter  EXAM: RIGHT KNEE - 1-2 VIEW  COMPARISON:  None.  FINDINGS: Distal femoral diaphysis fracture with comminution and roughly 50% posterior and lateral displacement. There is apex posterior angulation. No indication of laceration to suggest open fracture.  There is mild degenerative spurring around the knee with no significant joint narrowing.  Prominent osteopenia.  IMPRESSION: 1. Displaced distal femoral diaphysis fracture. 2. Prominent osteopenia.   Electronically Signed  By: Monte Fantasia M.D.   On: 01/16/2015 13:52   Ct Head Wo Contrast  01/16/2015   CLINICAL DATA:  Patient hit head during fall  EXAM: CT HEAD WITHOUT CONTRAST  TECHNIQUE: Contiguous axial images were obtained from the base of the skull through the vertex without intravenous contrast.  COMPARISON:  None.  FINDINGS: The ventricles are normal in size and configuration. There is no intracranial mass, hemorrhage, extra-axial fluid collection, or midline shift. There is  mild small vessel disease in the centra semiovale bilaterally. Elsewhere gray-white compartments appear normal. No acute infarct is apparent. Bony calvarium appears intact. The mastoid air cells are clear. There is a large right frontal scalp hematoma. There is preseptal orbital edema on the right. No intraorbital lesion is appreciable on this study.  IMPRESSION: Large right frontal scalp and preseptal orbital hematoma. No fracture apparent. There is mild periventricular small vessel disease. No intracranial mass or hemorrhage. No acute infarct apparent.   Electronically Signed   By: Lowella Grip III M.D.   On: 01/16/2015 13:51   Dg C-arm 61-120 Min-no Report  01/16/2015   CLINICAL DATA: Fractured femur   C-ARM 61-120 MINUTES  Fluoroscopy was utilized by the requesting physician.  No radiographic  interpretation.    Dg Hip Unilat With Pelvis 2-3 Views Right  01/16/2015   CLINICAL DATA:  Right lower extremity pain secondary to a fall.  EXAM: RIGHT HIP (WITH PELVIS) 2-3 VIEWS  COMPARISON:  None.  FINDINGS: The right total hip prosthesis appears in good position. There is diffuse osteopenia. No acute abnormality of the right hip. Pelvic bones are intact. There is fairly severe degenerative disc and joint disease in the lower lumbar spine.  IMPRESSION: No acute abnormality of the right hip.  Osteopenia.   Electronically Signed   By: Lorriane Shire M.D.   On: 01/16/2015 13:52   Dg Femur, Min 2 Views Right  01/16/2015   CLINICAL DATA:  ORIF right distal femoral fracture  EXAM: RIGHT FEMUR 2 VIEWS  COMPARISON:  None  FLUOROSCOPY TIME:  52 seconds  Total images 5  FINDINGS: Five intraoperative fluoroscopic spot images demonstrate a distal femoral intra medullary nail transfixing a comminuted distal femoral diaphysis fracture. There is mild anterior displacement of the distal fracture fragment relative to the proximal shaft.  IMPRESSION: ORIF right distal femoral fracture.   Electronically Signed   By: Kathreen Devoid   On: 01/16/2015 19:03    EKG: Orders placed or performed during the hospital encounter of 01/16/15  . ED EKG  . ED EKG  . EKG 12-Lead  . EKG 12-Lead  . EKG      Hospital Course: Patient was admitted to Hospital for the above states problem after xrays proved positive for a fracture of theRight distal femur .  Following appropriate workup and evaluation, the patient was taken to the OR and underwent the above state procedure without complications.  Patient tolerated the procedure well and was later transferred to the recovery room and then to the orthopaedic floor for postoperative care.  They were given PO and IV analgesics for pain control following their surgery.  They were given 24 hours of postoperative antibiotics of  Anti-infectives    Start     Dose/Rate Route Frequency Ordered Stop   01/16/15 2330  ceFAZolin (ANCEF) IVPB 2 g/50 mL premix     2 g 100 mL/hr over 30 Minutes Intravenous Every 6 hours 01/16/15 2049 01/17/15 1059   01/16/15 1700  ceFAZolin (  ANCEF) IVPB 2 g/50 mL premix     2 g 100 mL/hr over 30 Minutes Intravenous  Once 01/16/15 1642 01/16/15 1735   01/16/15 1645  ceFAZolin (ANCEF) IVPB 1 g/50 mL premix  Status:  Discontinued     1 g 100 mL/hr over 30 Minutes Intravenous  Once 01/16/15 1632 01/16/15 1652     and started on DVT prophylaxis in the form of Lovenox.   PT and OT were ordered for gait training and ambulation.  The patient's weight bearing status was touch down weight bearing with therapy. Discharge planning was consulted to help with postop disposition and equipment needs.  Patient had a rough night on the evening of surgery.  They started to get up OOB with therapy on day one but they did have some dizziness.  They continued to work with therapy into day two.  Dressing was changed on day two and the incision was healing well.  By day three, the patient had progressed with therapy and meeting their goals.  Incision was healing well.  Equipment was  ordered for home.  Patient was seen in rounds on POD 4 and was ready to go to home once the equipment was ordered.  Discharge home with home health if does well Diet - Cardiac diet and Diabetic diet Follow up - in 2 weeks Activity - TDWB ONLY to the right leg Disposition - Home  Condition Upon Discharge - Pending at this time D/C Meds - See DC Summary DVT Prophylaxis - Aspirin at time of discharge      Discharge Instructions    Bed to Chair Transfer    Complete by:  As directed   Bed to Wheelchair     Call MD / Call 911    Complete by:  As directed   If you experience chest pain or shortness of breath, CALL 911 and be transported to the hospital emergency room.  If you develope a fever above 101 F, pus (white drainage) or increased drainage or redness at the wound, or calf pain, call your surgeon's office.     Call MD / Call 911    Complete by:  As directed   If you experience chest pain or shortness of breath, CALL 911 and be transported to the hospital emergency room.  If you develope a fever above 101 F, pus (white drainage) or increased drainage or redness at the wound, or calf pain, call your surgeon's office.     Change dressing    Complete by:  As directed   You may change your dressing dressing daily with sterile 4 x 4 inch gauze dressing and paper tape.  Do not submerge the incision under water.     Constipation Prevention    Complete by:  As directed   Drink plenty of fluids.  Prune juice may be helpful.  You may use a stool softener, such as Colace (over the counter) 100 mg twice a day.  Use MiraLax (over the counter) for constipation as needed.     Constipation Prevention    Complete by:  As directed   Drink plenty of fluids.  Prune juice may be helpful.  You may use a stool softener, such as Colace (over the counter) 100 mg twice a day.  Use MiraLax (over the counter) for constipation as needed.     Diet - low sodium heart healthy    Complete by:  As directed      Diet  - low sodium heart  healthy    Complete by:  As directed      Diet Carb Modified    Complete by:  As directed      Discharge instructions    Complete by:  As directed   Pick up stool softner and laxative for home use following surgery while on pain medications. Do not submerge incision under water. Please use good hand washing techniques while changing dressing each day. May shower starting three days after surgery. Please use a clean towel to pat the incision dry following showers. Continue to use ice for pain and swelling after surgery. Do not use any lotions or creams on the incision until instructed by your surgeon.  Take a 325 mg Aspirin twice a day for three weeks and then reduce back to baby 81 mg Aspirin daily.  TOUCH DOWN WEIGHT BEARING ONLY TO THE RIGHT LEG  Postoperative Constipation Protocol  Constipation - defined medically as fewer than three stools per week and severe constipation as less than one stool per week.  One of the most common issues patients have following surgery is constipation.  Even if you have a regular bowel pattern at home, your normal regimen is likely to be disrupted due to multiple reasons following surgery.  Combination of anesthesia, postoperative narcotics, change in appetite and fluid intake all can affect your bowels.  In order to avoid complications following surgery, here are some recommendations in order to help you during your recovery period.  Colace (docusate) - Pick up an over-the-counter form of Colace or another stool softener and take twice a day as long as you are requiring postoperative pain medications.  Take with a full glass of water daily.  If you experience loose stools or diarrhea, hold the colace until you stool forms back up.  If your symptoms do not get better within 1 week or if they get worse, check with your doctor.  Dulcolax (bisacodyl) - Pick up over-the-counter and take as directed by the product packaging as needed to assist  with the movement of your bowels.  Take with a full glass of water.  Use this product as needed if not relieved by Colace only.   MiraLax (polyethylene glycol) - Pick up over-the-counter to have on hand.  MiraLax is a solution that will increase the amount of water in your bowels to assist with bowel movements.  Take as directed and can mix with a glass of water, juice, soda, coffee, or tea.  Take if you go more than two days without a movement. Do not use MiraLax more than once per day. Call your doctor if you are still constipated or irregular after using this medication for 7 days in a row.  If you continue to have problems with postoperative constipation, please contact the office for further assistance and recommendations.  If you experience "the worst abdominal pain ever" or develop nausea or vomiting, please contact the office immediatly for further recommendations for treatment.     Do not sit on low chairs, stoools or toilet seats, as it may be difficult to get up from low surfaces    Complete by:  As directed      Driving restrictions    Complete by:  As directed   No driving until released by the physician.     Increase activity slowly as tolerated    Complete by:  As directed      Increase activity slowly as tolerated    Complete by:  As directed  Lifting restrictions    Complete by:  As directed   No lifting until released by the physician.     Patient may shower    Complete by:  As directed   You may shower without a dressing once there is no drainage.  Do not wash over the wound.  If drainage remains, do not shower until drainage stops.     TED hose    Complete by:  As directed   Use stockings (TED hose) for 3 weeks on both leg(s).  You may remove them at night for sleeping.     Touch down weight bearing    Complete by:  As directed   Laterality:  right  Extremity:  Lower     Touch down weight bearing    Complete by:  As directed   Laterality:  right  Extremity:   Lower            Medication List    STOP taking these medications        aspirin 81 MG tablet     cephALEXin 250 MG capsule  Commonly known as:  KEFLEX     ergocalciferol 50000 UNITS capsule  Commonly known as:  VITAMIN D2     ICAPS AREDS FORMULA PO      TAKE these medications        ASCRIPTIN 325 MG Tabs  Take 325 mg by mouth 2 (two) times daily. Take twice a day for three weeks and then reduce back to 81 mg Aspirin daily     cyclobenzaprine 5 MG tablet  Commonly known as:  FLEXERIL  Take 1 tablet (5 mg total) by mouth 3 (three) times daily as needed for muscle spasms.     exenatide 10 MCG/0.04ML Sopn injection  Commonly known as:  BYETTA  Inject 10 mcg into the skin daily.     glycerin adult 2 G Supp  Commonly known as:  glycerin adult  Place 1 suppository rectally once.     iron polysaccharides 150 MG capsule  Commonly known as:  NIFEREX  Take 1 capsule (150 mg total) by mouth 2 (two) times daily.     ondansetron 4 MG disintegrating tablet  Commonly known as:  ZOFRAN ODT  Take 1 tablet (4 mg total) by mouth every 8 (eight) hours as needed for nausea.     oxyCODONE 5 MG immediate release tablet  Commonly known as:  Oxy IR/ROXICODONE  Take 1-3 tablets (5-15 mg total) by mouth every 3 (three) hours as needed for moderate pain, severe pain or breakthrough pain.     pioglitazone-metformin 15-850 MG per tablet  Commonly known as:  ACTOPLUS MET  Take 1 tablet by mouth every evening.     ramipril 10 MG capsule  Commonly known as:  ALTACE  Take 20 mg by mouth every evening.     simvastatin 40 MG tablet  Commonly known as:  ZOCOR  Take 40 mg by mouth every evening.     traMADol 50 MG tablet  Commonly known as:  ULTRAM  Take 1-2 tablets (50-100 mg total) by mouth every 6 (six) hours as needed (mild pain).       Follow-up Information    Follow up with Gearlean Alf, MD. Schedule an appointment as soon as possible for a visit on 01/30/2015.   Specialty:   Orthopedic Surgery   Why:  Call office at [phone] ASAP to set up appointment on Tuesday 01/30/2015   Contact information:   [address]  [place] [address] [phone]       Follow up with [facility].   Why:  Home Health Physical Therapy and Social Worker   Contact information:   [address] [place] [address] [phone]       Signed: [name], PA-C Orthopaedic Surgery 02/07/2015, 8:18 PM

## 2015-01-19 NOTE — Progress Notes (Signed)
Physical Therapy Treatment Note    01/19/15 1500  PT Visit Information  Last PT Received On 01/19/15  Assistance Needed +2  History of Present Illness Pt is a 69 year old female s/p retrograde intramedullary nailing of right distal femur fracture due to fall in cafeteria also with right eye swelling and ecchymosis  PT Time Calculation  PT Start Time (ACUTE ONLY) 1519  PT Stop Time (ACUTE ONLY) 1547  PT Time Calculation (min) (ACUTE ONLY) 28 min  Subjective Data  Subjective Pt performing transfers and maintaining TDWB better this afternoon.  Pt plans to d/c home tomorrow.  Precautions  Precautions Fall  Restrictions  Weight Bearing Restrictions Yes  RLE Weight Bearing TWB  Pain Assessment  Pain Assessment 0-10  Pain Score 3  Pain Location R knee  Pain Descriptors / Indicators Aching;Discomfort  Pain Intervention(s) Limited activity within patient's tolerance;Monitored during session;Repositioned;Ice applied  Cognition  Arousal/Alertness Awake/alert  Behavior During Therapy WFL for tasks assessed/performed  Overall Cognitive Status Within Functional Limits for tasks assessed  Bed Mobility  Overal bed mobility + 2 for safety/equipment;Needs Assistance  Bed Mobility Supine to Sit;Sit to Supine  Supine to sit Min guard;HOB elevated  Sit to supine Mod assist  General bed mobility comments assist for BIL LEs onto bed, required repositioning bed and use of trapeze and rails for self assist  Transfers  Overall transfer level Needs assistance  Transfers Lateral/Scoot Transfers  Lateral/Scoot Transfers Min guard;+2 safety/equipment  General transfer comment reviewed safe transfer techniques and w/c and bed positioning, cues for TDWB however improved since prior visit, pt required encouragement however was able perform transfers with min/guard and set up assist, more time and effor upon return to bed due to fatigue  Wheelchair Mobility  Wheelchair mobility Yes  Wheelchair propulsion  Both upper extremities  Wheelchair parts Supervision/cueing  Distance 80  Wheelchair Assistance Details (indicate cue type and reason) verbal cues for use of brakes and for turning, pt making good progress as she has never used w/c before  PT - End of Session  Activity Tolerance Patient tolerated treatment well  Patient left in bed;with call bell/phone within reach  PT - Assessment/Plan  PT Plan Current plan remains appropriate  PT Frequency (ACUTE ONLY) Min 5X/week  Follow Up Recommendations SNF;Supervision/Assistance - 24 hour  PT equipment Wheelchair (measurements PT);Hospital bed;Other (comment);Rolling walker with 5" wheels (((elevating leg rests for w/c, drop arm BSC, RW with L platf)  PT Goal Progression  Progress towards PT goals Progressing toward goals  PT General Charges  $$ ACUTE PT VISIT 1 Procedure  PT Treatments  $Therapeutic Activity 8-22 mins  $Wheel Chair Management 8-22 mins   Carmelia Bake, PT, DPT 01/19/2015 Pager: [phone]

## 2015-01-19 NOTE — Progress Notes (Signed)
CSW met with pt / family to provide signed FL2 in case SNF is needed from home. Pt continues to decline SNF placement.  [name] LCSW [phone]

## 2015-01-20 LAB — GLUCOSE, CAPILLARY
GLUCOSE-CAPILLARY: 154 mg/dL — AB (ref 70–99)
Glucose-Capillary: 163 mg/dL — ABNORMAL HIGH (ref 70–99)

## 2015-01-20 NOTE — Progress Notes (Signed)
CARE MANAGEMENT NOTE 01/20/2015  Patient:  Kelly [name], Kelly [name]   Account Number:  [id number]  Date Initiated:  01/17/2015  Documentation initiated by:  [facility]  Subjective/Objective Assessment:   adm: INTRAMEDULLARY (IM) RETROGRADE FEMORAL NAILING (Right)     Action/Plan:   discharge planning   Anticipated DC Date:  01/20/2015   Anticipated DC Plan:  [place]  CM consult      [facility] Choice  NA   Choice offered to / List presented to:     DME arranged  3-N-1  [place] arranged  [place]      [place] agency  [place]   Status of service:  Completed, signed off Medicare Important Message given?   (If response is "NO", the following Medicare IM given date fields will be blank) Date Medicare IM given:   Medicare IM given by:   Date Additional Medicare IM given:   Additional Medicare IM given by:    Discharge Disposition:  [place]  Per UR Regulation:  Reviewed for med. necessity/level of care/duration of stay  If discussed at [place] of Stay Meetings, dates discussed:    Comments:  01/20/2015 1030 NCM spoke to pt and states her DME has been delivered. She is still waiting on wheelchair. [employer] notified of scheduled dc home today. Jonnie Finner RN CCM Case Mgmt phone [phone]  01/19/15 Dessa Phi RN BSN NCM [phone] 3:10p-Per wroker's comp RN CM [place] [place] agency has auth for [facility]. TC Tim [employer] rep aware,soc on Monday.Nsg aware & agree.[employer] dme company for dme-they will make arrangements for delivery of dme to patient's home.Please fax d/c summary @ d/c-Joyce [place] fax#[phone].  Worker's comp Transport planner tel#[phone],& this CM went into patient's rm to discuss d/c plans.Patient plans to d/c home w/HH/DME.Has spouse support Yvone Neu tel#[phone],& also patient's  son for support.PT to work w/patient again today.For d/c in am.Faxed toJoyce fax w/confirmation:fax#[phone]-w/c,hospital bed,3n1,shower chair,rw, also hhpt/hh Education officer, museum orders,w/face to face.worker's comp RN-Joyce will contact their dme liason,& HHC liason for services.I have contacted [employer] rep Tim-aware of possible [place] services if approved by worker's comp.Await d/c order, & confirmation of worker's comp approved [place] agency, & DME rep.Please fax d/c summary to [facility] fax#[phone], & Becky [phone] @ d/c.  01/19/15 08:20 CM Freddrick March and left number of contact Agustina Caroli [phone] who will be at pt's home today to receive DME.  When pt is ready for discharge and DME is in place, please contact CSW to arrange non-emergency transport.  Mariane Masters, BSn, CM [phone].  08:20 Cm faxed facesheet, F2F, H&P, OP Note, PT EVAL and orders to [facility] and is waiting for callback to confirm arangements.  No other Cm needs were communicatred.  Mariane Masters, BSN, Cm [phone].  01/19/15 LATE ENTRY: CM received callback from Daune Perch lst evening giving [employer] as [facility] adjuster: Henrene Pastor Number: [id number] and can be reached at [phone] ext [phone].  Fax number is [phone].  Orders have been requested and need to be faxed to the aforementioned number for DME and HHPT.  Waiting for orders.  Mariane Masters, BSN, [place].  01/18/15 14:35 CM called Daune Perch (insurance provider) as Jearld Lesch  had not received callback; Mr. Gilford Rile does not have an Adjuster or Claim number as of yet but states he is working on it and will call me back as soon as he is able.  Will continue to follow to fax necessary referral and DMEorders to [place].  Mariane Masters, BSN, [place].  01/17/15 09:00 Cm met with pt in room to discuss disposiiton; undetermined at this time; dependent upon progress.  CM called [employer] Workers Comp number on [facility]; unfortunately this is the employer number  who referred me to Daune Perch [phone] who states he is unsure at this time the name of the Adjuster but is working on the claim and will call me morning of 01/18/15 with correct WC adjuster, subscriber number and contact number. CM will arrange with adjuster [facility] services and DME when information is available.  Will continue to monitor.  Mariane Masters, BSN, [phone].

## 2015-01-20 NOTE — Progress Notes (Signed)
Clinical Social Work  Therapist, sports reports patient requesting PTAR home and will be ready at 2pm. CSW completed PTAR forms and verified address with patient. Patient had family in the room and agreeable to DC. Patient reports all equipment and HH has been arranged. Patient agreeable to PTAR and aware of no guarantee of payment. PTAR arranged and forms placed on chart.   CSW is signing off but available if needed.  Sindy Messing, LCSW (Weekend Coverage)

## 2015-01-20 NOTE — Progress Notes (Signed)
Occupational Therapy Treatment and Discharge Patient Details Name: FLORIA BRANDAU MRN: [id number] DOB: Feb 01, 1946 Today's Date: 01/20/2015    History of present illness Pt is a 69 year old female s/p retrograde intramedullary nailing of right distal femur fracture due to fall in cafeteria also with right eye swelling and ecchymosis as well as painful LUE (no fx)   OT comments  This 69 yo female admitted and underwent above presents to acute OT without any further questions about BADLs from her or daughter post our discussion. Acute OT will sign off.  Follow Up Recommendations  Home health OT    Equipment Recommendations  3 in 1 bedside comode (drop arm)       Precautions / Restrictions Precautions Precautions: Fall Restrictions Weight Bearing Restrictions: Yes LUE Weight Bearing: Weight bearing as tolerated (PFRW) RLE Weight Bearing: Touchdown weight bearing              ADL Overall ADL's : Needs assistance/impaired                                       General ADL Comments: Pt supine in bed upon arrival with daughter in room with her. Pt has really been struggling with stand pivot transfers to 3n1 and maintaining TDWB on RLE (we talked about how the commode is a drop arm commode in her room and that is what we have recommend for home as of yesterday--but not changed in chart until today). I also educated them on maybe having to put a towel between bed and 3n1 at home if there is a small gap.  I talked to them about putting some sort of powder (baby, baking soda) down on 3n1 so it will make it easier for her to slide and so she does not stick when trying to get back off of it.  I informed them that I thought they could get hospital gowns from [facility] since pt stated that she really would like to have hospital gowns at home.  We also talked about the perhaps need for a transfer board for W/C to car transfers, but to wait and see how she gets along with  transfers for about a week before she talks to her [facility] therapists about it. Gave pt and daughter the oppotunity to practice lateral transfers to 3n1 but pt politely declined stating she had gotten up to drop arm BSC earlier                Cognition   Behavior During Therapy: [facility] for tasks assessed/performed Overall Cognitive Status: Within Functional Limits for tasks assessed                                               Frequency Min 2X/week     Progress Toward Goals  OT Goals(current goals can now be found in the care plan section)  Progress towards OT goals:  (Pt and daughter without any further questions about BADLs)     Plan Discharge plan remains appropriate             Patient Left in bed;with call bell/phone within reach;with family/visitor present   Nurse Communication  (CM--need to change to drop arm 3n1 due to TDWB status and pt having issues with maintaining this)  Time: 8867-7373 OT Time Calculation (min): 33 min  Charges: OT General Charges $OT Visit: 1 Procedure OT Treatments $Self Care/Home Management : 23-37 mins  Almon Register [phone] 01/20/2015, 10:06 AM

## 2015-01-20 NOTE — Progress Notes (Signed)
Physical Therapy Treatment Patient Details Name: Kelly [name] MRN: [id number] DOB: 1946/03/27 Today's Date: 01/20/2015    History of Present Illness Pt is a 69 year old female s/p retrograde intramedullary nailing of right distal femur fracture due to fall in cafeteria also with right eye swelling and ecchymosis as well as painful LUE (no fx)    PT Comments    Pt progressing, although slowly; continue to recommend SNF but pt refuses; will need 24hr assist at home; she is at risk for falls d/t TDWB, size and generalized weakness;  Follow Up Recommendations  SNF;Supervision/Assistance - 24 hour;Home health PT (will need HHPT, rec SNF but pt refuses)     Equipment Recommendations  Wheelchair (measurements PT);Hospital bed;Other (comment)    Recommendations for Other Services       Precautions / Restrictions Precautions Precautions: Fall Restrictions RLE Weight Bearing: Touchdown weight bearing    Mobility  Bed Mobility Overal bed mobility: Needs Assistance Bed Mobility: Supine to Sit     Supine to sit: HOB elevated;+2 for safety/equipment;Mod assist     General bed mobility comments: pt dtr assisting with cues and supervision from PT to ensure safe techniques  Transfers Overall transfer level: Needs assistance   Transfers: Lateral/Scoot Transfers          Lateral/Scoot Transfers: Min assist;Min guard;+2 safety/equipment;With slide board (anad without sliding board) General transfer comment: pt and dtr able to perform lateral scoot transfers with min/min guard assist and cues for sequencing, position, techniques and correct use of DME; pt requires incr time and effort to perform transfers;   Ambulation/Gait                 Hotel manager mobility: Yes Wheelchair propulsion: Both upper extremities Wheelchair parts: Supervision/cueing Distance: 160 Wheelchair Assistance Details (indicate cue  type and reason): verbal cues and occasional hand over hand for w/c maneuvering;pt and dtr require cues and intermittent assist for manipulation of ELRs, brakes, armrests  Modified Rankin (Stroke Patients Only)       Balance                                    Cognition Arousal/Alertness: Awake/alert Behavior During Therapy: WFL for tasks assessed/performed Overall Cognitive Status: Within Functional Limits for tasks assessed                      Exercises      General Comments        Pertinent Vitals/Pain Pain Assessment: 0-10 Pain Score: 4  Pain Location: RLE Pain Descriptors / Indicators: Sore Pain Intervention(s): Limited activity within patient's tolerance;Monitored during session;Repositioned    Home Living                      Prior Function            PT Goals (current goals can now be found in the care plan section) Acute Rehab PT Goals Patient Stated Goal: get back to being independent PT Goal Formulation: With patient/family Time For Goal Achievement: 01/24/15 Potential to Achieve Goals: Good Progress towards PT goals: Progressing toward goals    Frequency  Min 5X/week    PT Plan Current plan remains appropriate    Co-evaluation             End of  Session   Activity Tolerance: Patient tolerated treatment well Patient left: Other (comment);with call bell/phone within reach;with family/visitor present (on 3in1 adn NT made aware)     Time: 3491-7915 PT Time Calculation (min) (ACUTE ONLY): 33 min  Charges:  $Therapeutic Activity: 8-22 mins $Wheel Chair Management: 8-22 mins                    G Codes:      Satina Jerrell 2015-02-06, 2:01 PM

## 2015-01-20 NOTE — Progress Notes (Signed)
Subjective: 4 Days Post-Op Procedure(s) (LRB): INTRAMEDULLARY (IM) RETROGRADE FEMORAL NAILING (Right) Patient reports pain as mild.  Eager to go home.  Objective: Vital signs in last 24 hours: Temp:  [97.8 F (36.6 C)-99.7 F (37.6 C)] 97.9 F (36.6 C) (02/13 0617) Pulse Rate:  [79-102] 85 (02/13 0617) Resp:  [16-20] 18 (02/13 0617) BP: (132-169)/(48-63) 145/49 mmHg (02/13 0617) SpO2:  [94 %-100 %] 100 % (02/13 0617)  Intake/Output from previous day: 02/12 0701 - 02/13 0700 In: 480 [P.O.:480] Out: 650 [Urine:650] Intake/Output this shift:     Recent Labs  01/18/15 0438 01/19/15 0540  HGB 8.1* 8.0*    Recent Labs  01/18/15 0438 01/19/15 0540  WBC 9.3 9.9  RBC 2.80* 2.83*  HCT 25.2* 25.4*  PLT 215 241    Recent Labs  01/17/15 1343 01/18/15 0438  NA 140 138  K 4.1 4.2  CL 107 107  CO2 26 23  BUN 18 16  CREATININE 0.59 0.50  GLUCOSE 129* 160*  CALCIUM 8.5 8.3*   No results for input(s): LABPT, INR in the last 72 hours.  PE:  wn wd woman in nad.  R knee incisions c/d/i.  NVI at [place].  Assessment/Plan: 4 Days Post-Op Procedure(s) (LRB): INTRAMEDULLARY (IM) RETROGRADE FEMORAL NAILING (Right) Discharge home with home health  Wylene Simmer 01/20/2015, 9:48 AM

## 2015-01-22 NOTE — Progress Notes (Signed)
Discharge summary sent to payer through [facility]  

## 2015-06-19 ENCOUNTER — Other Ambulatory Visit: Payer: Self-pay

## 2015-06-19 DIAGNOSIS — Z1231 Encounter for screening mammogram for malignant neoplasm of breast: Secondary | ICD-10-CM

## 2015-07-23 ENCOUNTER — Ambulatory Visit
Admission: RE | Admit: 2015-07-23 | Discharge: 2015-07-23 | Disposition: A | Discharge status: Home / Self Care | Payer: BLUE CROSS/BLUE SHIELD | Source: Ambulatory Visit

## 2015-07-23 DIAGNOSIS — Z1231 Encounter for screening mammogram for malignant neoplasm of breast: Secondary | ICD-10-CM

## 2015-10-20 IMAGING — CR DG WRIST COMPLETE 3+V*L*
4 series · 4 of 4 positions shown · non-contrast
Comparison: None.

CLINICAL DATA: Patient tripped on rug and fell

EXAM:
LEFT WRIST - COMPLETE 3+ VIEW

[x wrist pa left]
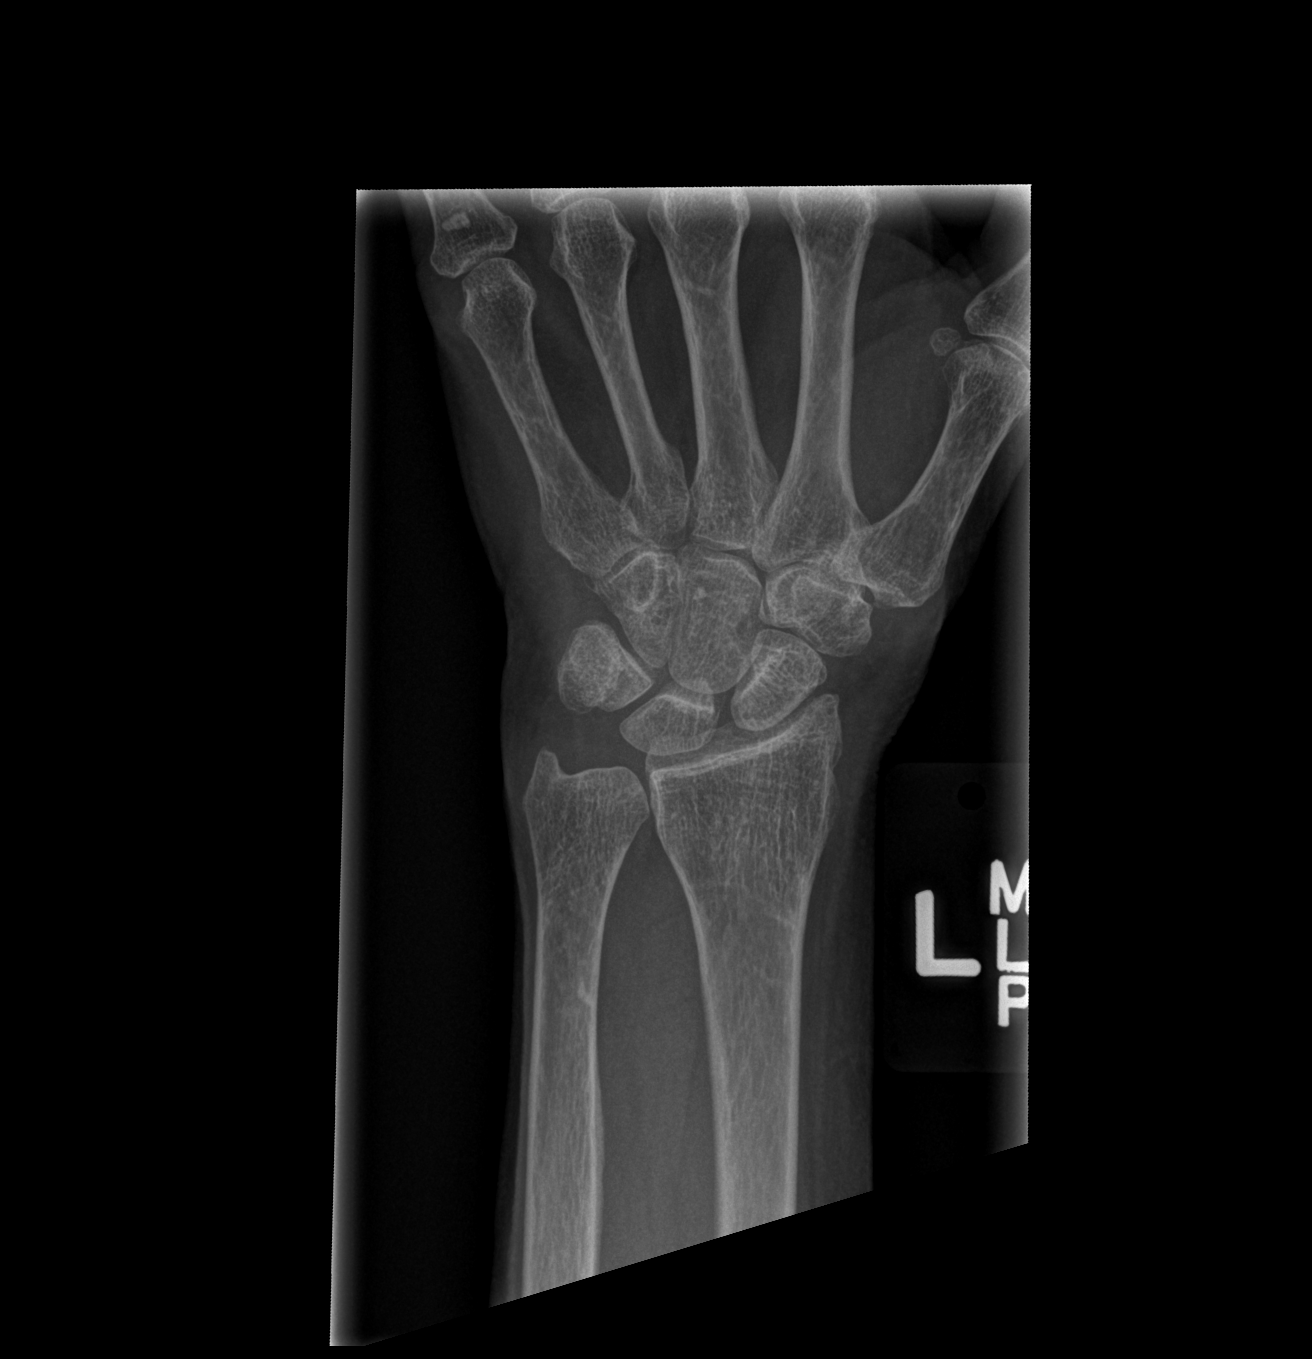

[x wrist obl left]
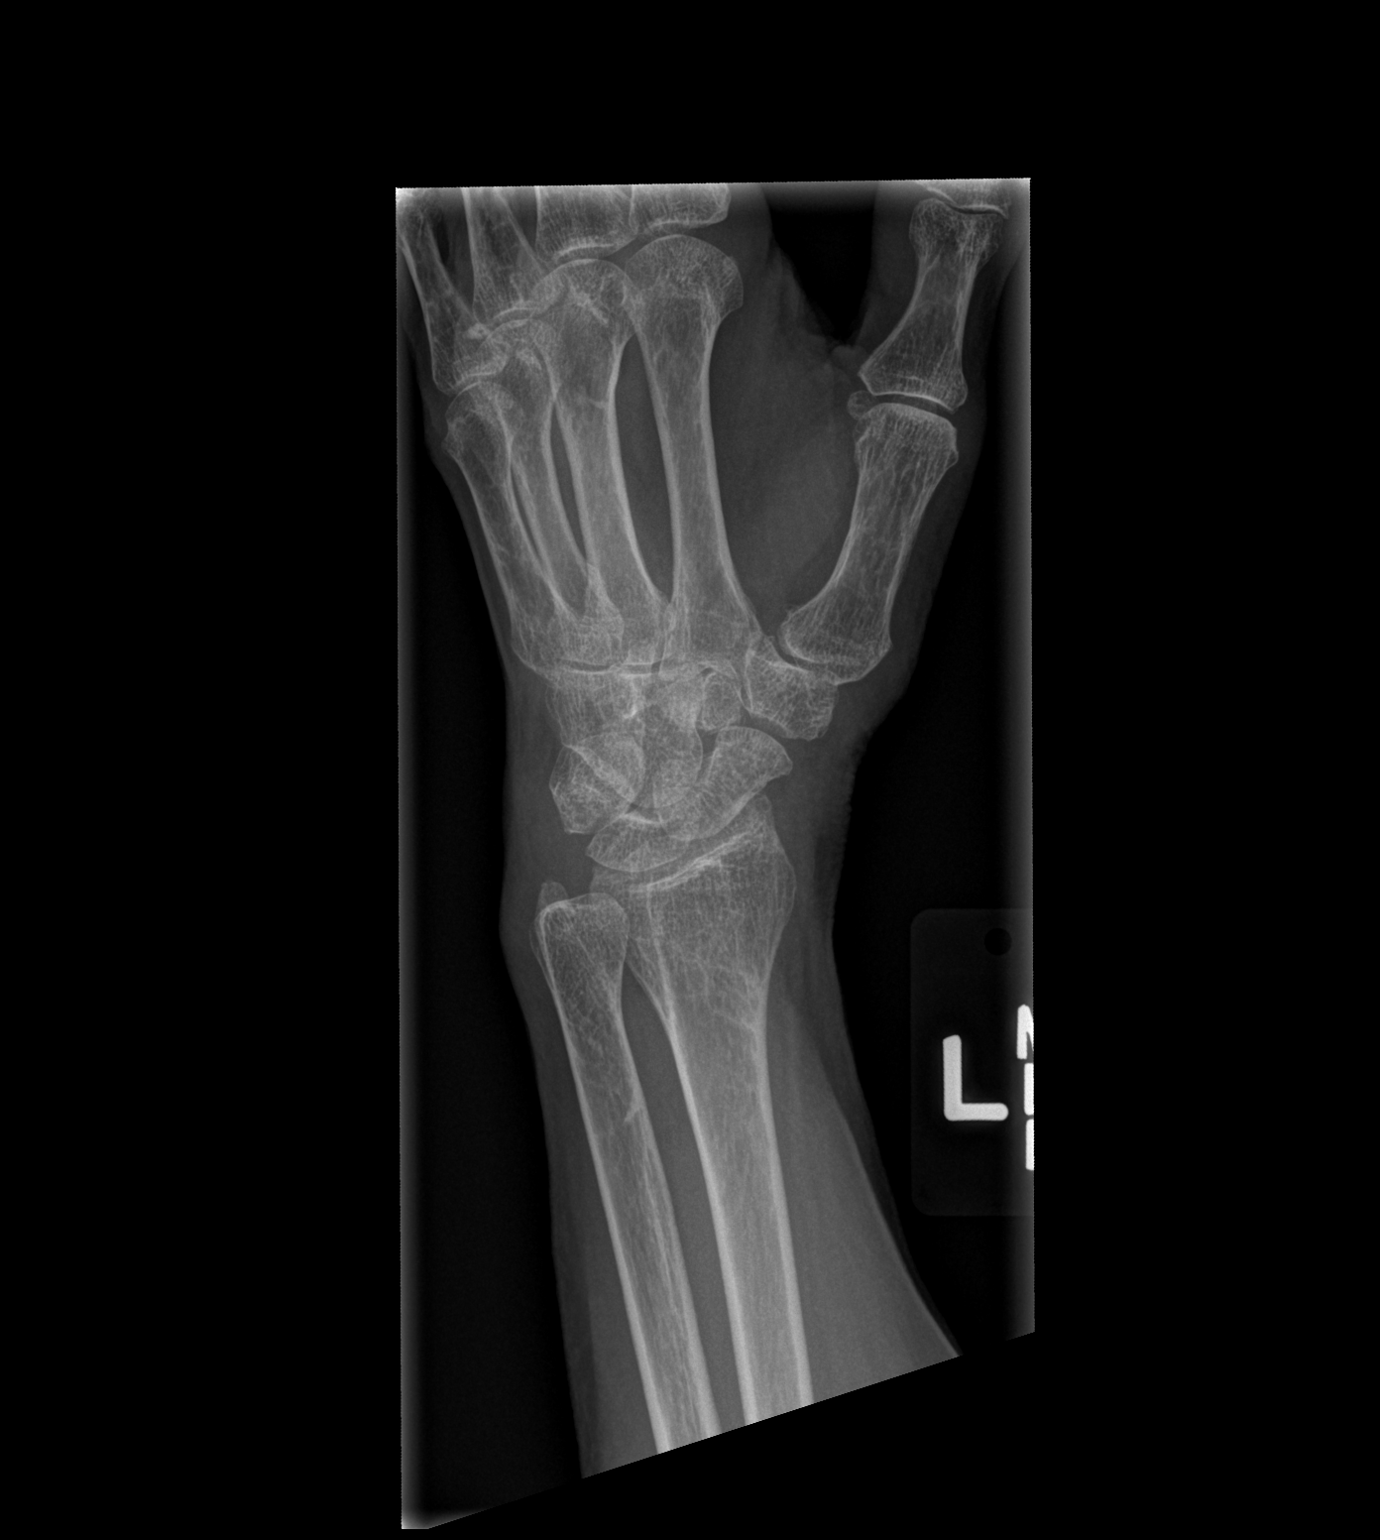

[x wrist lat left]
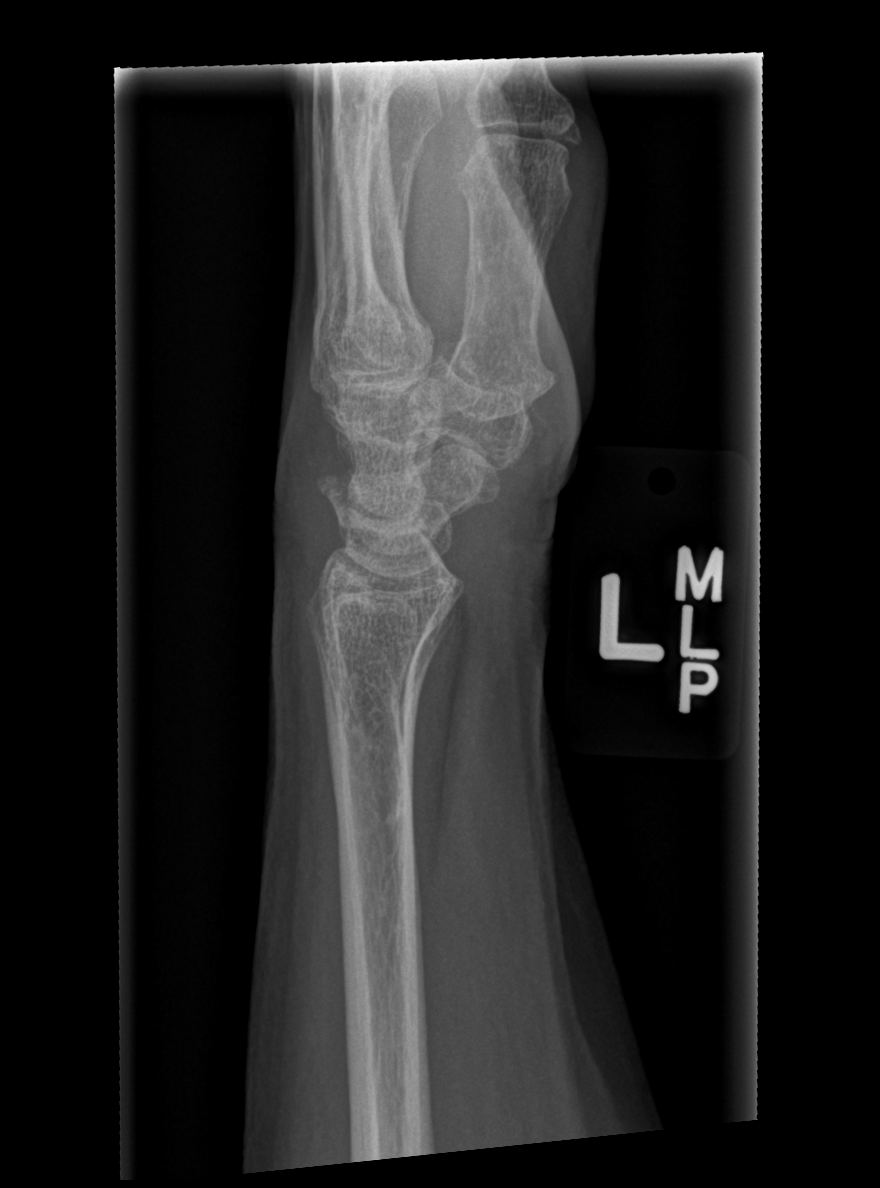

[x wrist navicular view left]
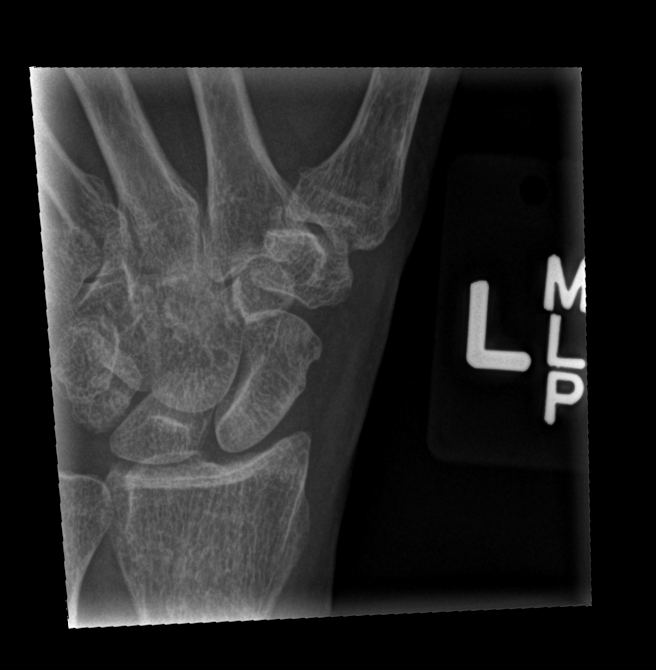

[4 of 4 positions shown; findings below may reference images not displayed]

FINDINGS: Frontal, oblique, lateral, and ulnar deviation scaphoid images were
obtained. Bones are osteoporotic. There is no acute fracture or
dislocation. Joint spaces appear intact. No erosive change.
IMPRESSION: Bones osteoporotic. No fracture or dislocation. No appreciable
arthropathy.

## 2015-10-20 IMAGING — RF DG FEMUR 2+V*R*
1 series · 5 of 5 positions shown · non-contrast
Comparison: None

FLUOROSCOPY TIME:  52 seconds

Total images 5

CLINICAL DATA: ORIF right distal femoral fracture

EXAM:
RIGHT FEMUR 2 VIEWS

[Series 1: run · 5 of 5 slices shown]
[im 1/5]
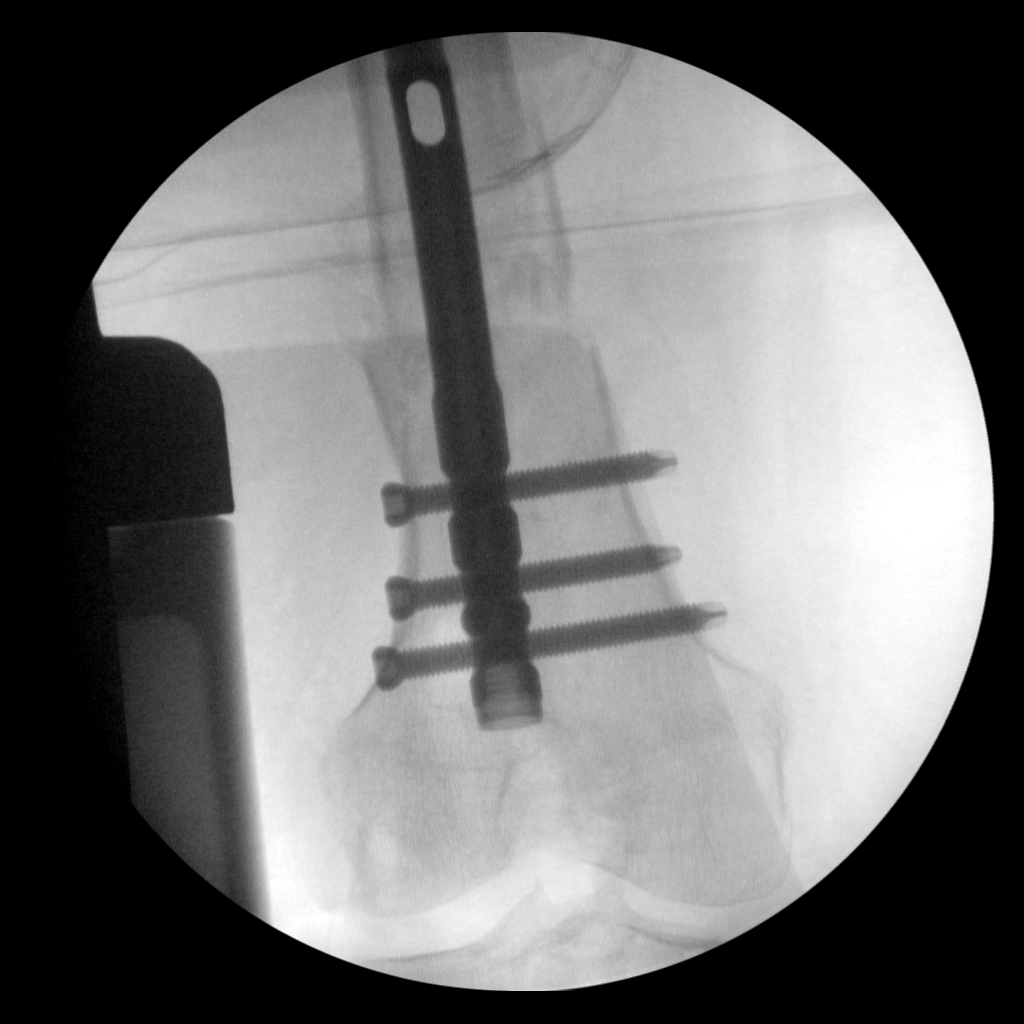
[im 2/5]
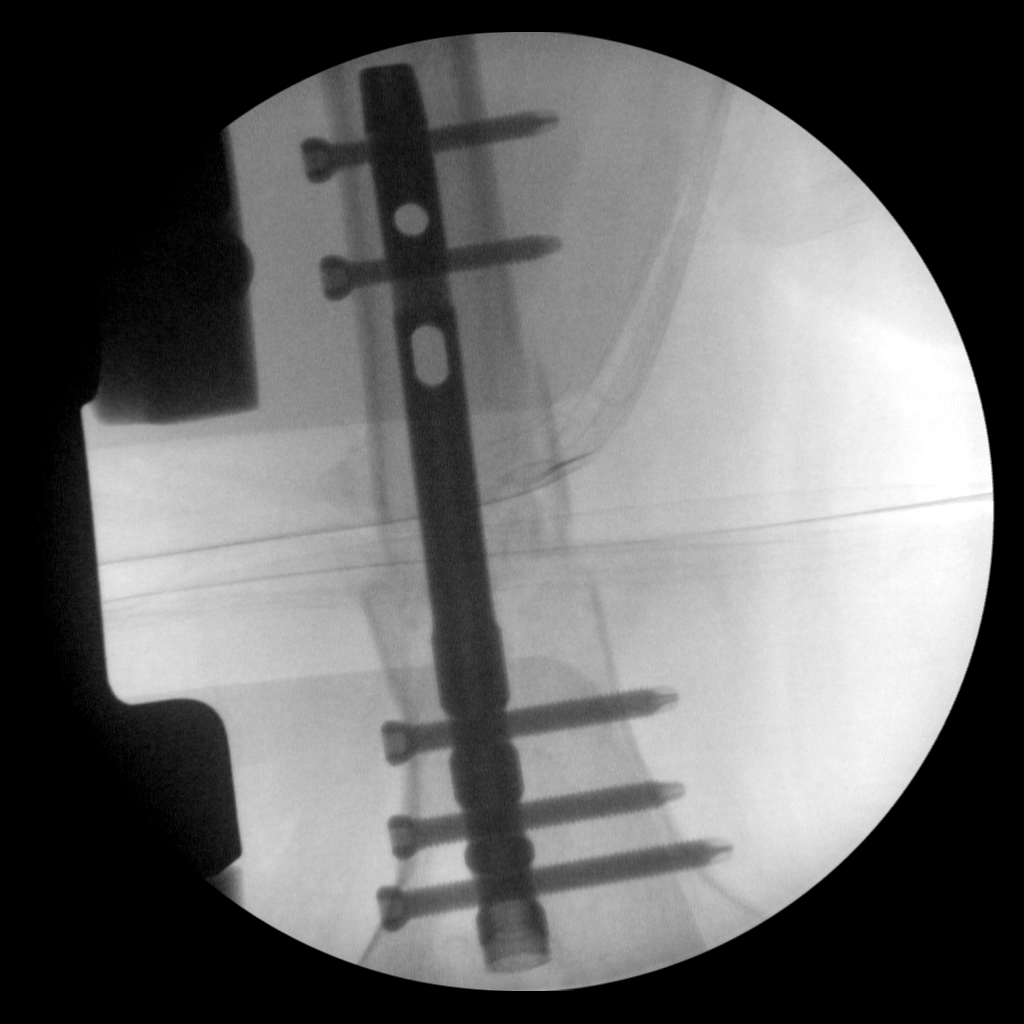
[im 3/5]
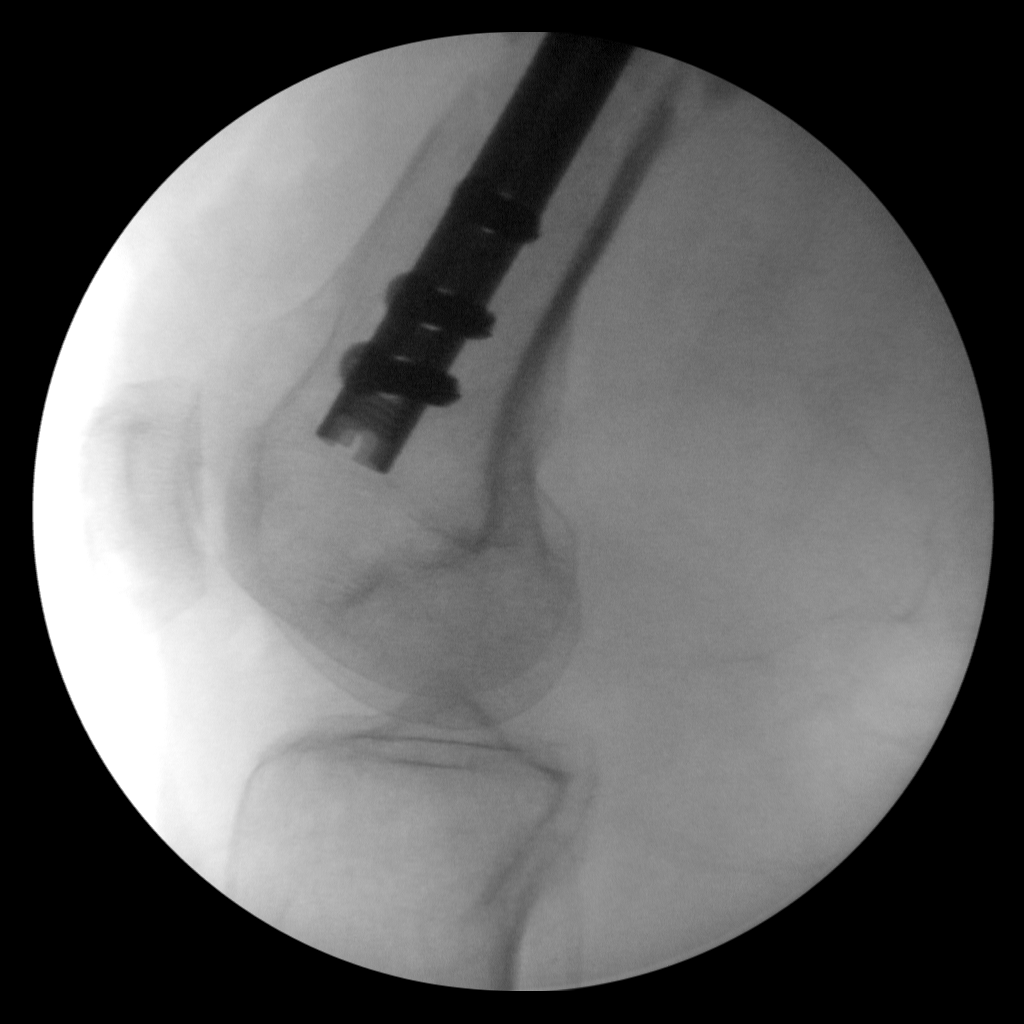
[im 4/5]
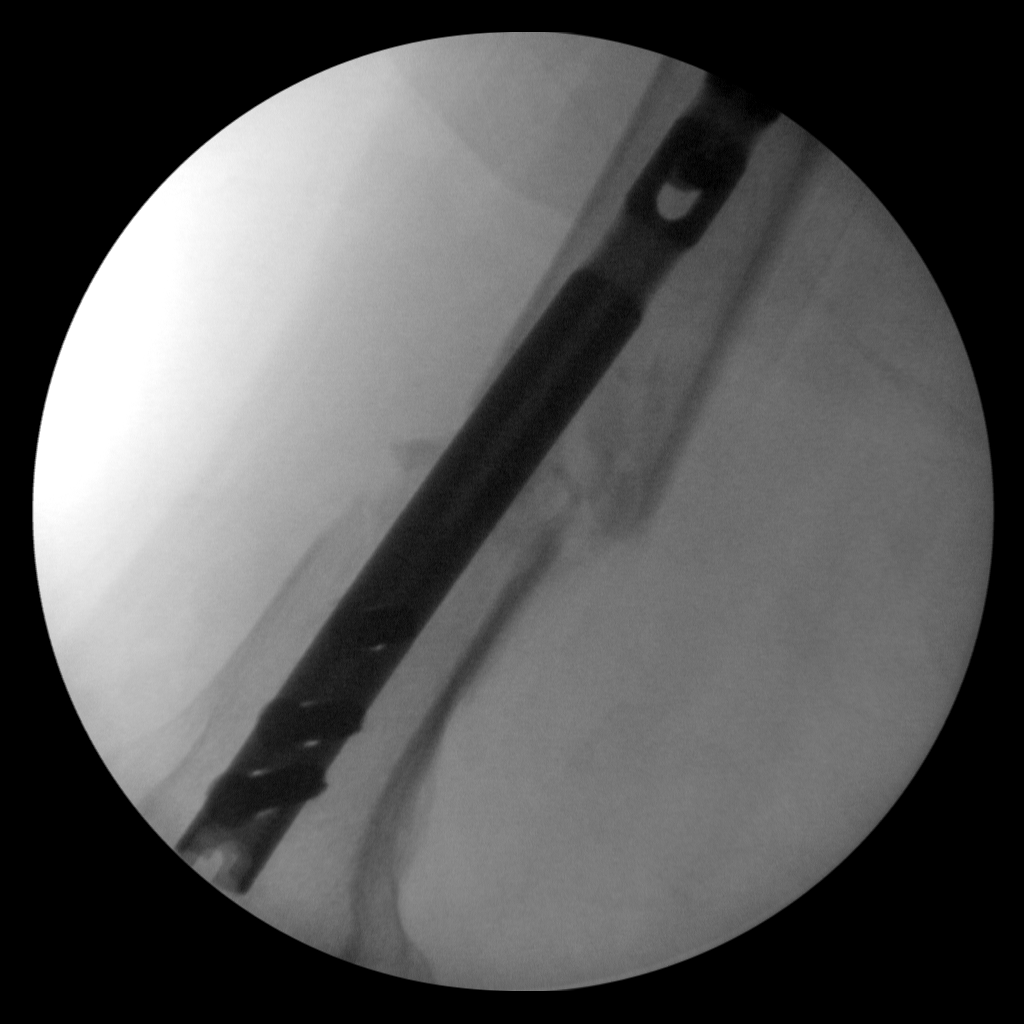
[im 5/5]
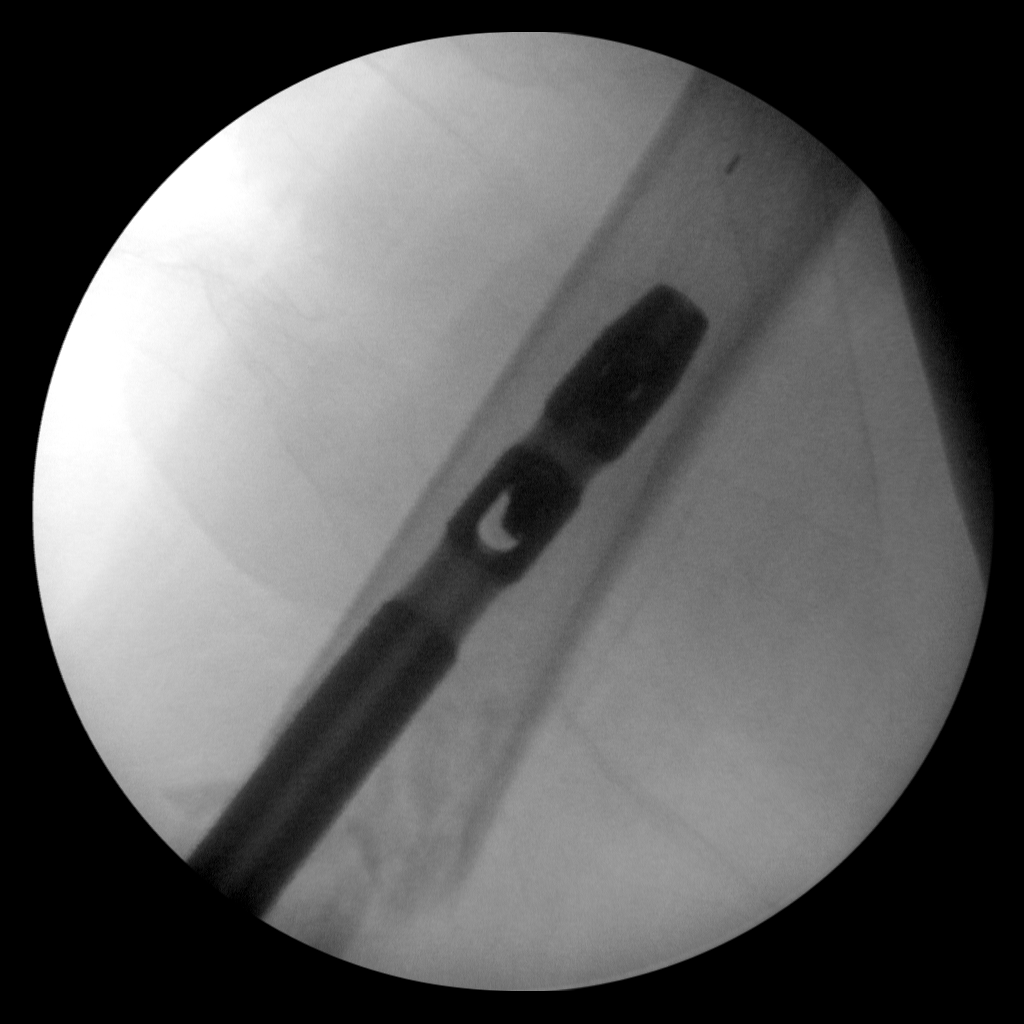

[5 of 5 positions shown; findings below may reference images not displayed]

FINDINGS: Five intraoperative fluoroscopic spot images demonstrate a distal
femoral intra medullary nail transfixing a comminuted distal femoral
diaphysis fracture. There is mild anterior displacement of the
distal fracture fragment relative to the proximal shaft.
IMPRESSION: ORIF right distal femoral fracture.

## 2016-06-30 ENCOUNTER — Other Ambulatory Visit: Payer: Self-pay | Admitting: Endocrinology

## 2016-06-30 DIAGNOSIS — Z1231 Encounter for screening mammogram for malignant neoplasm of breast: Secondary | ICD-10-CM

## 2016-09-03 ENCOUNTER — Ambulatory Visit
Admission: RE | Admit: 2016-09-03 | Discharge: 2016-09-03 | Disposition: A | Discharge status: Home / Self Care | Payer: BLUE CROSS/BLUE SHIELD | Source: Ambulatory Visit | Attending: Endocrinology | Admitting: Endocrinology

## 2016-09-03 DIAGNOSIS — Z1231 Encounter for screening mammogram for malignant neoplasm of breast: Secondary | ICD-10-CM

## 2017-06-11 ENCOUNTER — Other Ambulatory Visit (HOSPITAL_COMMUNITY): Payer: Self-pay | Admitting: *Deleted

## 2017-06-11 ENCOUNTER — Ambulatory Visit (HOSPITAL_COMMUNITY)
Admission: RE | Admit: 2017-06-11 | Discharge: 2017-06-11 | Disposition: A | Discharge status: Home / Self Care | Payer: BLUE CROSS/BLUE SHIELD | Source: Ambulatory Visit | Attending: Nurse Practitioner | Admitting: Nurse Practitioner

## 2017-06-11 ENCOUNTER — Encounter (HOSPITAL_COMMUNITY): Payer: Self-pay | Admitting: Nurse Practitioner

## 2017-06-11 VITALS — BP 160/76 | HR 89 | Ht 62.0 in | Wt 249.0 lb

## 2017-06-11 DIAGNOSIS — I480 Paroxysmal atrial fibrillation: Secondary | ICD-10-CM | POA: Diagnosis not present

## 2017-06-11 DIAGNOSIS — Z7902 Long term (current) use of antithrombotics/antiplatelets: Secondary | ICD-10-CM | POA: Insufficient documentation

## 2017-06-11 DIAGNOSIS — Z9109 Other allergy status, other than to drugs and biological substances: Secondary | ICD-10-CM | POA: Insufficient documentation

## 2017-06-11 DIAGNOSIS — R011 Cardiac murmur, unspecified: Secondary | ICD-10-CM | POA: Diagnosis not present

## 2017-06-11 DIAGNOSIS — Z9889 Other specified postprocedural states: Secondary | ICD-10-CM | POA: Insufficient documentation

## 2017-06-11 DIAGNOSIS — E119 Type 2 diabetes mellitus without complications: Secondary | ICD-10-CM | POA: Diagnosis not present

## 2017-06-11 DIAGNOSIS — I4891 Unspecified atrial fibrillation: Secondary | ICD-10-CM | POA: Insufficient documentation

## 2017-06-11 DIAGNOSIS — Z96641 Presence of right artificial hip joint: Secondary | ICD-10-CM | POA: Insufficient documentation

## 2017-06-11 DIAGNOSIS — M1990 Unspecified osteoarthritis, unspecified site: Secondary | ICD-10-CM | POA: Diagnosis not present

## 2017-06-11 DIAGNOSIS — I10 Essential (primary) hypertension: Secondary | ICD-10-CM | POA: Diagnosis not present

## 2017-06-11 DIAGNOSIS — Z888 Allergy status to other drugs, medicaments and biological substances status: Secondary | ICD-10-CM | POA: Diagnosis not present

## 2017-06-11 DIAGNOSIS — Z7984 Long term (current) use of oral hypoglycemic drugs: Secondary | ICD-10-CM | POA: Insufficient documentation

## 2017-06-11 MED ORDER — APIXABAN 5 MG PO TABS: 5.0000 mg | ORAL_TABLET | Freq: Two times a day (BID) | ORAL | 6 refills | Status: AC

## 2017-06-11 MED ORDER — APIXABAN 5 MG PO TABS: 5.0000 mg | ORAL_TABLET | Freq: Two times a day (BID) | ORAL | 0 refills | Status: DC

## 2017-06-11 NOTE — Progress Notes (Signed)
Primary Care Physician: Reynold Bowen, MD Referring Physician:   MILANIA [name] is a 71 y.o. female with a h/o DM, HTN,  that is in the afib clinic for evaluation as for new onset afib that was noted at finish of cataract surgery this am. Pt is not ware of arrhythmia. She states that this surgery was more painful and she felt herself getting very anxious. She was rate controlled at the surgical center and remains  rate controlled today.  She denies smoking, alcohol, or excessive caffeine. Husband denies pt has apneic spells, does admit she has some snoring. She is sedentary, overweight. Has a knee issue that she plans to have surgery for in a about a year. She has a chadsvasc score of 4.  Today, she denies symptoms of palpitations, chest pain, shortness of breath, orthopnea, PND, lower extremity edema, dizziness, presyncope, syncope, or neurologic sequela. The patient is tolerating medications without difficulties and is otherwise without complaint today.   Past Medical History:  Diagnosis Date  . Arthritis   . Diabetes mellitus without complication ([place])   . Heart murmur   . Hypertension    Past Surgical History:  Procedure Laterality Date  . FEMUR IM NAIL Right 01/16/2015   Procedure: INTRAMEDULLARY (IM) RETROGRADE FEMORAL NAILING;  Surgeon: Gearlean Alf, MD;  Location: [facility];  Service: Orthopedics;  Laterality: Right;  . I&D EXTREMITY Right 08/15/2013   Procedure: INCISION AND DRAINAGE RIGHT HIP INFECTION (PREVIOUS RIGHT TOTAL HIP REPLACEMENT);  Surgeon: Gearlean Alf, MD;  Location: [facility];  Service: Orthopedics;  Laterality: Right;  . I&D EXTREMITY Right 09/12/2013   Procedure: IRRIGATION AND DEBRIDEMENT RIGHT  LATERAL THIGH;  Surgeon: Gearlean Alf, MD;  Location: [facility];  Service: Orthopedics;  Laterality: Right;  . INCISION AND DRAINAGE HIP Right 09/09/2013   Procedure: IRRIGATION AND DEBRIDEMENT HIP;  Surgeon: Augustin Schooling, MD;  Location: [facility];  Service: Orthopedics;   Laterality: Right;  . TOTAL HIP ARTHROPLASTY Right 07/07/2013   Procedure: RIGHT TOTAL HIP ARTHROPLASTY;  Surgeon: Gearlean Alf, MD;  Location: [facility];  Service: Orthopedics;  Laterality: Right;    Current Outpatient Prescriptions  Medication Sig Dispense Refill  . Cholecalciferol (HM VITAMIN D3) 4000 units CAPS Take by mouth.    . cyclobenzaprine (FLEXERIL) 5 MG tablet Take 1 tablet (5 mg total) by mouth 3 (three) times daily as needed for muscle spasms. 90 tablet 0  . Multiple Vitamins-Minerals (ICAPS AREDS 2 PO) Take by mouth.    . pioglitazone-metformin (ACTOPLUS MET) 15-850 MG per tablet Take 1 tablet by mouth every evening.    . ramipril (ALTACE) 10 MG capsule Take 20 mg by mouth every evening.    . simvastatin (ZOCOR) 40 MG tablet Take 40 mg by mouth every evening.    . Teriparatide, Recombinant, (FORTEO) 600 MCG/2.4ML SOLN Inject 20 mcg into the skin.    Marland Kitchen traMADol (ULTRAM) 50 MG tablet Take 1-2 tablets (50-100 mg total) by mouth every 6 (six) hours as needed (mild pain). 60 tablet 1  . Vitamin D, Ergocalciferol, (DRISDOL) 50000 units CAPS capsule TK ONE C PO Q WEEK  2  . apixaban (ELIQUIS) 5 MG TABS tablet Take 1 tablet (5 mg total) by mouth 2 (two) times daily. 60 tablet 0  . Aspirin Buf,AlHyd-MgHyd-CaCar, (ASCRIPTIN) 325 MG TABS Take 325 mg by mouth 2 (two) times daily. Take twice a day for three weeks and then reduce back to 81 mg Aspirin daily (Patient not taking: Reported  on 06/11/2017) 42 each 0  . oxyCODONE (OXY IR/ROXICODONE) 5 MG immediate release tablet Take 1-3 tablets (5-15 mg total) by mouth every 3 (three) hours as needed for moderate pain, severe pain or breakthrough pain. (Patient not taking: Reported on 06/11/2017) 90 tablet 0   No current facility-administered medications for this encounter.     Allergies  Allergen Reactions  . Tape Other (See Comments)    Tears skin  . Robaxin [Methocarbamol] Nausea And Vomiting    Social History   Social History  . Marital  status: Married    Spouse name: N/A  . Number of children: N/A  . Years of education: N/A   Occupational History  . Not on file.   Social History Main Topics  . Smoking status: Never Smoker  . Smokeless tobacco: Never Used  . Alcohol use No  . Drug use: No  . Sexual activity: Not on file   Other Topics Concern  . Not on file   Social History Narrative  . No narrative on file    No family history on file.  ROS- All systems are reviewed and negative except as per the HPI above  Physical Exam: Vitals:   06/11/17 1016  BP: (!) 160/76  Pulse: 89  Weight: 249 lb (112.9 kg)  Height: 5' 2" (1.575 m)   Wt Readings from Last 3 Encounters:  06/11/17 249 lb (112.9 kg)  01/16/15 230 lb (104.3 kg)  09/09/13 253 lb (114.8 kg)    Labs: Lab Results  Component Value Date   NA 138 01/18/2015   K 4.2 01/18/2015   CL 107 01/18/2015   CO2 23 01/18/2015   GLUCOSE 160 (H) 01/18/2015   BUN 16 01/18/2015   CREATININE 0.50 01/18/2015   CALCIUM 8.3 (L) 01/18/2015   Lab Results  Component Value Date   INR 1.05 01/16/2015   No results found for: CHOL, HDL, LDLCALC, TRIG   GEN- The patient is well appearing, alert and oriented x 3 today.   Head- normocephalic, atraumatic Eyes-  Sclera clear, conjunctiva pink Ears- hearing intact Oropharynx- clear Neck- supple, no JVP Lymph- no cervical lymphadenopathy Lungs- Clear to ausculation bilaterally, normal work of breathing Heart- irregular rate and rhythm, no murmurs, rubs or gallops, PMI not laterally displaced GI- soft, NT, ND, + BS Extremities- no clubbing, cyanosis, or edema MS- no significant deformity or atrophy Skin- no rash or lesion Psych- euthymic mood, full affect Neuro- strength and sensation are intact  EKG- Reviewed from surgical center with afib at 68 bpm, ekg in clinic shows afib at 89 bpm, qrs int 86 ms, qtc 457 ms    Assessment and Plan: 1. Afib, new onset, asymptomatic General education re afib Printed  info given Chadsvasc score of 4(female, htn, DM, age) No bleeding risks Discussion re anticoagulation options Will Rx eliquis 5 mg bid  Stop asa Bleeding precautions discussion Will not place pt on rate control at this time as she is rate controlled and I think she may convert on her own Echo  F/u here after echo Will refer to cardiology, pt prefers Dr. Wyvonnia Dusky C. Megen Madewell, [facility] [address] [place], [place] [address] [phone]

## 2017-06-11 NOTE — Patient Instructions (Signed)
Your physician has recommended you make the following change in your medication:  1)Stop Aspirin 2)Start Eliquis 5mg twice a day  

## 2017-06-23 ENCOUNTER — Ambulatory Visit (HOSPITAL_COMMUNITY)
Admission: RE | Admit: 2017-06-23 | Discharge: 2017-06-23 | Disposition: A | Discharge status: Home / Self Care | Payer: BLUE CROSS/BLUE SHIELD | Source: Ambulatory Visit | Attending: Nurse Practitioner | Admitting: Nurse Practitioner

## 2017-06-23 DIAGNOSIS — I480 Paroxysmal atrial fibrillation: Secondary | ICD-10-CM | POA: Diagnosis not present

## 2017-06-23 DIAGNOSIS — I081 Rheumatic disorders of both mitral and tricuspid valves: Secondary | ICD-10-CM | POA: Insufficient documentation

## 2017-06-23 HISTORY — PX: TRANSTHORACIC ECHOCARDIOGRAM: SHX275

## 2017-06-23 NOTE — Progress Notes (Signed)
  Echocardiogram 2D Echocardiogram has been performed.  Yuniel Blaney T Ytzel Gubler 06/23/2017, 8:56 AM

## 2017-06-25 ENCOUNTER — Encounter (HOSPITAL_COMMUNITY): Payer: Self-pay | Admitting: Nurse Practitioner

## 2017-06-25 ENCOUNTER — Ambulatory Visit (HOSPITAL_COMMUNITY)
Admission: RE | Admit: 2017-06-25 | Discharge: 2017-06-25 | Disposition: A | Discharge status: Home / Self Care | Payer: BLUE CROSS/BLUE SHIELD | Source: Ambulatory Visit | Attending: Nurse Practitioner | Admitting: Nurse Practitioner

## 2017-06-25 VITALS — BP 142/74 | HR 95 | Ht 62.0 in | Wt 253.4 lb

## 2017-06-25 DIAGNOSIS — I4819 Other persistent atrial fibrillation: Secondary | ICD-10-CM

## 2017-06-25 DIAGNOSIS — I10 Essential (primary) hypertension: Secondary | ICD-10-CM | POA: Diagnosis not present

## 2017-06-25 DIAGNOSIS — I4891 Unspecified atrial fibrillation: Secondary | ICD-10-CM | POA: Insufficient documentation

## 2017-06-25 DIAGNOSIS — I481 Persistent atrial fibrillation: Secondary | ICD-10-CM

## 2017-06-25 DIAGNOSIS — Z79899 Other long term (current) drug therapy: Secondary | ICD-10-CM | POA: Insufficient documentation

## 2017-06-25 DIAGNOSIS — E663 Overweight: Secondary | ICD-10-CM | POA: Diagnosis not present

## 2017-06-25 DIAGNOSIS — E119 Type 2 diabetes mellitus without complications: Secondary | ICD-10-CM | POA: Diagnosis not present

## 2017-06-25 DIAGNOSIS — Z96641 Presence of right artificial hip joint: Secondary | ICD-10-CM | POA: Insufficient documentation

## 2017-06-25 DIAGNOSIS — Z7901 Long term (current) use of anticoagulants: Secondary | ICD-10-CM | POA: Diagnosis not present

## 2017-06-25 DIAGNOSIS — Z6841 Body Mass Index (BMI) 40.0 and over, adult: Secondary | ICD-10-CM | POA: Insufficient documentation

## 2017-06-25 DIAGNOSIS — Z7984 Long term (current) use of oral hypoglycemic drugs: Secondary | ICD-10-CM | POA: Insufficient documentation

## 2017-06-25 DIAGNOSIS — Z888 Allergy status to other drugs, medicaments and biological substances status: Secondary | ICD-10-CM | POA: Diagnosis not present

## 2017-06-25 MED ORDER — METOPROLOL SUCCINATE ER 25 MG PO TB24: 25.0000 mg | ORAL_TABLET | Freq: Every day | ORAL | 0 refills | Status: DC

## 2017-06-25 NOTE — Progress Notes (Signed)
Primary Care Physician: Reynold Bowen, MD Referring Physician: Same   Kelly [name] is a 71 y.o. female with a h/o DM, HTN,  that is in the afib clinic for evaluation as for new onset afib that was noted at finish of cataract surgery this am. Pt is not aware of arrhythmia. She states that this surgery was more painful and she felt herself getting very anxious. She was rate controlled at the surgical center and remains  rate controlled today.  She denies smoking, alcohol, or excessive caffeine. Husband denies pt has apneic spells, does admit she has some snoring. She is sedentary, overweight. Has a knee issue that she plans to have surgery for in a about a year. She has a chadsvasc score of 4.  F/u in afib clinic. Unfortunately, she remains in afib but fortunately is asymptomatic. Did not start BB at last visit because asymptomatic and HR was better controlled but will start BB today with v rate of 95 bpm. She is 2 weeks into anticoagulation. Cardioversion discussed with pt today but with recent echo showing mod to severely  enlarged left atrium, am questioning how easy it would be to return and maintain SR.  Today, she denies symptoms of palpitations, chest pain, shortness of breath, orthopnea, PND, lower extremity edema, dizziness, presyncope, syncope, or neurologic sequela. The patient is tolerating medications without difficulties and is otherwise without complaint today.   Past Medical History:  Diagnosis Date  . Arthritis   . Diabetes mellitus without complication ([place])   . Heart murmur   . Hypertension    Past Surgical History:  Procedure Laterality Date  . FEMUR IM NAIL Right 01/16/2015   Procedure: INTRAMEDULLARY (IM) RETROGRADE FEMORAL NAILING;  Surgeon: Gearlean Alf, MD;  Location: [facility];  Service: Orthopedics;  Laterality: Right;  . I&D EXTREMITY Right 08/15/2013   Procedure: INCISION AND DRAINAGE RIGHT HIP INFECTION (PREVIOUS RIGHT TOTAL HIP REPLACEMENT);  Surgeon: Gearlean Alf, MD;  Location: [facility];  Service: Orthopedics;  Laterality: Right;  . I&D EXTREMITY Right 09/12/2013   Procedure: IRRIGATION AND DEBRIDEMENT RIGHT  LATERAL THIGH;  Surgeon: Gearlean Alf, MD;  Location: [facility];  Service: Orthopedics;  Laterality: Right;  . INCISION AND DRAINAGE HIP Right 09/09/2013   Procedure: IRRIGATION AND DEBRIDEMENT HIP;  Surgeon: Augustin Schooling, MD;  Location: [facility];  Service: Orthopedics;  Laterality: Right;  . TOTAL HIP ARTHROPLASTY Right 07/07/2013   Procedure: RIGHT TOTAL HIP ARTHROPLASTY;  Surgeon: Gearlean Alf, MD;  Location: [facility];  Service: Orthopedics;  Laterality: Right;    Current Outpatient Prescriptions  Medication Sig Dispense Refill  . apixaban (ELIQUIS) 5 MG TABS tablet Take 1 tablet (5 mg total) by mouth 2 (two) times daily. 60 tablet 6  . Aspirin Buf,AlHyd-MgHyd-CaCar, (ASCRIPTIN) 325 MG TABS Take 325 mg by mouth 2 (two) times daily. Take twice a day for three weeks and then reduce back to 81 mg Aspirin daily 42 each 0  . Cholecalciferol (HM VITAMIN D3) 4000 units CAPS Take by mouth.    . cyclobenzaprine (FLEXERIL) 5 MG tablet Take 1 tablet (5 mg total) by mouth 3 (three) times daily as needed for muscle spasms. 90 tablet 0  . Multiple Vitamins-Minerals (ICAPS AREDS 2 PO) Take by mouth.    . oxyCODONE (OXY IR/ROXICODONE) 5 MG immediate release tablet Take 1-3 tablets (5-15 mg total) by mouth every 3 (three) hours as needed for moderate pain, severe pain or breakthrough pain. 90 tablet 0  .  pioglitazone-metformin (ACTOPLUS MET) 15-850 MG per tablet Take 1 tablet by mouth every evening.    . ramipril (ALTACE) 10 MG capsule Take 20 mg by mouth every evening.    . simvastatin (ZOCOR) 40 MG tablet Take 40 mg by mouth every evening.    . Teriparatide, Recombinant, (FORTEO) 600 MCG/2.4ML SOLN Inject 20 mcg into the skin.    . Vitamin D, Ergocalciferol, (DRISDOL) 50000 units CAPS capsule TK ONE C PO Q WEEK  2  . metoprolol succinate (TOPROL XL) 25  MG 24 hr tablet Take 1 tablet (25 mg total) by mouth daily. 90 tablet 0   No current facility-administered medications for this encounter.     Allergies  Allergen Reactions  . Tape Other (See Comments)    Tears skin  . Robaxin [Methocarbamol] Nausea And Vomiting    Social History   Social History  . Marital status: Married    Spouse name: N/A  . Number of children: N/A  . Years of education: N/A   Occupational History  . Not on file.   Social History Main Topics  . Smoking status: Never Smoker  . Smokeless tobacco: Never Used  . Alcohol use No  . Drug use: No  . Sexual activity: Not on file   Other Topics Concern  . Not on file   Social History Narrative  . No narrative on file    No family history on file.  ROS- All systems are reviewed and negative except as per the HPI above  Physical Exam: Vitals:   06/25/17 0836  BP: (!) 142/74  Pulse: 95  Weight: 253 lb 6.4 oz (114.9 kg)  Height: _0  (1.575 m)   Wt Readings from Last 3 Encounters:  06/25/17 253 lb 6.4 oz (114.9 kg)  06/11/17 249 lb (112.9 kg)  01/16/15 230 lb (104.3 kg)    Labs: Lab Results  Component Value Date   NA 138 01/18/2015   K 4.2 01/18/2015   CL 107 01/18/2015   CO2 23 01/18/2015   GLUCOSE 160 (H) 01/18/2015   BUN 16 01/18/2015   CREATININE 0.50 01/18/2015   CALCIUM 8.3 (L) 01/18/2015   Lab Results  Component Value Date   INR 1.05 01/16/2015   No results found for: CHOL, HDL, LDLCALC, TRIG   GEN- The patient is well appearing, alert and oriented x 3 today.   Head- normocephalic, atraumatic Eyes-  Sclera clear, conjunctiva pink Ears- hearing intact Oropharynx- clear Neck- supple, no JVP Lymph- no cervical lymphadenopathy Lungs- Clear to ausculation bilaterally, normal work of breathing Heart- irregular rate and rhythm, no murmurs, rubs or gallops, PMI not laterally displaced GI- soft, NT, ND, + BS Extremities- no clubbing, cyanosis, or edema MS- no significant  deformity or atrophy Skin- no rash or lesion Psych- euthymic mood, full affect Neuro- strength and sensation are intact  EKG-afib at 95 bpm Echo-    Assessment and Plan: 1. Afib, new onset, asymptomatic Chadsvasc score of 4(female, htn, DM, age), has been on eliquis 5 mg bid x 2 weeks today, no bleeding issues Off asa Will start Toprol ER 25 mg at hs for better rate control Echo reviewed with pt Return in one week to further discuss/schedule cardioversion  Pending establishing with Dr. Wyvonnia Dusky C. Keilin Gamboa, [facility] [address] [place],  [address] [phone]

## 2017-06-25 NOTE — Patient Instructions (Signed)
START Metoprolol XL 25 mg, 1 tablet every evening.

## 2017-07-06 ENCOUNTER — Ambulatory Visit (HOSPITAL_COMMUNITY)
Admission: RE | Admit: 2017-07-06 | Discharge: 2017-07-06 | Disposition: A | Discharge status: Home / Self Care | Payer: BLUE CROSS/BLUE SHIELD | Source: Ambulatory Visit | Attending: Nurse Practitioner | Admitting: Nurse Practitioner

## 2017-07-06 ENCOUNTER — Encounter (HOSPITAL_COMMUNITY): Payer: Self-pay | Admitting: Nurse Practitioner

## 2017-07-06 VITALS — BP 142/78 | HR 90 | Ht 62.0 in | Wt 251.2 lb

## 2017-07-06 DIAGNOSIS — E119 Type 2 diabetes mellitus without complications: Secondary | ICD-10-CM | POA: Insufficient documentation

## 2017-07-06 DIAGNOSIS — I4891 Unspecified atrial fibrillation: Secondary | ICD-10-CM | POA: Diagnosis not present

## 2017-07-06 DIAGNOSIS — Z79899 Other long term (current) drug therapy: Secondary | ICD-10-CM | POA: Insufficient documentation

## 2017-07-06 DIAGNOSIS — Z7984 Long term (current) use of oral hypoglycemic drugs: Secondary | ICD-10-CM | POA: Insufficient documentation

## 2017-07-06 DIAGNOSIS — Z96641 Presence of right artificial hip joint: Secondary | ICD-10-CM | POA: Diagnosis not present

## 2017-07-06 DIAGNOSIS — I10 Essential (primary) hypertension: Secondary | ICD-10-CM | POA: Insufficient documentation

## 2017-07-06 DIAGNOSIS — I481 Persistent atrial fibrillation: Secondary | ICD-10-CM | POA: Diagnosis not present

## 2017-07-06 DIAGNOSIS — Z888 Allergy status to other drugs, medicaments and biological substances status: Secondary | ICD-10-CM | POA: Diagnosis not present

## 2017-07-06 DIAGNOSIS — Z7982 Long term (current) use of aspirin: Secondary | ICD-10-CM | POA: Diagnosis not present

## 2017-07-06 DIAGNOSIS — I4819 Other persistent atrial fibrillation: Secondary | ICD-10-CM

## 2017-07-06 DIAGNOSIS — Z7901 Long term (current) use of anticoagulants: Secondary | ICD-10-CM | POA: Diagnosis not present

## 2017-07-06 NOTE — Progress Notes (Signed)
Primary Care Physician: Reynold Bowen, MD Referring Physician: Same   Kelly [name] is a 71 y.o. female with a h/o DM, HTN,  that is in the afib clinic for evaluation as for new onset afib that was noted at finish of cataract surgery. Pt is not aware of arrhythmia. She states that this surgery was more painful and she felt herself getting very anxious. She was rate controlled at the surgical center and remains  rate controlled today.  She denies smoking, alcohol, or excessive caffeine. Husband denies pt has apneic spells, does admit she has some snoring. She is sedentary, overweight. Has a knee issue that she plans to have surgery for in a about a year. She has a chadsvasc score of 4.  F/u in afib clinic. Unfortunately, she remains in afib but fortunately is asymptomatic. Did not start BB at last visit because asymptomatic and HR was better controlled but will start BB today with v rate of 95 bpm. She is 2 weeks into anticoagulation. Cardioversion discussed with pt today but with recent echo showing mod to severely  enlarged left atrium, am questioning how easy it would be to return and maintain SR with cardioversion alone.  F/u in afib clinic 7/30. She has been started on toprol for rate control. Readings at home show heart rate in the 70/80's. She still states that she does not see any difference in how she feels and does not want to pursue cardioversion. She continues on eliquis 5 mg bid without any issues.  Today, she denies symptoms of palpitations, chest pain, shortness of breath, orthopnea, PND, lower extremity edema, dizziness, presyncope, syncope, or neurologic sequela. The patient is tolerating medications without difficulties and is otherwise without complaint today.   Past Medical History:  Diagnosis Date  . Arthritis   . Diabetes mellitus without complication ([place])   . Heart murmur   . Hypertension    Past Surgical History:  Procedure Laterality Date  . FEMUR IM NAIL  Right 01/16/2015   Procedure: INTRAMEDULLARY (IM) RETROGRADE FEMORAL NAILING;  Surgeon: Gearlean Alf, MD;  Location: [facility];  Service: Orthopedics;  Laterality: Right;  . I&D EXTREMITY Right 08/15/2013   Procedure: INCISION AND DRAINAGE RIGHT HIP INFECTION (PREVIOUS RIGHT TOTAL HIP REPLACEMENT);  Surgeon: Gearlean Alf, MD;  Location: [facility];  Service: Orthopedics;  Laterality: Right;  . I&D EXTREMITY Right 09/12/2013   Procedure: IRRIGATION AND DEBRIDEMENT RIGHT  LATERAL THIGH;  Surgeon: Gearlean Alf, MD;  Location: [facility];  Service: Orthopedics;  Laterality: Right;  . INCISION AND DRAINAGE HIP Right 09/09/2013   Procedure: IRRIGATION AND DEBRIDEMENT HIP;  Surgeon: Augustin Schooling, MD;  Location: [facility];  Service: Orthopedics;  Laterality: Right;  . TOTAL HIP ARTHROPLASTY Right 07/07/2013   Procedure: RIGHT TOTAL HIP ARTHROPLASTY;  Surgeon: Gearlean Alf, MD;  Location: [facility];  Service: Orthopedics;  Laterality: Right;    Current Outpatient Prescriptions  Medication Sig Dispense Refill  . apixaban (ELIQUIS) 5 MG TABS tablet Take 1 tablet (5 mg total) by mouth 2 (two) times daily. 60 tablet 6  . Aspirin Buf,AlHyd-MgHyd-CaCar, (ASCRIPTIN) 325 MG TABS Take 325 mg by mouth 2 (two) times daily. Take twice a day for three weeks and then reduce back to 81 mg Aspirin daily 42 each 0  . Cholecalciferol (HM VITAMIN D3) 4000 units CAPS Take by mouth.    . cyclobenzaprine (FLEXERIL) 5 MG tablet Take 1 tablet (5 mg total) by mouth 3 (three) times daily as  needed for muscle spasms. 90 tablet 0  . metoprolol succinate (TOPROL XL) 25 MG 24 hr tablet Take 1 tablet (25 mg total) by mouth daily. 90 tablet 0  . Multiple Vitamins-Minerals (ICAPS AREDS 2 PO) Take by mouth.    . oxyCODONE (OXY IR/ROXICODONE) 5 MG immediate release tablet Take 1-3 tablets (5-15 mg total) by mouth every 3 (three) hours as needed for moderate pain, severe pain or breakthrough pain. 90 tablet 0  . pioglitazone-metformin (ACTOPLUS  MET) 15-850 MG per tablet Take 1 tablet by mouth every evening.    . ramipril (ALTACE) 10 MG capsule Take 20 mg by mouth every evening.    . simvastatin (ZOCOR) 40 MG tablet Take 40 mg by mouth every evening.    . Teriparatide, Recombinant, (FORTEO) 600 MCG/2.4ML SOLN Inject 20 mcg into the skin.    . Vitamin D, Ergocalciferol, (DRISDOL) 50000 units CAPS capsule TK ONE C PO Q WEEK  2   No current facility-administered medications for this encounter.     Allergies  Allergen Reactions  . Tape Other (See Comments)    Tears skin  . Robaxin [Methocarbamol] Nausea And Vomiting    Social History   Social History  . Marital status: Married    Spouse name: N/A  . Number of children: N/A  . Years of education: N/A   Occupational History  . Not on file.   Social History Main Topics  . Smoking status: Never Smoker  . Smokeless tobacco: Never Used  . Alcohol use No  . Drug use: No  . Sexual activity: Not on file   Other Topics Concern  . Not on file   Social History Narrative  . No narrative on file    No family history on file.  ROS- All systems are reviewed and negative except as per the HPI above  Physical Exam: Vitals:   07/06/17 0832  BP: (!) 142/78  Pulse: 90  Weight: 251 lb 3.2 oz (113.9 kg)  Height: '5\' 2"'  (1.575 m)   Wt Readings from Last 3 Encounters:  07/06/17 251 lb 3.2 oz (113.9 kg)  06/25/17 253 lb 6.4 oz (114.9 kg)  06/11/17 249 lb (112.9 kg)    Labs: Lab Results  Component Value Date   NA 138 01/18/2015   K 4.2 01/18/2015   CL 107 01/18/2015   CO2 23 01/18/2015   GLUCOSE 160 (H) 01/18/2015   BUN 16 01/18/2015   CREATININE 0.50 01/18/2015   CALCIUM 8.3 (L) 01/18/2015   Lab Results  Component Value Date   INR 1.05 01/16/2015   No results found for: CHOL, HDL, LDLCALC, TRIG   GEN- The patient is well appearing, alert and oriented x 3 today.   Head- normocephalic, atraumatic Eyes-  Sclera clear, conjunctiva pink Ears- hearing  intact Oropharynx- clear Neck- supple, no JVP Lymph- no cervical lymphadenopathy Lungs- Clear to ausculation bilaterally, normal work of breathing Heart- irregular rate and rhythm, no murmurs, rubs or gallops, PMI not laterally displaced GI- soft, NT, ND, + BS Extremities- no clubbing, cyanosis, or edema MS- no significant deformity or atrophy Skin- no rash or lesion Psych- euthymic mood, full affect Neuro- strength and sensation are intact  EKG-afib at 95 bpm Echo-Study Conclusions  - Left ventricle: The cavity size was normal. There was moderate   concentric hypertrophy. Systolic function was normal. The   estimated ejection fraction was in the range of 60% to 65%. Wall   motion was normal; there were no regional wall motion  abnormalities. The study was not technically sufficient to allow   evaluation of LV diastolic dysfunction due to atrial   fibrillation. - Aortic valve: There was no regurgitation. - Aortic root: The aortic root was normal in size. - Ascending aorta: The ascending aorta was normal in size. - Mitral valve: Calcified annulus. Mildly thickened leaflets .   There was mild regurgitation. - Left atrium: The atrium was moderately to severely dilated. - Right ventricle: The cavity size was normal. Wall thickness was   normal. Systolic function was normal. - Tricuspid valve: There was mild regurgitation. - Pulmonary arteries: Systolic pressure was moderately increased.   PA peak pressure: 46 mm Hg (S). - Inferior vena cava: The vessel was normal in size. - Pericardium, extracardiac: There was no pericardial effusion.     Assessment and Plan: 1. Afib, new onset, asymptomatic Chadsvasc score of 4(female, htn, DM, age), has been on eliquis 5 mg bid x 3 weeks today, no bleeding issues Off asa Continue Toprol ER 25 mg at hs for better rate control Echo reviewed with pt Pt still adamantly states that she feels no different in afib and does not want to proceed  with cardioversion but continue in rate control.  F/u in afib clinic as needed Pending establishing with Dr. Claiborne Billings in the next 3 months  Geroge Baseman. [place], [facility] [address] [place], [place] [address] [phone]

## 2018-02-19 ENCOUNTER — Ambulatory Visit: Payer: Self-pay | Admitting: Orthopedic Surgery

## 2018-03-15 NOTE — Progress Notes (Signed)
Surgical clearance Dr Forde Dandy on chart   EKG 07-06-17 epic   ECHO 06-23-17 epic

## 2018-03-15 NOTE — Patient Instructions (Addendum)
Kelly [name]  03/15/2018   Your procedure is scheduled on: 03-24-18   Report to [facility] Main  Entrance    Report to admitting at 1:30PM   Call this number if you have problems the morning of surgery [phone]   Remember: Do not eat food After Midnight. You may have clear liquids from midnight until 10AM day of surgery. Nothing by mouth after 10am !     Take these medicines the morning of surgery with A SIP OF WATER: none                                You may not have any metal on your body including hair pins and              piercings  Do not wear jewelry, make-up, lotions, powders or perfumes, deodorant             Do not wear nail polish.  Do not shave  48 hours prior to surgery.              Do not bring valuables to the hospital. [place].  Contacts, dentures or bridgework may not be worn into surgery.  Leave suitcase in the car. After surgery it may be brought to your room.                  Please read over the following fact sheets you were given: _____________________________________________________________________    CLEAR LIQUID DIET   Foods Allowed                                                                     Foods Excluded  Coffee and tea, regular and decaf                             liquids that you cannot  Plain Jell-O in any flavor                                             see through such as: Fruit ices (not with fruit pulp)                                     milk, soups, orange juice  Iced Popsicles                                    All solid food Carbonated beverages, regular and diet                                    Cranberry, grape and apple juices Sports  drinks like Gatorade Lightly seasoned clear broth or consume(fat free) Sugar, honey syrup  Sample Menu Breakfast                                Lunch                                      Supper Cranberry juice                    Beef broth                            Chicken broth Jell-O                                     Grape juice                           Apple juice Coffee or tea                        Jell-O                                      Popsicle                                                Coffee or tea                        Coffee or tea  _____________________________________________________________________  How to Manage Your Diabetes Before and After Surgery  Why is it important to control my blood sugar before and after surgery? . Improving blood sugar levels before and after surgery helps healing and can limit problems. . A way of improving blood sugar control is eating a healthy diet by: o  Eating less sugar and carbohydrates o  Increasing activity/exercise o  Talking with your doctor about reaching your blood sugar goals . High blood sugars (greater than 180 mg/dL) can raise your risk of infections and slow your recovery, so you will need to focus on controlling your diabetes during the weeks before surgery. . Make sure that the doctor who takes care of your diabetes knows about your planned surgery including the date and location.  How do I manage my blood sugar before surgery? . Check your blood sugar at least 4 times a day, starting 2 days before surgery, to make sure that the level is not too high or low. o Check your blood sugar the morning of your surgery when you wake up and every 2 hours until you get to the Short Stay unit. . If your blood sugar is less than 70 mg/dL, you will need to treat for low blood sugar: o Do not take insulin. o Treat a low blood sugar (less than 70 mg/dL) with  cup of clear juice (cranberry or apple), 4 glucose tablets, OR glucose gel. o Recheck blood sugar in 15 minutes  after treatment (to make sure it is greater than 70 mg/dL). If your blood sugar is not greater than 70 mg/dL on recheck, call [phone] for  further instructions. . Report your blood sugar to the short stay nurse when you get to Short Stay.  . If you are admitted to the hospital after surgery: o Your blood sugar will be checked by the staff and you will probably be given insulin after surgery (instead of oral diabetes medicines) to make sure you have good blood sugar levels. o The goal for blood sugar control after surgery is 80-180 mg/dL.   WHAT DO I DO ABOUT MY DIABETES MEDICATION?   . THE DAY BEFORE SURGERY, take Pioglitazone-Metformin as normal       . THE MORNING OF SURGERY, DO NOT TAKE ANY DIABETIC MEDICATIONS !   Patient Signature:  Date:   Nurse Signature:  Date:   Reviewed and Endorsed by [facility] Patient Education Committee, August 2015   [facility] - Preparing for Surgery Before surgery, you can play an important role.  Because skin is not sterile, your skin needs to be as free of germs as possible.  You can reduce the number of germs on your skin by washing with CHG (chlorahexidine gluconate) soap before surgery.  CHG is an antiseptic cleaner which kills germs and bonds with the skin to continue killing germs even after washing. Please DO NOT use if you have an allergy to CHG or antibacterial soaps.  If your skin becomes reddened/irritated stop using the CHG and inform your nurse when you arrive at Short Stay. Do not shave (including legs and underarms) for at least 48 hours prior to the first CHG shower.  You may shave your face/neck. Please follow these instructions carefully:  1.  Shower with CHG Soap the night before surgery and the  morning of Surgery.  2.  If you choose to wash your hair, wash your hair first as usual with your  normal  shampoo.  3.  After you shampoo, rinse your hair and body thoroughly to remove the  shampoo.                           4.  Use CHG as you would any other liquid soap.  You can apply chg directly  to the skin and wash                       Gently with a scrungie or  clean washcloth.  5.  Apply the CHG Soap to your body ONLY FROM THE NECK DOWN.   Do not use on face/ open                           Wound or open sores. Avoid contact with eyes, ears mouth and genitals (private parts).                       Wash face,  Genitals (private parts) with your normal soap.             6.  Wash thoroughly, paying special attention to the area where your surgery  will be performed.  7.  Thoroughly rinse your body with warm water from the neck down.  8.  DO NOT shower/wash with your normal soap after using and rinsing off  the CHG Soap.  9.  Pat yourself dry with a clean towel.            10.  Wear clean pajamas.            11.  Place clean sheets on your bed the night of your first shower and do not  sleep with pets. Day of Surgery : Do not apply any lotions/deodorants the morning of surgery.  Please wear clean clothes to the hospital/surgery center.  FAILURE TO FOLLOW THESE INSTRUCTIONS MAY RESULT IN THE CANCELLATION OF YOUR SURGERY PATIENT SIGNATURE_________________________________  NURSE SIGNATURE__________________________________  ________________________________________________________________________   [name]  An incentive spirometer is a tool that can help keep your lungs clear and active. This tool measures how well you are filling your lungs with each breath. Taking long deep breaths may help reverse or decrease the chance of developing breathing (pulmonary) problems (especially infection) following:  A long period of time when you are unable to move or be active. BEFORE THE PROCEDURE   If the spirometer includes an indicator to show your best effort, your nurse or respiratory therapist will set it to a desired goal.  If possible, sit up straight or lean slightly forward. Try not to slouch.  Hold the incentive spirometer in an upright position. INSTRUCTIONS FOR USE  1. Sit on the edge of your bed if possible, or sit up as  far as you can in bed or on a chair. 2. Hold the incentive spirometer in an upright position. 3. Breathe out normally. 4. Place the mouthpiece in your mouth and seal your lips tightly around it. 5. Breathe in slowly and as deeply as possible, raising the piston or the ball toward the top of the column. 6. Hold your breath for 3-5 seconds or for as long as possible. Allow the piston or ball to fall to the bottom of the column. 7. Remove the mouthpiece from your mouth and breathe out normally. 8. Rest for a few seconds and repeat Steps 1 through 7 at least 10 times every 1-2 hours when you are awake. Take your time and take a few normal breaths between deep breaths. 9. The spirometer may include an indicator to show your best effort. Use the indicator as a goal to work toward during each repetition. 10. After each set of 10 deep breaths, practice coughing to be sure your lungs are clear. If you have an incision (the cut made at the time of surgery), support your incision when coughing by placing a pillow or rolled up towels firmly against it. Once you are able to get out of bed, walk around indoors and cough well. You may stop using the incentive spirometer when instructed by your caregiver.  RISKS AND COMPLICATIONS  Take your time so you do not get dizzy or light-headed.  If you are in pain, you may need to take or ask for pain medication before doing incentive spirometry. It is harder to take a deep breath if you are having pain. AFTER USE  Rest and breathe slowly and easily.  It can be helpful to keep track of a log of your progress. Your caregiver can provide you with a simple table to help with this. If you are using the spirometer at home, follow these instructions: [place] IF:   You are having difficultly using the spirometer.  You have trouble using the spirometer as often as instructed.  Your pain medication is not giving enough relief while using the spirometer.  You  develop fever of 100.5 F (38.1 C) or higher. SEEK IMMEDIATE MEDICAL CARE IF:   You cough up bloody sputum that had not been present before.  You develop fever of 102 F (38.9 C) or greater.  You develop worsening pain at or near the incision site. MAKE SURE YOU:   Understand these instructions.  Will watch your condition.  Will get help right away if you are not doing well or get worse. Document Released: 04/06/2007 Document Revised: 02/16/2012 Document Reviewed: 06/07/2007 [facility] Patient Information 2014 [place], [place].   ________________________________________________________________________

## 2018-03-16 ENCOUNTER — Encounter (HOSPITAL_COMMUNITY)
Admission: RE | Admit: 2018-03-16 | Discharge: 2018-03-16 | Disposition: A | Discharge status: Home / Self Care | Payer: Worker's Compensation | Source: Ambulatory Visit | Attending: Orthopedic Surgery | Admitting: Orthopedic Surgery

## 2018-03-16 ENCOUNTER — Other Ambulatory Visit: Payer: Self-pay

## 2018-03-16 ENCOUNTER — Encounter (HOSPITAL_COMMUNITY): Payer: Self-pay

## 2018-03-16 ENCOUNTER — Encounter (INDEPENDENT_AMBULATORY_CARE_PROVIDER_SITE_OTHER): Payer: Self-pay

## 2018-03-16 DIAGNOSIS — I4891 Unspecified atrial fibrillation: Secondary | ICD-10-CM | POA: Insufficient documentation

## 2018-03-16 DIAGNOSIS — R9431 Abnormal electrocardiogram [ECG] [EKG]: Secondary | ICD-10-CM | POA: Diagnosis not present

## 2018-03-16 DIAGNOSIS — Z01810 Encounter for preprocedural cardiovascular examination: Secondary | ICD-10-CM | POA: Diagnosis present

## 2018-03-16 DIAGNOSIS — Z01812 Encounter for preprocedural laboratory examination: Secondary | ICD-10-CM | POA: Diagnosis not present

## 2018-03-16 HISTORY — DX: Unspecified atrial fibrillation: I48.91

## 2018-03-16 LAB — HEMOGLOBIN A1C
HEMOGLOBIN A1C: 6.6 % — AB (ref 4.8–5.6)
Mean Plasma Glucose: 142.72 mg/dL

## 2018-03-16 LAB — CBC
HCT: 37.8 % (ref 36.0–46.0)
HEMOGLOBIN: 12.2 g/dL (ref 12.0–15.0)
MCH: 29.8 pg (ref 26.0–34.0)
MCHC: 32.3 g/dL (ref 30.0–36.0)
MCV: 92.4 fL (ref 78.0–100.0)
Platelets: 271 10*3/uL (ref 150–400)
RBC: 4.09 MIL/uL (ref 3.87–5.11)
RDW: 15 % (ref 11.5–15.5)
WBC: 8.1 10*3/uL (ref 4.0–10.5)

## 2018-03-16 LAB — COMPREHENSIVE METABOLIC PANEL
ALK PHOS: 98 U/L (ref 38–126)
ALT: 17 U/L (ref 14–54)
AST: 23 U/L (ref 15–41)
Albumin: 4 g/dL (ref 3.5–5.0)
Anion gap: 9 (ref 5–15)
BUN: 15 mg/dL (ref 6–20)
CALCIUM: 10.1 mg/dL (ref 8.9–10.3)
CO2: 27 mmol/L (ref 22–32)
CREATININE: 0.58 mg/dL (ref 0.44–1.00)
Chloride: 106 mmol/L (ref 101–111)
Glucose, Bld: 154 mg/dL — ABNORMAL HIGH (ref 65–99)
Potassium: 4.4 mmol/L (ref 3.5–5.1)
Sodium: 142 mmol/L (ref 135–145)
Total Bilirubin: 1.1 mg/dL (ref 0.3–1.2)
Total Protein: 7.6 g/dL (ref 6.5–8.1)

## 2018-03-16 LAB — GLUCOSE, CAPILLARY: Glucose-Capillary: 145 mg/dL — ABNORMAL HIGH (ref 65–99)

## 2018-03-16 NOTE — Progress Notes (Signed)
RN consulted with anesthesia regarding clearance forms. On clearance form sent by [facility], it reads " Per [facility] stop 3 days prior, restart 3 days after" and the note appears to be signed by the patients PCP Dr Forde Dandy . However on another clearance form also sent by [facility] orthoped[facility], it reads " Pt. can hold Eliquis 2 days prior to surgery & resume as soon as possible after surgery , if hemodynamically stable. This is not giving cardiac clearance , if needed she will need to be seen by general cardiology"  and is signed by Roderic Palau ANP-C. Per Dr Lissa Hoard, patient does not need to be cleared by cardiology. Also per Dr Lissa Hoard , the two clearance  forms display a discrepancy in the Eliquis instructions which needs to be clarified prior to surgery.  RN called and LVMM for [employer] detailing that anesthesia is requesting that the Eliquis instructions be clarified for surgery as there is a discrepancy in the PCPs and [facility] instructions.

## 2018-03-19 NOTE — Progress Notes (Signed)
RN off on 03-18-18. However colleague received phone call from [employer] (scheduler for Dr Wynelle Link).Per note left on patient charts by colleague , Velvet states that Dr Wynelle Link is going by the [facility] recommendation for Eliquis. Please to refer  clearance form on patient's chart to review [facility]'s Eliquis recommendations.

## 2018-03-24 ENCOUNTER — Ambulatory Visit (HOSPITAL_COMMUNITY): Payer: Worker's Compensation | Admitting: Anesthesiology

## 2018-03-24 ENCOUNTER — Encounter (HOSPITAL_COMMUNITY)
Admission: RE | Disposition: A | Discharge status: Home / Self Care | Payer: Self-pay | Source: Ambulatory Visit | Attending: Orthopedic Surgery

## 2018-03-24 ENCOUNTER — Ambulatory Visit (HOSPITAL_COMMUNITY): Payer: Worker's Compensation

## 2018-03-24 ENCOUNTER — Other Ambulatory Visit: Payer: Self-pay

## 2018-03-24 ENCOUNTER — Observation Stay (HOSPITAL_COMMUNITY)
Admission: RE | Admit: 2018-03-24 | Discharge: 2018-03-25 | Disposition: A | Discharge status: Home / Self Care | Payer: Worker's Compensation | Source: Ambulatory Visit | Attending: Orthopedic Surgery | Admitting: Orthopedic Surgery

## 2018-03-24 ENCOUNTER — Encounter (HOSPITAL_COMMUNITY): Payer: Self-pay | Admitting: *Deleted

## 2018-03-24 DIAGNOSIS — Z79899 Other long term (current) drug therapy: Secondary | ICD-10-CM | POA: Insufficient documentation

## 2018-03-24 DIAGNOSIS — I10 Essential (primary) hypertension: Secondary | ICD-10-CM | POA: Diagnosis not present

## 2018-03-24 DIAGNOSIS — Z96641 Presence of right artificial hip joint: Secondary | ICD-10-CM | POA: Insufficient documentation

## 2018-03-24 DIAGNOSIS — Z472 Encounter for removal of internal fixation device: Secondary | ICD-10-CM | POA: Diagnosis not present

## 2018-03-24 DIAGNOSIS — M1710 Unilateral primary osteoarthritis, unspecified knee: Secondary | ICD-10-CM | POA: Diagnosis present

## 2018-03-24 DIAGNOSIS — M1711 Unilateral primary osteoarthritis, right knee: Secondary | ICD-10-CM | POA: Diagnosis not present

## 2018-03-24 DIAGNOSIS — Z6841 Body Mass Index (BMI) 40.0 and over, adult: Secondary | ICD-10-CM | POA: Insufficient documentation

## 2018-03-24 DIAGNOSIS — M25561 Pain in right knee: Secondary | ICD-10-CM | POA: Diagnosis present

## 2018-03-24 DIAGNOSIS — M179 Osteoarthritis of knee, unspecified: Secondary | ICD-10-CM | POA: Diagnosis present

## 2018-03-24 DIAGNOSIS — I4891 Unspecified atrial fibrillation: Secondary | ICD-10-CM | POA: Insufficient documentation

## 2018-03-24 DIAGNOSIS — E119 Type 2 diabetes mellitus without complications: Secondary | ICD-10-CM | POA: Diagnosis not present

## 2018-03-24 DIAGNOSIS — Z7984 Long term (current) use of oral hypoglycemic drugs: Secondary | ICD-10-CM | POA: Diagnosis not present

## 2018-03-24 DIAGNOSIS — Z969 Presence of functional implant, unspecified: Secondary | ICD-10-CM

## 2018-03-24 HISTORY — PX: HARDWARE REMOVAL: SHX979

## 2018-03-24 LAB — GLUCOSE, CAPILLARY
GLUCOSE-CAPILLARY: 138 mg/dL — AB (ref 65–99)
GLUCOSE-CAPILLARY: 140 mg/dL — AB (ref 65–99)

## 2018-03-24 SURGERY — REMOVAL, HARDWARE
Anesthesia: Spinal | Site: Knee | Laterality: Right

## 2018-03-24 MED ORDER — DEXTROSE 5 % IV SOLN
INTRAVENOUS | Status: DC | PRN
  Administered 2018-03-24: 25 ug/min via INTRAVENOUS

## 2018-03-24 MED ORDER — CEFAZOLIN SODIUM-DEXTROSE 2-4 GM/100ML-% IV SOLN
2.0000 g | INTRAVENOUS | Status: AC
  Administered 2018-03-24: 2 g via INTRAVENOUS

## 2018-03-24 MED ORDER — FENTANYL CITRATE (PF) 100 MCG/2ML IJ SOLN
INTRAMUSCULAR | Status: AC
Start: 2018-03-24 — End: 2018-03-25
  Filled 2018-03-24: qty 4

## 2018-03-24 MED ORDER — DEXAMETHASONE SODIUM PHOSPHATE 10 MG/ML IJ SOLN
10.0000 mg | Freq: Once | INTRAMUSCULAR | Status: AC
  Administered 2018-03-24: 10 mg via INTRAVENOUS

## 2018-03-24 MED ORDER — FENTANYL CITRATE (PF) 100 MCG/2ML IJ SOLN
INTRAMUSCULAR | Status: DC | PRN
  Administered 2018-03-24: 25 ug via INTRAVENOUS

## 2018-03-24 MED ORDER — PROPOFOL 10 MG/ML IV BOLUS
INTRAVENOUS | Status: AC
  Filled 2018-03-24: qty 20

## 2018-03-24 MED ORDER — SIMVASTATIN 20 MG PO TABS
40.0000 mg | ORAL_TABLET | Freq: Every evening | ORAL | Status: DC
  Administered 2018-03-24: 40 mg via ORAL
  Filled 2018-03-24: qty 2

## 2018-03-24 MED ORDER — CYCLOBENZAPRINE HCL 10 MG PO TABS
10.0000 mg | ORAL_TABLET | Freq: Three times a day (TID) | ORAL | Status: DC | PRN
  Administered 2018-03-24 – 2018-03-25 (×2): 10 mg via ORAL
  Filled 2018-03-24 (×2): qty 1

## 2018-03-24 MED ORDER — ONDANSETRON HCL 4 MG/2ML IJ SOLN: 4.0000 mg | Freq: Four times a day (QID) | INTRAMUSCULAR | Status: DC | PRN

## 2018-03-24 MED ORDER — HYDROMORPHONE HCL 1 MG/ML IJ SOLN
0.5000 mg | Freq: Once | INTRAMUSCULAR | Status: AC
  Administered 2018-03-24: 0.5 mg via INTRAVENOUS

## 2018-03-24 MED ORDER — HYDROMORPHONE HCL 1 MG/ML IJ SOLN
INTRAMUSCULAR | Status: AC
  Filled 2018-03-24: qty 1

## 2018-03-24 MED ORDER — LACTATED RINGERS IV SOLN: INTRAVENOUS | Status: DC

## 2018-03-24 MED ORDER — LACTATED RINGERS IV SOLN
INTRAVENOUS | Status: DC
  Administered 2018-03-24 (×2): via INTRAVENOUS

## 2018-03-24 MED ORDER — CHLORHEXIDINE GLUCONATE 4 % EX LIQD: 60.0000 mL | Freq: Once | CUTANEOUS | Status: DC

## 2018-03-24 MED ORDER — PROPOFOL 500 MG/50ML IV EMUL
INTRAVENOUS | Status: DC | PRN
  Administered 2018-03-24: 75 ug/kg/min via INTRAVENOUS

## 2018-03-24 MED ORDER — MIDAZOLAM HCL 5 MG/5ML IJ SOLN
INTRAMUSCULAR | Status: DC | PRN
  Administered 2018-03-24 (×2): 1 mg via INTRAVENOUS

## 2018-03-24 MED ORDER — MEPERIDINE HCL 50 MG/ML IJ SOLN: 6.2500 mg | INTRAMUSCULAR | Status: DC | PRN

## 2018-03-24 MED ORDER — METOCLOPRAMIDE HCL 5 MG PO TABS: 5.0000 mg | ORAL_TABLET | Freq: Three times a day (TID) | ORAL | Status: DC | PRN

## 2018-03-24 MED ORDER — HYDROCODONE-ACETAMINOPHEN 7.5-325 MG PO TABS
1.0000 | ORAL_TABLET | ORAL | Status: DC | PRN
  Administered 2018-03-24: 1 via ORAL
  Administered 2018-03-25 (×3): 2 via ORAL
  Filled 2018-03-24: qty 1
  Filled 2018-03-24 (×3): qty 2

## 2018-03-24 MED ORDER — ONDANSETRON HCL 4 MG/2ML IJ SOLN
INTRAMUSCULAR | Status: DC | PRN
  Administered 2018-03-24: 4 mg via INTRAVENOUS

## 2018-03-24 MED ORDER — ACETAMINOPHEN 10 MG/ML IV SOLN
1000.0000 mg | Freq: Once | INTRAVENOUS | Status: AC
  Administered 2018-03-24: 1000 mg via INTRAVENOUS
  Filled 2018-03-24: qty 100

## 2018-03-24 MED ORDER — METOCLOPRAMIDE HCL 5 MG/ML IJ SOLN: 10.0000 mg | Freq: Once | INTRAMUSCULAR | Status: DC | PRN

## 2018-03-24 MED ORDER — HYDROMORPHONE HCL 1 MG/ML IJ SOLN
0.5000 mg | INTRAMUSCULAR | Status: AC | PRN
  Administered 2018-03-24 (×2): 0.5 mg via INTRAVENOUS

## 2018-03-24 MED ORDER — APIXABAN 5 MG PO TABS
5.0000 mg | ORAL_TABLET | Freq: Two times a day (BID) | ORAL | Status: DC
  Filled 2018-03-24: qty 1

## 2018-03-24 MED ORDER — TRAMADOL HCL 50 MG PO TABS
50.0000 mg | ORAL_TABLET | Freq: Four times a day (QID) | ORAL | Status: DC | PRN
  Administered 2018-03-25: 50 mg via ORAL
  Administered 2018-03-25: 100 mg via ORAL
  Filled 2018-03-24: qty 1
  Filled 2018-03-24: qty 2

## 2018-03-24 MED ORDER — RAMIPRIL 10 MG PO CAPS
20.0000 mg | ORAL_CAPSULE | Freq: Every evening | ORAL | Status: DC
  Administered 2018-03-24: 20 mg via ORAL
  Filled 2018-03-24 (×2): qty 2

## 2018-03-24 MED ORDER — ONDANSETRON HCL 4 MG PO TABS: 4.0000 mg | ORAL_TABLET | Freq: Four times a day (QID) | ORAL | Status: DC | PRN

## 2018-03-24 MED ORDER — FENTANYL CITRATE (PF) 100 MCG/2ML IJ SOLN
INTRAMUSCULAR | Status: AC
  Filled 2018-03-24: qty 2

## 2018-03-24 MED ORDER — MORPHINE SULFATE (PF) 2 MG/ML IV SOLN: 0.5000 mg | INTRAVENOUS | Status: DC | PRN

## 2018-03-24 MED ORDER — CEFAZOLIN SODIUM-DEXTROSE 2-4 GM/100ML-% IV SOLN
2.0000 g | Freq: Four times a day (QID) | INTRAVENOUS | Status: AC
  Administered 2018-03-24 – 2018-03-25 (×3): 2 g via INTRAVENOUS
  Filled 2018-03-24 (×3): qty 100

## 2018-03-24 MED ORDER — SODIUM CHLORIDE 0.9 % IV SOLN
INTRAVENOUS | Status: DC
  Administered 2018-03-24: 20:00:00 via INTRAVENOUS

## 2018-03-24 MED ORDER — ACETAMINOPHEN 325 MG PO TABS: 325.0000 mg | ORAL_TABLET | Freq: Four times a day (QID) | ORAL | Status: DC | PRN

## 2018-03-24 MED ORDER — BUPIVACAINE IN DEXTROSE 0.75-8.25 % IT SOLN
INTRATHECAL | Status: DC | PRN
  Administered 2018-03-24: 2 mL via INTRATHECAL

## 2018-03-24 MED ORDER — MIDAZOLAM HCL 2 MG/2ML IJ SOLN
INTRAMUSCULAR | Status: AC
  Filled 2018-03-24: qty 2

## 2018-03-24 MED ORDER — METOCLOPRAMIDE HCL 5 MG/ML IJ SOLN: 5.0000 mg | Freq: Three times a day (TID) | INTRAMUSCULAR | Status: DC | PRN

## 2018-03-24 MED ORDER — POVIDONE-IODINE 10 % EX SWAB: 2.0000 "application " | Freq: Once | CUTANEOUS | Status: DC

## 2018-03-24 MED ORDER — FENTANYL CITRATE (PF) 100 MCG/2ML IJ SOLN
25.0000 ug | INTRAMUSCULAR | Status: DC | PRN
  Administered 2018-03-24 (×3): 50 ug via INTRAVENOUS

## 2018-03-24 MED ORDER — DOCUSATE SODIUM 100 MG PO CAPS
100.0000 mg | ORAL_CAPSULE | Freq: Two times a day (BID) | ORAL | Status: DC
Start: 2018-03-24 — End: 2018-03-25
  Administered 2018-03-24 – 2018-03-25 (×2): 100 mg via ORAL
  Filled 2018-03-24 (×2): qty 1

## 2018-03-24 MED ORDER — HYDROCODONE-ACETAMINOPHEN 5-325 MG PO TABS: 1.0000 | ORAL_TABLET | ORAL | Status: DC | PRN

## 2018-03-24 MED ORDER — ONDANSETRON HCL 4 MG/2ML IJ SOLN
INTRAMUSCULAR | Status: AC
  Filled 2018-03-24: qty 2

## 2018-03-24 MED ORDER — PROPOFOL 500 MG/50ML IV EMUL
INTRAVENOUS | Status: DC | PRN
  Administered 2018-03-24 (×3): 20 mg via INTRAVENOUS

## 2018-03-24 MED ORDER — 0.9 % SODIUM CHLORIDE (POUR BTL) OPTIME
TOPICAL | Status: DC | PRN
  Administered 2018-03-24: 1000 mL

## 2018-03-24 SURGICAL SUPPLY — 44 items
BANDAGE ACE 6X5 VEL STRL LF (GAUZE/BANDAGES/DRESSINGS) ×2 IMPLANT
BANDAGE ESMARK 6X9 LF (GAUZE/BANDAGES/DRESSINGS) ×1 IMPLANT
BNDG CMPR 9X6 STRL LF SNTH (GAUZE/BANDAGES/DRESSINGS) ×1
BNDG ESMARK 6X9 LF (GAUZE/BANDAGES/DRESSINGS) ×2
COVER SURGICAL LIGHT HANDLE (MISCELLANEOUS) ×2 IMPLANT
CUFF TOURN SGL QUICK 18 (TOURNIQUET CUFF) IMPLANT
CUFF TOURN SGL QUICK 34 (TOURNIQUET CUFF)
CUFF TRNQT CYL 34X4X40X1 (TOURNIQUET CUFF) IMPLANT
DRAPE C-ARM 42X120 X-RAY (DRAPES) ×2 IMPLANT
DRAPE C-ARMOR (DRAPES) ×2 IMPLANT
DRAPE EXTREMITY T 121X128X90 (DRAPE) ×2 IMPLANT
DRAPE INCISE IOBAN 66X45 STRL (DRAPES) ×2 IMPLANT
DRAPE ORTHO SPLIT 77X108 STRL (DRAPES)
DRAPE SURG ORHT 6 SPLT 77X108 (DRAPES) IMPLANT
DRSG ADAPTIC 3X8 NADH LF (GAUZE/BANDAGES/DRESSINGS) ×2 IMPLANT
DRSG PAD ABDOMINAL 8X10 ST (GAUZE/BANDAGES/DRESSINGS) ×2 IMPLANT
DURAPREP 26ML APPLICATOR (WOUND CARE) ×2 IMPLANT
ELECT REM PT RETURN 15FT ADLT (MISCELLANEOUS) ×2 IMPLANT
GAUZE SPONGE 4X4 12PLY STRL (GAUZE/BANDAGES/DRESSINGS) ×2 IMPLANT
GLOVE BIO SURGEON STRL SZ7.5 (GLOVE) ×2 IMPLANT
GLOVE BIO SURGEON STRL SZ8 (GLOVE) ×4 IMPLANT
GLOVE BIOGEL PI IND STRL 7.5 (GLOVE) ×3 IMPLANT
GLOVE BIOGEL PI IND STRL 8 (GLOVE) ×2 IMPLANT
GLOVE BIOGEL PI INDICATOR 7.5 (GLOVE) ×3
GLOVE BIOGEL PI INDICATOR 8 (GLOVE) ×2
GOWN STRL REUS W/TWL LRG LVL3 (GOWN DISPOSABLE) ×2 IMPLANT
GOWN STRL REUS W/TWL XL LVL3 (GOWN DISPOSABLE) ×4 IMPLANT
KIT BASIN OR (CUSTOM PROCEDURE TRAY) ×2 IMPLANT
MANIFOLD NEPTUNE II (INSTRUMENTS) ×2 IMPLANT
NS IRRIG 1000ML POUR BTL (IV SOLUTION) ×2 IMPLANT
PACK TOTAL JOINT (CUSTOM PROCEDURE TRAY) ×2 IMPLANT
PADDING CAST COTTON 6X4 STRL (CAST SUPPLIES) ×4 IMPLANT
POSITIONER SURGICAL ARM (MISCELLANEOUS) ×2 IMPLANT
STAPLER VISISTAT 35W (STAPLE) IMPLANT
STRIP CLOSURE SKIN 1/2X4 (GAUZE/BANDAGES/DRESSINGS) ×2 IMPLANT
SUT MNCRL AB 4-0 PS2 18 (SUTURE) ×2 IMPLANT
SUT STRATAFIX 0 PDS 27 VIOLET (SUTURE) ×2
SUT VIC AB 0 CT1 36 (SUTURE) ×4 IMPLANT
SUT VIC AB 2-0 CT1 27 (SUTURE) ×3
SUT VIC AB 2-0 CT1 TAPERPNT 27 (SUTURE) ×3 IMPLANT
SUTURE STRATFX 0 PDS 27 VIOLET (SUTURE) ×1 IMPLANT
TOWEL OR 17X26 10 PK STRL BLUE (TOWEL DISPOSABLE) ×4 IMPLANT
UNDERPAD 30X30 (UNDERPADS AND DIAPERS) ×2 IMPLANT
WATER STERILE IRR 1000ML POUR (IV SOLUTION) ×2 IMPLANT

## 2018-03-24 NOTE — Anesthesia Procedure Notes (Signed)
Spinal  Start time: 03/24/2018 3:47 PM End time: 03/24/2018 3:54 PM Staffing Anesthesiologist: Lillia Abed, MD Performed: anesthesiologist  Preanesthetic Checklist Completed: patient identified, surgical consent, pre-op evaluation, timeout performed, IV checked, risks and benefits discussed and monitors and equipment checked Spinal Block Patient position: sitting Prep: DuraPrep Patient monitoring: heart rate, cardiac monitor, continuous pulse ox and blood pressure Approach: midline Location: L3-4 Injection technique: single-shot Needle Needle type: Pencan  Needle gauge: 24 G Needle length: 12.7 cm Needle insertion depth: 11 cm

## 2018-03-24 NOTE — Anesthesia Procedure Notes (Signed)
Date/Time: 03/24/2018 3:33 PM Performed by: Glory Buff, CRNA Oxygen Delivery Method: Simple face mask

## 2018-03-24 NOTE — Plan of Care (Signed)
Plan of care discussed.   

## 2018-03-24 NOTE — Anesthesia Preprocedure Evaluation (Addendum)
Anesthesia Evaluation  Patient identified by MRN, date of birth, ID band Patient awake    Reviewed: Allergy & Precautions, NPO status , Patient's Chart, lab work & pertinent test results  Airway Mallampati: II  TM Distance: >3 FB Neck ROM: Full    Dental no notable dental hx.    Pulmonary neg pulmonary ROS,    Pulmonary exam normal breath sounds clear to auscultation       Cardiovascular hypertension, Pt. on medications Normal cardiovascular exam+ dysrhythmias Atrial Fibrillation  Rhythm:Regular Rate:Normal     Neuro/Psych negative neurological ROS  negative psych ROS   GI/Hepatic negative GI ROS, Neg liver ROS,   Endo/Other  diabetes, Type 2Morbid obesity  Renal/GU negative Renal ROS  negative genitourinary   Musculoskeletal negative musculoskeletal ROS (+)   Abdominal   Peds negative pediatric ROS (+)  Hematology negative hematology ROS (+)   Anesthesia Other Findings   Reproductive/Obstetrics negative OB ROS                             Anesthesia Physical Anesthesia Plan  ASA: III  Anesthesia Plan: Spinal   Post-op Pain Management:    Induction:   PONV Risk Score and Plan: 2 and Ondansetron and Treatment may vary due to age or medical condition  Airway Management Planned: Simple Face Mask  Additional Equipment:   Intra-op Plan:   Post-operative Plan:   Informed Consent: I have reviewed the patients History and Physical, chart, labs and discussed the procedure including the risks, benefits and alternatives for the proposed anesthesia with the patient or authorized representative who has indicated his/her understanding and acceptance.   Dental advisory given  Plan Discussed with:   Anesthesia Plan Comments: (Off Eliquis >72 hrs)        Anesthesia Quick Evaluation

## 2018-03-24 NOTE — H&P (Signed)
CC- Kelly [name] is a 72 y.o. female who presents with right knee pain.  HPI- . Knee Pain: Patient presents with knee pain involving the  right knee. Onset of the symptoms was several years ago. Inciting event: She had a supracondylar femur fracture in2/2016 treated with a retrograde IM nail. This healed uneventfully but she eventually developed a valgus deformity at the fracture site during the healing process and healed in valgus alignment. She has had worsening knee osteoarthritis and has now failed injections of cortisone and viscosupplements. She is at a stage where she needs a knee replacement but needs to have the hardware removed first. She presents today for hardware removal right femur..  Past Medical History:  Diagnosis Date  . A-fib [facility])    patient reports she went into AFIB after cataract surgery in July 2018; reports being asymptomatic   . Arthritis    left knee arthritis   . Diabetes mellitus without complication ([place])    type 2   . Dysrhythmia   . Hypertension     Past Surgical History:  Procedure Laterality Date  . EYE SURGERY     cataract surgery ; left 04-2017, right 06-2017  . FEMUR IM NAIL Right 01/16/2015   Procedure: INTRAMEDULLARY (IM) RETROGRADE FEMORAL NAILING;  Surgeon: Gearlean Alf, MD;  Location: [facility] ORS;  Service: Orthopedics;  Laterality: Right;  . I&D EXTREMITY Right 08/15/2013   Procedure: INCISION AND DRAINAGE RIGHT HIP INFECTION (PREVIOUS RIGHT TOTAL HIP REPLACEMENT);  Surgeon: Gearlean Alf, MD;  Location: [facility];  Service: Orthopedics;  Laterality: Right;  . I&D EXTREMITY Right 09/12/2013   Procedure: IRRIGATION AND DEBRIDEMENT RIGHT  LATERAL THIGH;  Surgeon: Gearlean Alf, MD;  Location: [facility];  Service: Orthopedics;  Laterality: Right;  . INCISION AND DRAINAGE HIP Right 09/09/2013   Procedure: IRRIGATION AND DEBRIDEMENT HIP;  Surgeon: Augustin Schooling, MD;  Location: [facility];  Service: Orthopedics;  Laterality: Right;  . TOTAL HIP ARTHROPLASTY  Right 07/07/2013   Procedure: RIGHT TOTAL HIP ARTHROPLASTY;  Surgeon: Gearlean Alf, MD;  Location: [facility];  Service: Orthopedics;  Laterality: Right;    Prior to Admission medications   Medication Sig Start Date End Date Taking? Authorizing Provider  apixaban (ELIQUIS) 5 MG TABS tablet Take 1 tablet (5 mg total) by mouth 2 (two) times daily. 06/11/17  Yes Sherran Needs, NP  Cholecalciferol (VITAMIN D3) 2000 units TABS Take 2,000 Units by mouth daily.    Yes [provider]  Multiple Vitamins-Minerals (ICAPS AREDS 2 PO) Take 1 capsule by mouth 2 (two) times daily.    Yes [provider]  pioglitazone-metformin (ACTOPLUS MET) 15-850 MG per tablet Take 1 tablet by mouth every evening.   Yes [provider]  ramipril (ALTACE) 10 MG capsule Take 20 mg by mouth every evening.   Yes [provider]  simvastatin (ZOCOR) 40 MG tablet Take 40 mg by mouth every evening.   Yes [provider]  Teriparatide, Recombinant, (FORTEO) 600 MCG/2.4ML SOLN Inject 20 mcg into the skin daily.    Yes [provider]  Vitamin D, Ergocalciferol, (DRISDOL) 50000 units CAPS capsule Take 50,000 Units by mouth every Thursday at 6pm.   Yes [provider]  metoprolol succinate (TOPROL XL) 25 MG 24 hr tablet Take 1 tablet (25 mg total) by mouth daily. Patient not taking: Reported on 03/09/2018 06/25/17 06/25/18  Sherran Needs, NP   KNEE EXAM antalgic gait, no effusion, collateral ligaments intact, significant  crepitus on range of motion with range 5-120 degrees  Physical Examination: General appearance - alert, well appearing, and in no distress Mental status - alert, oriented to person, place, and time Chest - clear to auscultation, no wheezes, rales or rhonchi, symmetric air entry Heart - normal rate, regular rhythm, normal S1, S2, no murmurs, rubs, clicks or gallops Abdomen - soft, nontender, nondistended, no masses or organomegaly Neurological - alert,  oriented, normal speech, no focal findings or movement disorder noted   Asessment/Plan--- Rightt knee osteoarthritis with valgus deformity distal femur and retained hardware- - Planright femur hardware removal Procedure risks and potential comps discussed with patient who elects to proceed. Goals are decreased pain and increased function with a high likelihood of achieving both

## 2018-03-24 NOTE — Anesthesia Postprocedure Evaluation (Signed)
Anesthesia Post Note  Patient: PEGAH SEGEL  Procedure(s) Performed: HARDWARE REMOVAL right distal femur (Right Knee)     Patient location during evaluation: PACU Anesthesia Type: Spinal Level of consciousness: awake and alert Pain management: pain level controlled Vital Signs Assessment: post-procedure vital signs reviewed and stable Respiratory status: spontaneous breathing, nonlabored ventilation, respiratory function stable and patient connected to nasal cannula oxygen Cardiovascular status: blood pressure returned to baseline and stable Postop Assessment: no apparent nausea or vomiting Anesthetic complications: no    Last Vitals:  Vitals:   03/24/18 1836 03/24/18 1845  BP: 138/69 (!) 152/75  Pulse: 72 64  Resp: 18 17  Temp: 36.4 C   SpO2: 100% 100%    Last Pain:  Vitals:   03/24/18 1853  TempSrc:   PainSc: 8                  Kyerra Vargo DAVID

## 2018-03-24 NOTE — Brief Op Note (Signed)
03/24/2018  5:45 PM  PATIENT:  Kelly [name]  72 y.o. female  PRE-OPERATIVE DIAGNOSIS:  Retained hardware Right distal femur  POST-OPERATIVE DIAGNOSIS:  Retained hardware Right distal femur  PROCEDURE:  Procedure(s): HARDWARE REMOVAL right distal femur (Right)  SURGEON:  Surgeon(s) and Role:    Gaynelle Arabian, MD - Primary  PHYSICIAN ASSISTANT:   ASSISTANTS: [name], PA-C   ANESTHESIA:   spinal  EBL:  350 mL   BLOOD ADMINISTERED:none  DRAINS: none   LOCAL MEDICATIONS USED:  NONE  COUNTS:  YES  TOURNIQUET:   Total Tourniquet Time Documented: Thigh (Right) - 78 minutes Total: Thigh (Right) - 78 minutes   DICTATION: .Other Dictation: Dictation Number [id number]  PLAN OF CARE: Admit for overnight observation  PATIENT DISPOSITION:  PACU - hemodynamically stable.

## 2018-03-24 NOTE — Transfer of Care (Signed)
Immediate Anesthesia Transfer of Care Note  Patient: Kelly [name]  Procedure(s) Performed: HARDWARE REMOVAL right distal femur (Right Knee)  Patient Location: PACU  Anesthesia Type:Spinal  Level of Consciousness: awake, alert , oriented and patient cooperative  Airway & Oxygen Therapy: Patient Spontanous Breathing and Patient connected to face mask oxygen  Post-op Assessment: Report given to RN and Post -op Vital signs reviewed and stable  Post vital signs: Reviewed and stable  Last Vitals:  Vitals Value Taken Time  BP 138/69 03/24/2018  6:36 PM  Temp    Pulse 61 03/24/2018  6:39 PM  Resp 27 03/24/2018  6:39 PM  SpO2 100 % 03/24/2018  6:39 PM  Vitals shown include unvalidated device data.  Last Pain:  Vitals:   03/24/18 1345  TempSrc:   PainSc: 2       Patients Stated Pain Goal: 4 (83/72/90 2111)  Complications: No apparent anesthesia complications

## 2018-03-25 ENCOUNTER — Encounter (HOSPITAL_COMMUNITY): Payer: Self-pay | Admitting: Orthopedic Surgery

## 2018-03-25 DIAGNOSIS — Z472 Encounter for removal of internal fixation device: Secondary | ICD-10-CM | POA: Diagnosis not present

## 2018-03-25 MED ORDER — HYDROCODONE-ACETAMINOPHEN 7.5-325 MG PO TABS: 1.0000 | ORAL_TABLET | ORAL | 0 refills | Status: DC | PRN

## 2018-03-25 MED ORDER — APIXABAN 2.5 MG PO TABS
2.5000 mg | ORAL_TABLET | Freq: Two times a day (BID) | ORAL | Status: DC
  Administered 2018-03-25: 2.5 mg via ORAL

## 2018-03-25 MED ORDER — TRAMADOL HCL 50 MG PO TABS: 50.0000 mg | ORAL_TABLET | Freq: Four times a day (QID) | ORAL | 0 refills | Status: DC | PRN

## 2018-03-25 MED ORDER — CYCLOBENZAPRINE HCL 10 MG PO TABS: 10.0000 mg | ORAL_TABLET | Freq: Three times a day (TID) | ORAL | 0 refills | Status: DC | PRN

## 2018-03-25 NOTE — Evaluation (Signed)
Physical Therapy Evaluation Patient Details Name: Kelly [name] MRN: [id number] DOB: Sep 06, 1946 Today's Date: 03/25/2018   History of Present Illness  72 y.o. female s/p R distal femur fx with IM nail February 2016 now admitted for hardware removal.   Clinical Impression  Pt admitted with above diagnosis. Pt currently with functional limitations due to the deficits listed below (see PT Problem List). Pt ambulated 6' with RW, distance limited by pain. Pt will benefit from skilled PT to increase their independence and safety with mobility to allow discharge to the venue listed below.       Follow Up Recommendations Follow surgeon's recommendation for DC plan and follow-up therapies    Equipment Recommendations  None recommended by PT    Recommendations for Other Services       Precautions / Restrictions Precautions Precautions: Fall Required Braces or Orthoses: Knee Immobilizer - Right Knee Immobilizer - Right: On at all times(per patient ) Restrictions Weight Bearing Restrictions: No Other Position/Activity Restrictions: WBAT      Mobility  Bed Mobility Overal bed mobility: Needs Assistance Bed Mobility: Supine to Sit     Supine to sit: Min assist     General bed mobility comments: min A to advance/support RLE  Transfers Overall transfer level: Needs assistance Equipment used: Rolling walker (2 wheeled) Transfers: Sit to/from Stand Sit to Stand: +2 safety/equipment;Mod assist         General transfer comment: assist to rise/steady  Ambulation/Gait Ambulation/Gait assistance: Min assist;+2 safety/equipment Ambulation Distance (Feet): 6 Feet Assistive device: Rolling walker (2 wheeled) Gait Pattern/deviations: Step-to pattern;Decreased stride length;Decreased weight shift to right Gait velocity: decr   General Gait Details: VCs sequencing, distance limited by pain  Stairs            Wheelchair Mobility    Modified Rankin (Stroke Patients Only)       Balance Overall balance assessment: Needs assistance   Sitting balance-Leahy Scale: Good     Standing balance support: Bilateral upper extremity supported Standing balance-Leahy Scale: Poor Standing balance comment: needs BUE support                             Pertinent Vitals/Pain Pain Assessment: 0-10 Pain Score: 6  Pain Location: R knee Pain Descriptors / Indicators: Sore Pain Intervention(s): Limited activity within patient's tolerance;Monitored during session;Premedicated before session;Ice applied    Home Living Family/patient expects to be discharged to:: Private residence Living Arrangements: Spouse/significant other Available Help at Discharge: Family;Available 24 hours/day Type of Home: House Home Access: Ramped entrance     Home Layout: One level Home Equipment: Walker - 2 wheels;Wheelchair - manual;Cane - single point      Prior Function Level of Independence: Independent with assistive device(s)         Comments: used RW and cane     Hand Dominance   Dominant Hand: Left    Extremity/Trunk Assessment   Upper Extremity Assessment Upper Extremity Assessment: Overall WFL for tasks assessed    Lower Extremity Assessment Lower Extremity Assessment: RLE deficits/detail RLE Deficits / Details: ankle WNL, pt reported MD told her not to flex R knee, SLR -2/5, limited by pain RLE: Unable to fully assess due to immobilization    Cervical / Trunk Assessment Cervical / Trunk Assessment: Normal  Communication   Communication: No difficulties  Cognition Arousal/Alertness: Awake/alert Behavior During Therapy: WFL for tasks assessed/performed Overall Cognitive Status: Within Functional Limits for tasks assessed  General Comments      Exercises Total Joint Exercises Ankle Circles/Pumps: AROM;10 reps;Supine   Assessment/Plan    PT Assessment Patient needs continued PT services   PT Problem List Decreased strength;Decreased activity tolerance;Decreased mobility;Pain       PT Treatment Interventions DME instruction;Gait training;Therapeutic activities;Therapeutic exercise;Patient/family education    PT Goals (Current goals can be found in the Care Plan section)  Acute Rehab PT Goals Patient Stated Goal: to walk PT Goal Formulation: With patient/family Time For Goal Achievement: 04/01/18 Potential to Achieve Goals: Good    Frequency Min 3X/week   Barriers to discharge        Co-evaluation               AM-PAC PT "6 Clicks" Daily Activity  Outcome Measure Difficulty turning over in bed (including adjusting bedclothes, sheets and blankets)?: Unable Difficulty moving from lying on back to sitting on the side of the bed? : Unable Difficulty sitting down on and standing up from a chair with arms (e.g., wheelchair, bedside commode, etc,.)?: Unable Help needed moving to and from a bed to chair (including a wheelchair)?: A Lot Help needed walking in hospital room?: A Lot Help needed climbing 3-5 steps with a railing? : Total 6 Click Score: 8    End of Session Equipment Utilized During Treatment: Gait belt;Right knee immobilizer Activity Tolerance: Patient limited by pain Patient left: in chair;with call bell/phone within reach;with family/visitor present Nurse Communication: Mobility status PT Visit Diagnosis: Difficulty in walking, not elsewhere classified (R26.2)    Time: 1053-1110 PT Time Calculation (min) (ACUTE ONLY): 17 min   Charges:   PT Evaluation $PT Eval Low Complexity: 1 Low     PT G Codes:          [name] 03/25/2018, 11:28 AM [phone]

## 2018-03-25 NOTE — Discharge Summary (Signed)
Physician Discharge Summary   Patient ID: Kelly [name] MRN: [id number] DOB/AGE: 02-26-1946 72 y.o.  Admit date: 03/24/2018 Discharge date: 03/30/2018  Primary Diagnosis:   Retained hardware Right distal femur  Admission Diagnoses:  Past Medical History:  Diagnosis Date  . A-fib [facility])    patient reports she went into AFIB after cataract surgery in July 2018; reports being asymptomatic   . Arthritis    left knee arthritis   . Diabetes mellitus without complication ([place])    type 2   . Dysrhythmia   . Hypertension    Discharge Diagnoses:   Active Problems:   OA (osteoarthritis) of knee  Procedure:  Procedure(s) (LRB): HARDWARE REMOVAL right distal femur (Right)   Consults: None  HPI:  The patient is a 72 year old female who had a right distal femur fracture approximately 3 years ago.  She was treated with a retrograde intramedullary nail and healed it in valgus.  She has significant osteoarthritis of the knee to the point where injections are no longer beneficial.  She is going to need her knee replace and in order to do this, the hardware has to be removed.  It was felt that it will be best to do this in a staged procedure and she presents today for the hardware removal.      Laboratory Data: Hospital Outpatient Visit on 03/16/2018  Component Date Value Ref Range Status  . Glucose-Capillary 03/16/2018 145* 65 - 99 mg/dL Final  . WBC 03/16/2018 8.1  4.0 - 10.5 K/uL Final  . RBC 03/16/2018 4.09  3.87 - 5.11 MIL/uL Final  . Hemoglobin 03/16/2018 12.2  12.0 - 15.0 g/dL Final  . HCT 03/16/2018 37.8  36.0 - 46.0 % Final  . MCV 03/16/2018 92.4  78.0 - 100.0 fL Final  . MCH 03/16/2018 29.8  26.0 - 34.0 pg Final  . MCHC 03/16/2018 32.3  30.0 - 36.0 g/dL Final  . RDW 03/16/2018 15.0  11.5 - 15.5 % Final  . Platelets 03/16/2018 271  150 - 400 K/uL Final   Performed at [facility], [place] [address] [place], [place] [address]  . Sodium 03/16/2018 142   135 - 145 mmol/L Final  . Potassium 03/16/2018 4.4  3.5 - 5.1 mmol/L Final  . Chloride 03/16/2018 106  101 - 111 mmol/L Final  . CO2 03/16/2018 27  22 - 32 mmol/L Final  . Glucose, Bld 03/16/2018 154* 65 - 99 mg/dL Final  . BUN 03/16/2018 15  6 - 20 mg/dL Final  . Creatinine, Ser 03/16/2018 0.58  0.44 - 1.00 mg/dL Final  . Calcium 03/16/2018 10.1  8.9 - 10.3 mg/dL Final  . Total Protein 03/16/2018 7.6  6.5 - 8.1 g/dL Final  . Albumin 03/16/2018 4.0  3.5 - 5.0 g/dL Final  . AST 03/16/2018 23  15 - 41 U/L Final  . ALT 03/16/2018 17  14 - 54 U/L Final  . Alkaline Phosphatase 03/16/2018 98  38 - 126 U/L Final  . Total Bilirubin 03/16/2018 1.1  0.3 - 1.2 mg/dL Final  . GFR calc non Af Amer 03/16/2018 >60  >60 mL/min Final  . GFR calc Af Amer 03/16/2018 >60  >60 mL/min Final   Comment: (NOTE) The eGFR has been calculated using the CKD EPI equation. This calculation has not been validated in all clinical situations. eGFR's persistently <60 mL/min signify possible Chronic Kidney Disease.   . Anion gap 03/16/2018 9  5 - 15 Final   Performed at [facility], [place] [address], [place], [place] [address]  . Hgb A1c MFr Bld 03/16/2018 6.6* 4.8 - 5.6 % Final   Comment: (NOTE) Pre diabetes:          5.7%-6.4% Diabetes:              >6.4% Glycemic control for   <7.0% adults with diabetes   . Mean Plasma Glucose 03/16/2018 142.72  mg/dL Final   Performed at [place] [address] [place], [place] [address]   No results for input(s): HGB in the last 72 hours. No results for input(s): WBC, RBC, HCT, PLT in the last 72 hours. No results for input(s): NA, K, CL, CO2, BUN, CREATININE, GLUCOSE, CALCIUM in the last 72 hours. No results for input(s): LABPT, INR in the last 72 hours.  X-Rays:Dg C-arm 1-60 Min-no Report  Result Date: 03/24/2018 Fluoroscopy was utilized by the requesting physician.  No radiographic interpretation.    EKG: Orders placed or performed  during the hospital encounter of 03/16/18  . EKG 12 lead  . EKG 12 lead     Hospital Course: Patient was admitted to [facility] and taken to the OR and underwent the above state procedure without complications.  Patient tolerated the procedure well and was later transferred to the recovery room and then to the orthopaedic floor for postoperative care.  They were given PO and IV analgesics for pain control following their surgery.  They were given 24 hours of postoperative antibiotics.   PT was consulted postop to assist with mobility and transfers.  The patient was allowed to be WBAT with therapy. Discharge planning was consulted to help with postop disposition and equipment needs.  Patient had a decent night on the evening of surgery but did have some pain. She started to get up OOB with therapy on day one. Patient was seen in rounds and was ready to go home on day one.  They were given discharge instructions and dressing directions.  They were instructed on when to follow up in the office with Dr. Wynelle Link.  Diet - Cardiac diet and Diabetic diet Follow up - in 2 weeks Activity - WBAT Disposition - Home Condition Upon Discharge - Stable D/C Meds - See DC Summary DVT Prophylaxis - Eliquis     Discharge Instructions    Call MD / Call 911   Complete by:  As directed    If you experience chest pain or shortness of breath, CALL 911 and be transported to the hospital emergency room.  If you develope a fever above 101 F, pus (white drainage) or increased drainage or redness at the wound, or calf pain, call your surgeon's office.   Change dressing   Complete by:  As directed    You may change your dressing dressing daily with sterile 4 x 4 inch gauze dressing and paper tape.  Do not submerge the incision under water.   Constipation Prevention   Complete by:  As directed    Drink plenty of fluids.  Prune juice may be helpful.  You may use a stool softener, such as Colace (over the  counter) 100 mg twice a day.  Use MiraLax (over the counter) for constipation as needed.   Diet - low sodium heart healthy   Complete by:  As directed    Discharge instructions   Complete by:  As directed    Resume the Eliquis dosing at home following discharge  Pick up stool softner and  laxative for home use following surgery while on pain medications. Do not submerge incision under water. Please use good hand washing techniques while changing dressing each day. May shower starting three days after surgery. Please use a clean towel to pat the incision dry following showers. Continue to use ice for pain and swelling after surgery. Do not use any lotions or creams on the incision until instructed by your surgeon.  Wear both TED hose on both legs during the day every day for three weeks, but may remove the TED hose at night at home.  Postoperative Constipation Protocol  Constipation - defined medically as fewer than three stools per week and severe constipation as less than one stool per week.  One of the most common issues patients have following surgery is constipation.  Even if you have a regular bowel pattern at home, your normal regimen is likely to be disrupted due to multiple reasons following surgery.  Combination of anesthesia, postoperative narcotics, change in appetite and fluid intake all can affect your bowels.  In order to avoid complications following surgery, here are some recommendations in order to help you during your recovery period.  Colace (docusate) - Pick up an over-the-counter form of Colace or another stool softener and take twice a day as long as you are requiring postoperative pain medications.  Take with a full glass of water daily.  If you experience loose stools or diarrhea, hold the colace until you stool forms back up.  If your symptoms do not get better within 1 week or if they get worse, check with your doctor.  Dulcolax (bisacodyl) - Pick up over-the-counter  and take as directed by the product packaging as needed to assist with the movement of your bowels.  Take with a full glass of water.  Use this product as needed if not relieved by Colace only.   MiraLax (polyethylene glycol) - Pick up over-the-counter to have on hand.  MiraLax is a solution that will increase the amount of water in your bowels to assist with bowel movements.  Take as directed and can mix with a glass of water, juice, soda, coffee, or tea.  Take if you go more than two days without a movement. Do not use MiraLax more than once per day. Call your doctor if you are still constipated or irregular after using this medication for 7 days in a row.  If you continue to have problems with postoperative constipation, please contact the office for further assistance and recommendations.  If you experience "the worst abdominal pain ever" or develop nausea or vomiting, please contact the office immediatly for further recommendations for treatment.   Do not sit on low chairs, stoools or toilet seats, as it may be difficult to get up from low surfaces   Complete by:  As directed    Driving restrictions   Complete by:  As directed    No driving until released by the physician.   Increase activity slowly as tolerated   Complete by:  As directed    Lifting restrictions   Complete by:  As directed    No lifting until released by the physician.   Patient may shower   Complete by:  As directed    You may shower without a dressing once there is no drainage.  Do not wash over the wound.  If drainage remains, do not shower until drainage stops.   TED hose   Complete by:  As directed    Use  stockings (TED hose) for 3 weeks on both leg(s).  You may remove them at night for sleeping.   Weight bearing as tolerated   Complete by:  As directed      Allergies as of 03/25/2018      Reactions   Tape Other (See Comments)   Tears skin   Robaxin [methocarbamol] Nausea And Vomiting      Medication List      STOP taking these medications   FORTEO 600 MCG/2.4ML Soln Generic drug:  Teriparatide (Recombinant)   ICAPS AREDS 2 PO   metoprolol succinate 25 MG 24 hr tablet Commonly known as:  TOPROL XL   Vitamin D (Ergocalciferol) 50000 units Caps capsule Commonly known as:  DRISDOL   Vitamin D3 2000 units Tabs     TAKE these medications   apixaban 5 MG Tabs tablet Commonly known as:  ELIQUIS Take 1 tablet (5 mg total) by mouth 2 (two) times daily.   cyclobenzaprine 10 MG tablet Commonly known as:  FLEXERIL Take 1 tablet (10 mg total) by mouth 3 (three) times daily as needed for muscle spasms.   HYDROcodone-acetaminophen 7.5-325 MG tablet Commonly known as:  NORCO Take 1-2 tablets by mouth every 4 (four) hours as needed for moderate pain or severe pain.   pioglitazone-metformin 15-850 MG tablet Commonly known as:  ACTOPLUS MET Take 1 tablet by mouth every evening.   ramipril 10 MG capsule Commonly known as:  ALTACE Take 20 mg by mouth every evening.   simvastatin 40 MG tablet Commonly known as:  ZOCOR Take 40 mg by mouth every evening.   traMADol 50 MG tablet Commonly known as:  ULTRAM Take 1-2 tablets (50-100 mg total) by mouth every 6 (six) hours as needed for moderate pain (If Norco ineffective).            Discharge Care Instructions  (From admission, onward)        Start     Ordered   03/25/18 0000  Weight bearing as tolerated     03/25/18 0844   03/25/18 0000  Change dressing    Comments:  You may change your dressing dressing daily with sterile 4 x 4 inch gauze dressing and paper tape.  Do not submerge the incision under water.   03/25/18 0844     Follow-up Information    Gaynelle Arabian, MD. Schedule an appointment as soon as possible for a visit on 04/06/2018.   Specialty:  Orthopedic Surgery Contact information: [address] [place] [address] [phone]           Signed: [name], PA-C Orthopaedic  Surgery 03/30/2018, 10:53 PM

## 2018-03-25 NOTE — Discharge Instructions (Signed)
INSTRUCTIONS FOLLOWING SURGERY  o Remove items at home which could result in a fall. This includes throw rugs or furniture in walking pathways o ICE to the affected joint every three hours while awake for 30 minutes at a time, for at least the first 3-5 days, and then as needed for pain and swelling.  Continue to use ice for pain and swelling. You may notice swelling that will progress down to the foot and ankle.  This is normal after surgery.  Elevate your leg when you are not up walking on it.   o Continue to use the breathing machine you got in the hospital (incentive spirometer) which will help keep your temperature down.  It is common for your temperature to cycle up and down following surgery, especially at night when you are not up moving around and exerting yourself.  The breathing machine keeps your lungs expanded and your temperature down.   DIET:  As you were doing prior to hospitalization, we recommend a well-balanced diet.  DRESSING / WOUND CARE / SHOWERING  You may shower 3 days after surgery, but keep the wounds dry during showering.  You may use an occlusive plastic wrap (Press'n Seal for example), NO SOAKING/SUBMERGING IN THE BATHTUB.  If the bandage gets wet, change with a clean dry gauze.  If the incision gets wet, pat the wound dry with a clean towel.  ACTIVITY  o Increase activity slowly as tolerated, but follow the weight bearing instructions below.   o No driving for 6 weeks or until further direction given by your physician.  You cannot drive while taking narcotics.  o No lifting or carrying greater than 10 lbs. until further directed by your surgeon. o Avoid periods of inactivity such as sitting longer than an hour when not asleep. This helps prevent blood clots.  o You may return to work once you are authorized by your doctor.     WEIGHT BEARING   Weight bearing as tolerated with assist device (walker, cane, etc) as directed, use it as long as suggested by your  surgeon or therapist, typically at least 4-6 weeks.   CONSTIPATION  Constipation is defined medically as fewer than three stools per week and severe constipation as less than one stool per week.  Even if you have a regular bowel pattern at home, your normal regimen is likely to be disrupted due to multiple reasons following surgery.  Combination of anesthesia, postoperative narcotics, change in appetite and fluid intake all can affect your bowels.   YOU MUST use at least one of the following options; they are listed in order of increasing strength to get the job done.  They are all available over the counter, and you may need to use some, POSSIBLY even all of these options:    Drink plenty of fluids (prune juice may be helpful) and high fiber foods Colace 100 mg by mouth twice a day  Senokot for constipation as directed and as needed Dulcolax (bisacodyl), take with full glass of water  Miralax (polyethylene glycol) once or twice a day as needed.  If you have tried all these things and are unable to have a bowel movement in the first 3-4 days after surgery call either your surgeon or your primary doctor.    If you experience loose stools or diarrhea, hold the medications until you stool forms back up.  If your symptoms do not get better within 1 week or if they get worse, check with your doctor.  If you experience "the worst abdominal pain ever" or develop nausea or vomiting, please contact the office immediately for further recommendations for treatment.   ITCHING:  If you experience itching with your medications, try taking only a single pain pill, or even half a pain pill at a time.  You can also use Benadryl over the counter for itching or also to help with sleep.   TED HOSE STOCKINGS:  Use stockings on both legs until for at least 2 weeks or as directed by physician office. They may be removed at night for sleeping.  MEDICATIONS:  See your medication summary on the After Visit Summary  that nursing will review with you.  You may have some home medications which will be placed on hold until you complete the course of blood thinner medication.  It is important for you to complete the blood thinner medication as prescribed.  PRECAUTIONS:  If you experience chest pain or shortness of breath - call 911 immediately for transfer to the hospital emergency department.   If you develop a fever greater that 101 F, purulent drainage from wound, increased redness or drainage from wound, foul odor from the wound/dressing, or calf pain - CONTACT YOUR SURGEON.                                                   FOLLOW-UP APPOINTMENTS:  If you do not already have a post-op appointment, please call the office for an appointment to be seen by your surgeon.  Guidelines for how soon to be seen are listed in your After Visit Summary, but are typically between 1-4 weeks after surgery.  OTHER INSTRUCTIONS:   Knee Replacement:  . CPM instructions: 0-90 degrees, 2 hours in the morning, 2 hours in the afternoon, and 2 hours in the evening. Place foam block, curve side up under heel at all times except when in CPM or when walking.  DO NOT modify, tear, cut, or change the foam block in any way.  MAKE SURE YOU:   Understand these instructions.   Get help right away if you are not doing well or get worse.    Thank you for letting us be a part of your medical care team.  It is a privilege we respect greatly.  We hope these instructions will help you stay on track for a fast and full recovery!

## 2018-03-25 NOTE — Progress Notes (Signed)
Physical Therapy Treatment Patient Details Name: Kelly [name] MRN: [id number] DOB: September 24, 1946 Today's Date: 03/25/2018    History of Present Illness 72 y.o. female s/p R distal femur fx with IM nail February 2016 now admitted for hardware removal.     PT Comments    Pt tolerated slightly longer ambulation distance of 10' with RW, distance limited by pain. Clarified knee immobilizer order with [name], PA. He stated pt to wear R KI when ambulating, she may flex R knee when non weightbearing. Instructed pt in R knee ROM/strengthening exercises for HEP. Pt stated she has needed assistance from family at home and can use WC with leg rests for longer distances.    Follow Up Recommendations  Follow surgeon's recommendation for DC plan and follow-up therapies     Equipment Recommendations  None recommended by PT    Recommendations for Other Services       Precautions / Restrictions Precautions Precautions: Fall Required Braces or Orthoses: Knee Immobilizer - Right Knee Immobilizer - Right: On when out of bed or walking(per [name], [place], KI on when walking, pt allowed to flex knee when not walking) Restrictions Weight Bearing Restrictions: No Other Position/Activity Restrictions: WBAT    Mobility  Bed Mobility Overal bed mobility: Needs Assistance Bed Mobility: Supine to Sit     Supine to sit: Min assist     General bed mobility comments: up in recliner  Transfers Overall transfer level: Needs assistance Equipment used: Rolling walker (2 wheeled) Transfers: Sit to/from Stand Sit to Stand: +2 safety/equipment;Mod assist         General transfer comment: assist to rise/steady  Ambulation/Gait Ambulation/Gait assistance: +2 safety/equipment;Min guard Ambulation Distance (Feet): 10 Feet Assistive device: Rolling walker (2 wheeled) Gait Pattern/deviations: Step-to pattern;Decreased stride length;Decreased weight shift to right Gait velocity: decr   General Gait  Details: VCs sequencing, distance limited by pain/fatigue   Stairs             Wheelchair Mobility    Modified Rankin (Stroke Patients Only)       Balance Overall balance assessment: Needs assistance   Sitting balance-Leahy Scale: Good     Standing balance support: Bilateral upper extremity supported Standing balance-Leahy Scale: Poor Standing balance comment: needs BUE support                            Cognition Arousal/Alertness: Awake/alert Behavior During Therapy: WFL for tasks assessed/performed Overall Cognitive Status: Within Functional Limits for tasks assessed                                        Exercises Total Joint Exercises Ankle Circles/Pumps: AROM;10 reps;Supine Quad Sets: AROM;Right;5 reps;Supine Short Arc Quad: AROM;Right;10 reps;Supine Heel Slides: AAROM;Right;10 reps;Supine Straight Leg Raises: AAROM;Right;10 reps;Supine Goniometric ROM: 0-30* AAROM R knee    General Comments        Pertinent Vitals/Pain Pain Assessment: 0-10 Pain Score: 4  Pain Location: R knee Pain Descriptors / Indicators: Sore Pain Intervention(s): Limited activity within patient's tolerance;Monitored during session;Premedicated before session;Ice applied    Home Living Family/patient expects to be discharged to:: Private residence Living Arrangements: Spouse/significant other Available Help at Discharge: Family;Available 24 hours/day Type of Home: House Home Access: Ramped entrance   Home Layout: One level Home Equipment: Walker - 2 wheels;Wheelchair - manual;Cane - single point  Prior Function Level of Independence: Independent with assistive device(s)      Comments: used RW and cane   PT Goals (current goals can now be found in the care plan section) Acute Rehab PT Goals Patient Stated Goal: to walk PT Goal Formulation: With patient/family Time For Goal Achievement: 04/01/18 Potential to Achieve Goals:  Good Progress towards PT goals: Progressing toward goals    Frequency    Min 3X/week      PT Plan Current plan remains appropriate    Co-evaluation              AM-PAC PT "6 Clicks" Daily Activity  Outcome Measure  Difficulty turning over in bed (including adjusting bedclothes, sheets and blankets)?: Unable Difficulty moving from lying on back to sitting on the side of the bed? : Unable Difficulty sitting down on and standing up from a chair with arms (e.g., wheelchair, bedside commode, etc,.)?: Unable Help needed moving to and from a bed to chair (including a wheelchair)?: A Lot Help needed walking in hospital room?: A Lot Help needed climbing 3-5 steps with a railing? : Total 6 Click Score: 8    End of Session Equipment Utilized During Treatment: Gait belt;Right knee immobilizer Activity Tolerance: Patient limited by pain Patient left: in chair;with call bell/phone within reach;with family/visitor present Nurse Communication: Mobility status PT Visit Diagnosis: Difficulty in walking, not elsewhere classified (R26.2)     Time: 9532-0233 PT Time Calculation (min) (ACUTE ONLY): 24 min  Charges:  $Gait Training: 8-22 mins $Therapeutic Exercise: 8-22 mins                    G Codes:          [name] 03/25/2018, 12:42 PM [phone]

## 2018-03-25 NOTE — Op Note (Signed)
NAME:  Kelly [name], Kelly [name] NO.:  [id number]  MEDICAL RECORD NO.:  [id number]  LOCATION:                                 FACILITY:  PHYSICIAN:  Gaynelle Arabian, M.D.         DATE OF BIRTH:  DATE OF PROCEDURE:  03/24/2018 DATE OF DISCHARGE:                              OPERATIVE REPORT   PREOPERATIVE DIAGNOSIS:  Retained hardware, right distal femur with osteoarthritis, right knee.  POSTOPERATIVE DIAGNOSIS:  Retained hardware, right distal femur with osteoarthritis, right knee.  PROCEDURE:  Hardware removal, right distal femur.  SURGEON:  Gaynelle Arabian, M.D.  ASSISTANT:  Alexzandrew L. Perkins, PA-C.  ANESTHESIA:  Spinal.  ESTIMATED BLOOD LOSS:  350.  DRAINS:  None.  TOURNIQUET TIME:  76 minutes at 300 mmHg.  COMPLICATIONS:  None.  CONDITION:  Stable to recovery.  BRIEF CLINICAL NOTE:  The patient is a 72 year old female who had a right distal femur fracture approximately 3 years ago.  She was treated with a retrograde intramedullary nail and healed it in valgus.  She has significant osteoarthritis of the knee to the point where injections are no longer beneficial.  She is going to need her knee replace and in order to do this, the hardware has to be removed.  It was felt that it will be best to do this in a staged procedure and she presents today for the hardware removal.  PROCEDURE IN DETAIL:  After successful administration of spinal anesthetic, a tourniquet was placed high on her right thigh and her right lower extremity prepped and draped in usual sterile fashion. Extremities wrapped in Esmarch and the tourniquet inflated to 300 mmHg. Made a lateral incision first and cut through the subcutaneous tissue to the fascia lata, which was incised in line with the skin incision, and it points and positioned where it should have been, the screw heads were not identified.  Under fluoro guidance, it was felt that the screws were probably in the femoral  canal and bone had overgrown them.  We made a small incision superior to this in order to find the proximal screws and those were found.  These were distal in the nail and proximal in the femur, and those two screws were easily removed.  The knee arthrotomy was then made first with an incision, utilizing her previous, anterior incision was made with a 10-blade through the subcutaneous tissue to the extensor mechanism, and a small medial arthrotomy was made.  The bone was overgrown the entry hole for the nail and we did under fluoroscopic guidance, find the position of the nail and made a small hole in the distal femur and found the nail.  I threaded the extraction device into the nail to also help [place] to gauging alignment.  I had to make a cortical window in the lateral cortex of the femur in order to gain access to the interlock screws, which were distal in the femur, proximal in the nail. We were able to remove one screw without difficulty.  Other two screws were essentially stripped and I had to place an osteotome between the screw head and the nail in order to provide traction on the  screw in order to get it removed.  These screws were removed, then the nail was easily removed.  The wounds were then copiously irrigated with saline solution and tourniquet released, total time was 78 minutes.  The arthrotomy was closed with a running #1 Stratafix suture and then the fascia lata closed in the lateral incision with interrupted 0 Vicryl. The subcu tissues were closed with interrupted 2-0 Vicryl and skin with staples.  Incisions were cleaned and dried, and a bulky sterile dressing applied.  She was placed into a knee immobilizer, awakened and transported to recovery in stable condition.  She will be weightbearing as tolerated, the immobilizer on utilizing a walker.  Please note that a surgical assistant is a necessity for this procedure as it was rather difficult to access the screws and  countertraction need to be applied to remove the screws.     Gaynelle Arabian, M.D.     FA/MEDQ  D:  03/24/2018  T:  03/25/2018  Job:  [id number]

## 2018-03-25 NOTE — Progress Notes (Signed)
   Subjective: 1 Day Post-Op Procedure(s) (LRB): HARDWARE REMOVAL right distal femur (Right) Patient reports pain as moderate.   Patient seen in rounds with Dr. Wynelle Link.  Briefly discussed surgical findings. Patient is having problems with pain in the knee and thigh, requiring pain medications Patient is ready to go home later today if she does okay with therapy and pain improves  Objective: Vital signs in last 24 hours: Temp:  [97.4 F (36.3 C)-98.2 F (36.8 C)] 97.5 F (36.4 C) (04/18 0521) Pulse Rate:  [60-110] 110 (04/18 0521) Resp:  [12-18] 18 (04/17 1930) BP: (123-169)/(69-96) 143/70 (04/18 0521) SpO2:  [96 %-100 %] 100 % (04/18 0521) Weight:  [117.5 kg (259 lb)] 117.5 kg (259 lb) (04/17 2000)  Intake/Output from previous day:  Intake/Output Summary (Last 24 hours) at 03/25/2018 0819 Last data filed at 03/25/2018 0600 Gross per 24 hour  Intake 3744.97 ml  Output 950 ml  Net 2794.97 ml    Intake/Output this shift: No intake/output data recorded.  Labs: No results for input(s): HGB in the last 72 hours. No results for input(s): WBC, RBC, HCT, PLT in the last 72 hours. No results for input(s): NA, K, CL, CO2, BUN, CREATININE, GLUCOSE, CALCIUM in the last 72 hours. No results for input(s): LABPT, INR in the last 72 hours.  EXAM: General - Patient is Alert, Appropriate and Oriented Extremity - Neurovascular intact Sensation intact distally Intact pulses distally Dorsiflexion/Plantar flexion intact Incision - clean, dry, no drainage Motor Function - intact, moving foot and toes well on exam.   Assessment/Plan: 1 Day Post-Op Procedure(s) (LRB): HARDWARE REMOVAL right distal femur (Right) Procedure(s) (LRB): HARDWARE REMOVAL right distal femur (Right) Past Medical History:  Diagnosis Date  . A-fib [facility])    patient reports she went into AFIB after cataract surgery in July 2018; reports being asymptomatic   . Arthritis    left knee arthritis   . Diabetes mellitus  without complication ([place])    type 2   . Dysrhythmia   . Hypertension    Active Problems:   OA (osteoarthritis) of knee  Estimated body mass index is 48.15 kg/m as calculated from the following:   Height as of this encounter: 5' 1.5" (1.562 m).   Weight as of this encounter: 117.5 kg (259 lb). Advance diet Up with therapy Diet - Cardiac diet and Diabetic diet Follow up - in 2 weeks Activity - WBAT Disposition - Home Condition Upon Discharge - Stable D/C Meds - See DC Summary DVT Prophylaxis - Eliquis  [name], PA-C Orthopaedic Surgery 03/25/2018, 8:19 AM

## 2018-03-25 NOTE — Progress Notes (Addendum)
Discharge summary discussed with patient, husband, and son, including medications, dressing changes, and when to call MD.  Questions answered and pt, husband, and son all verbalized understanding.

## 2018-03-29 LAB — AEROBIC/ANAEROBIC CULTURE W GRAM STAIN (SURGICAL/DEEP WOUND)
Culture: NO GROWTH
Gram Stain: NONE SEEN

## 2018-03-29 LAB — AEROBIC/ANAEROBIC CULTURE (SURGICAL/DEEP WOUND)

## 2018-05-06 ENCOUNTER — Encounter (HOSPITAL_COMMUNITY): Payer: Self-pay | Admitting: *Deleted

## 2018-05-06 NOTE — H&P (Signed)
TOTAL KNEE ADMISSION H&P  Patient is being admitted for right total knee arthroplasty.  Subjective:  Chief Complaint:right knee pain.  HPI: Kelly [name], 72 y.o. female, has a history of pain and functional disability in the right knee due to trauma and arthritis and has failed non-surgical conservative treatments for greater than 12 weeks to includeNSAID's and/or analgesics, corticosteriod injections, viscosupplementation injections, flexibility and strengthening excercises, use of assistive devices and activity modification.  Onset of symptoms was gradual, starting 3 years ago with gradually worsening course since that time. The patient noted prior procedures on the knee to include  ORIF distal femur and subsequent hardware removal on the right knee(s).  Patient currently rates pain in the right knee(s) at 8 out of 10 with activity. Patient has night pain, worsening of pain with activity and weight bearing, pain that interferes with activities of daily living, pain with passive range of motion, crepitus and joint swelling.  Patient has evidence of subchondral cysts, subchondral sclerosis, periarticular osteophytes, joint subluxation and joint space narrowing by imaging studies. This patient has had distal femur fracture. There is no active infection.  Patient Active Problem List   Diagnosis Date Noted  . OA (osteoarthritis) of knee 03/24/2018  . Closed fracture of right distal femur ([place]) 01/16/2015  . Fracture of distal femur ([place]) 01/16/2015  . Local infection of skin and subcutaneous tissue 08/15/2013  . OA (osteoarthritis) of hip 07/07/2013   Past Medical History:  Diagnosis Date  . A-fib [facility])    patient reports she went into AFIB after cataract surgery in July 2018; reports being asymptomatic   . Arthritis    left knee arthritis   . Diabetes mellitus without complication ([place])    type 2   . Dysrhythmia   . Hypertension     Past Surgical History:  Procedure Laterality Date   . EYE SURGERY     cataract surgery ; left 04-2017, right 06-2017  . FEMUR IM NAIL Right 01/16/2015   Procedure: INTRAMEDULLARY (IM) RETROGRADE FEMORAL NAILING;  Surgeon: Gearlean Alf, MD;  Location: [facility];  Service: Orthopedics;  Laterality: Right;  . HARDWARE REMOVAL Right 03/24/2018   Procedure: HARDWARE REMOVAL right distal femur;  Surgeon: Gaynelle Arabian, MD;  Location: [facility];  Service: Orthopedics;  Laterality: Right;  . I&D EXTREMITY Right 08/15/2013   Procedure: INCISION AND DRAINAGE RIGHT HIP INFECTION (PREVIOUS RIGHT TOTAL HIP REPLACEMENT);  Surgeon: Gearlean Alf, MD;  Location: [facility];  Service: Orthopedics;  Laterality: Right;  . I&D EXTREMITY Right 09/12/2013   Procedure: IRRIGATION AND DEBRIDEMENT RIGHT  LATERAL THIGH;  Surgeon: Gearlean Alf, MD;  Location: [facility];  Service: Orthopedics;  Laterality: Right;  . INCISION AND DRAINAGE HIP Right 09/09/2013   Procedure: IRRIGATION AND DEBRIDEMENT HIP;  Surgeon: Augustin Schooling, MD;  Location: [facility];  Service: Orthopedics;  Laterality: Right;  . TOTAL HIP ARTHROPLASTY Right 07/07/2013   Procedure: RIGHT TOTAL HIP ARTHROPLASTY;  Surgeon: Gearlean Alf, MD;  Location: [facility];  Service: Orthopedics;  Laterality: Right;    No current facility-administered medications for this encounter.    Current Outpatient Medications  Medication Sig Dispense Refill Last Dose  . acetaminophen (TYLENOL) 500 MG tablet Take 1,000 mg by mouth every 6 (six) hours as needed for moderate pain.     Marland Kitchen apixaban (ELIQUIS) 5 MG TABS tablet Take 1 tablet (5 mg total) by mouth 2 (two) times daily. 60 tablet 6 03/21/2018  . Ergocalciferol (VITAMIN D2) 2000 units  TABS Take 2,000 Units by mouth 2 (two) times daily.     . Multiple Vitamins-Minerals (ICAPS AREDS 2 PO) Take 2 capsules by mouth daily.     . pioglitazone-metformin (ACTOPLUS MET) 15-850 MG per tablet Take 1 tablet by mouth every evening.   03/23/2018 at Unknown time  . ramipril (ALTACE) 10 MG capsule Take  20 mg by mouth every evening.   03/23/2018 at Unknown time  . simvastatin (ZOCOR) 40 MG tablet Take 40 mg by mouth every evening.   03/23/2018 at Unknown time  . Teriparatide, Recombinant, (FORTEO) 600 MCG/2.4ML SOLN Inject 20 mcg into the skin daily.     Marland Kitchen triamcinolone cream (KENALOG) 0.1 % Apply 1 application topically daily as needed for skin breakdown.     . Vitamin D, Ergocalciferol, (DRISDOL) 50000 units CAPS capsule Take 50,000 Units by mouth every 7 (seven) days.     . cyclobenzaprine (FLEXERIL) 10 MG tablet Take 1 tablet (10 mg total) by mouth 3 (three) times daily as needed for muscle spasms. (Patient not taking: Reported on 04/29/2018) 90 tablet 0 Not Taking at Unknown time  . HYDROcodone-acetaminophen (NORCO) 7.5-325 MG tablet Take 1-2 tablets by mouth every 4 (four) hours as needed for moderate pain or severe pain. (Patient not taking: Reported on 04/29/2018) 80 tablet 0 Not Taking at Unknown time  . traMADol (ULTRAM) 50 MG tablet Take 1-2 tablets (50-100 mg total) by mouth every 6 (six) hours as needed for moderate pain (If Norco ineffective). (Patient not taking: Reported on 04/29/2018) 56 tablet 0 Not Taking at Unknown time   Allergies  Allergen Reactions  . Tape Other (See Comments)    Tears skin  . Robaxin [Methocarbamol] Nausea And Vomiting    Social History   Tobacco Use  . Smoking status: Never Smoker  . Smokeless tobacco: Never Used  Substance Use Topics  . Alcohol use: No      Review of Systems  Constitutional: Negative.   HENT: Negative.   Eyes: Negative.   Respiratory: Negative.   Cardiovascular: Negative.   Gastrointestinal: Negative.   Genitourinary: Negative.   Musculoskeletal: Positive for joint pain and myalgias. Negative for back pain, falls and neck pain.  Skin: Negative.   Neurological: Negative.   Endo/Heme/Allergies: Negative.   Psychiatric/Behavioral: Negative.     Objective:  Physical Exam  Constitutional: She is oriented to person, place,  and time. She appears well-developed. No distress.  Morbidly obese  HENT:  Head: Normocephalic and atraumatic.  Right Ear: External ear normal.  Left Ear: External ear normal.  Nose: Nose normal.  Mouth/Throat: Oropharynx is clear and moist.  Eyes: Conjunctivae and EOM are normal.  Neck: Normal range of motion. Neck supple.  Cardiovascular: Normal rate, regular rhythm, normal heart sounds and intact distal pulses.  No murmur heard. Respiratory: Effort normal and breath sounds normal. No respiratory distress. She has no wheezes.  GI: Soft. Bowel sounds are normal. She exhibits no distension. There is no tenderness.  Musculoskeletal:  Left femur exam: No effusion. Significant valgus deformity of the knee which was present beforehand.  Well healed incisions, no erythema, dry, no drainage and without tenderness.  Range of motion is 0-125 degrees.  No crepitus on range of motion of the knee.  No medial or lateral joint line tenderness.  Stable knee  Neurological: She is alert and oriented to person, place, and time. She has normal strength. No sensory deficit.  Skin: No rash noted. She is not diaphoretic. No erythema.  Psychiatric: She  has a normal mood and affect. Her behavior is normal.   Ht: 5 ft 2.5 in  Wt: 245 lbs  BMI: 44.1  BP: 148/86 sitting L arm  Pulse: 84 bpm   Imaging Review Plain radiographs demonstrate severe degenerative joint disease of the right knee(s). The overall alignment issignificant valgus. The bone quality appears to be fair for age and reported activity level.   Preoperative templating of the joint replacement has been completed, documented, and submitted to the Operating Room personnel in order to optimize intra-operative equipment management.   Anticipated LOS equal to or greater than 2 midnights due to - Age 32 and older with one or more of the following:  - Obesity  - Expected need for hospital services (PT, OT, Nursing) required for safe   discharge  - Anticipated need for postoperative skilled nursing care or inpatient rehab  - Active co-morbidities: Diabetes and Cardiac Arrhythmia    Assessment/Plan:  End stage post-traumatic osteoarthritis, right knee   The patient history, physical examination, clinical judgment of the provider and imaging studies are consistent with end stage degenerative joint disease of the right knee(s) and total knee arthroplasty is deemed medically necessary. The treatment options including medical management, injection therapy arthroscopy and arthroplasty were discussed at length. The risks and benefits of total knee arthroplasty were presented and reviewed. The risks due to aseptic loosening, infection, stiffness, patella tracking problems, thromboembolic complications and other imponderables were discussed. The patient acknowledged the explanation, agreed to proceed with the plan and consent was signed. Patient is being admitted for inpatient treatment for surgery, pain control, PT, OT, prophylactic antibiotics, VTE prophylaxis, progressive ambulation and ADL's and discharge planning. The patient is planning to be discharged home with home health services vs outpatient therapy.   Therapy Plans: HHPT vs outpatient therapy Disposition: Home with family Planned DVT prophylaxis: resume Eliquis DME needed: none PCP: Dr. Forde Dandy Other: TXA IV   Ardeen Jourdain, PA-C

## 2018-05-06 NOTE — Patient Instructions (Addendum)
Kelly [name]  05/06/2018   Your procedure is scheduled on: 05/10/2018   Report to [facility] Main  Entrance  Report to admitting at 1000  AM    Call this number if you have problems the morning of surgery [phone]  Do not eat foods or drink liquids after midnite.       Take these medicines the morning of surgery with A SIP OF WATER:none   DO NOT TAKE ANY DIABETIC MEDICATIONS DAY OF YOUR SURGERY                               You may not have any metal on your body including hair pins and              piercings  Do not wear jewelry, make-up, lotions, powders or perfumes, deodorant               Do not bring valuables to the hospital. [place].  Contacts, dentures or bridgework may not be worn into surgery.  Leave suitcase in the car. After surgery it may be brought to your room.                 Please read over the following fact sheets you were given: _____________________________________________________________________             [facility] - Preparing for Surgery Before surgery, you can play an important role.  Because skin is not sterile, your skin needs to be as free of germs as possible.  You can reduce the number of germs on your skin by washing with CHG (chlorahexidine gluconate) soap before surgery.  CHG is an antiseptic cleaner which kills germs and bonds with the skin to continue killing germs even after washing. Please DO NOT use if you have an allergy to CHG or antibacterial soaps.  If your skin becomes reddened/irritated stop using the CHG and inform your nurse when you arrive at Short Stay. Do not shave (including legs and underarms) for at least 48 hours prior to the first CHG shower.  You may shave your face/neck. Please follow these instructions carefully:  1.  Shower with CHG Soap the night before surgery and the  morning of Surgery.  2.  If you choose to wash your hair, wash  your hair first as usual with your  normal  shampoo.  3.  After you shampoo, rinse your hair and body thoroughly to remove the  shampoo.                           4.  Use CHG as you would any other liquid soap.  You can apply chg directly  to the skin and wash                       Gently with a scrungie or clean washcloth.  5.  Apply the CHG Soap to your body ONLY FROM THE NECK DOWN.   Do not use on face/ open                           Wound or open sores. Avoid contact  with eyes, ears mouth and genitals (private parts).                       Wash face,  Genitals (private parts) with your normal soap.             6.  Wash thoroughly, paying special attention to the area where your surgery  will be performed.  7.  Thoroughly rinse your body with warm water from the neck down.  8.  DO NOT shower/wash with your normal soap after using and rinsing off  the CHG Soap.                9.  Pat yourself dry with a clean towel.            10.  Wear clean pajamas.            11.  Place clean sheets on your bed the night of your first shower and do not  sleep with pets. Day of Surgery : Do not apply any lotions/deodorants the morning of surgery.  Please wear clean clothes to the hospital/surgery center.  FAILURE TO FOLLOW THESE INSTRUCTIONS MAY RESULT IN THE CANCELLATION OF YOUR SURGERY PATIENT SIGNATURE_________________________________  NURSE SIGNATURE__________________________________  ________________________________________________________________________          _____________________________________________________________________  WHAT IS A BLOOD TRANSFUSION? Blood Transfusion Information  A transfusion is the replacement of blood or some of its parts. Blood is made up of multiple cells which provide different functions.  Red blood cells carry oxygen and are used for blood loss replacement.  White blood cells fight against infection.  Platelets control bleeding.  Plasma helps  clot blood.  Other blood products are available for specialized needs, such as hemophilia or other clotting disorders. BEFORE THE TRANSFUSION  Who gives blood for transfusions?   Healthy volunteers who are fully evaluated to make sure their blood is safe. This is blood bank blood. Transfusion therapy is the safest it has ever been in the practice of medicine. Before blood is taken from a donor, a complete history is taken to make sure that person has no history of diseases nor engages in risky social behavior (examples are intravenous drug use or sexual activity with multiple partners). The donor's travel history is screened to minimize risk of transmitting infections, such as malaria. The donated blood is tested for signs of infectious diseases, such as HIV and hepatitis. The blood is then tested to be sure it is compatible with you in order to minimize the chance of a transfusion reaction. If you or a relative donates blood, this is often done in anticipation of surgery and is not appropriate for emergency situations. It takes many days to process the donated blood. RISKS AND COMPLICATIONS Although transfusion therapy is very safe and saves many lives, the main dangers of transfusion include:   Getting an infectious disease.  Developing a transfusion reaction. This is an allergic reaction to something in the blood you were given. Every precaution is taken to prevent this. The decision to have a blood transfusion has been considered carefully by your caregiver before blood is given. Blood is not given unless the benefits outweigh the risks. AFTER THE TRANSFUSION  Right after receiving a blood transfusion, you will usually feel much better and more energetic. This is especially true if your red blood cells have gotten low (anemic). The transfusion raises the level of the red blood cells which carry oxygen, and this usually causes an  energy increase.  The nurse administering the transfusion will  monitor you carefully for complications. HOME CARE INSTRUCTIONS  No special instructions are needed after a transfusion. You may find your energy is better. Speak with your caregiver about any limitations on activity for underlying diseases you may have. SEEK MEDICAL CARE IF:   Your condition is not improving after your transfusion.  You develop redness or irritation at the intravenous (IV) site. SEEK IMMEDIATE MEDICAL CARE IF:  Any of the following symptoms occur over the next 12 hours:  Shaking chills.  You have a temperature by mouth above 102 F (38.9 C), not controlled by medicine.  Chest, back, or muscle pain.  People around you feel you are not acting correctly or are confused.  Shortness of breath or difficulty breathing.  Dizziness and fainting.  You get a rash or develop hives.  You have a decrease in urine output.  Your urine turns a dark color or changes to pink, red, or brown. Any of the following symptoms occur over the next 10 days:  You have a temperature by mouth above 102 F (38.9 C), not controlled by medicine.  Shortness of breath.  Weakness after normal activity.  The white part of the eye turns yellow (jaundice).  You have a decrease in the amount of urine or are urinating less often.  Your urine turns a dark color or changes to pink, red, or brown. Document Released: 11/21/2000 Document Revised: 02/16/2012 Document Reviewed: 07/10/2008 ExitCare Patient Information 2014 [place].  _______________________________________________________________________  Incentive Spirometer  An incentive spirometer is a tool that can help keep your lungs clear and active. This tool measures how well you are filling your lungs with each breath. Taking long deep breaths may help reverse or decrease the chance of developing breathing (pulmonary) problems (especially infection) following:  A long period of time when you are unable to move or be  active. BEFORE THE PROCEDURE   If the spirometer includes an indicator to show your best effort, your nurse or respiratory therapist will set it to a desired goal.  If possible, sit up straight or lean slightly forward. Try not to slouch.  Hold the incentive spirometer in an upright position. INSTRUCTIONS FOR USE  1. Sit on the edge of your bed if possible, or sit up as far as you can in bed or on a chair. 2. Hold the incentive spirometer in an upright position. 3. Breathe out normally. 4. Place the mouthpiece in your mouth and seal your lips tightly around it. 5. Breathe in slowly and as deeply as possible, raising the piston or the ball toward the top of the column. 6. Hold your breath for 3-5 seconds or for as long as possible. Allow the piston or ball to fall to the bottom of the column. 7. Remove the mouthpiece from your mouth and breathe out normally. 8. Rest for a few seconds and repeat Steps 1 through 7 at least 10 times every 1-2 hours when you are awake. Take your time and take a few normal breaths between deep breaths. 9. The spirometer may include an indicator to show your best effort. Use the indicator as a goal to work toward during each repetition. 10. After each set of 10 deep breaths, practice coughing to be sure your lungs are clear. If you have an incision (the cut made at the time of surgery), support your incision when coughing by placing a pillow or rolled up towels firmly against it. Once you  are able to get out of bed, walk around indoors and cough well. You may stop using the incentive spirometer when instructed by your caregiver.  RISKS AND COMPLICATIONS  Take your time so you do not get dizzy or light-headed.  If you are in pain, you may need to take or ask for pain medication before doing incentive spirometry. It is harder to take a deep breath if you are having pain. AFTER USE  Rest and breathe slowly and easily.  It can be helpful to keep track of a log of  your progress. Your caregiver can provide you with a simple table to help with this. If you are using the spirometer at home, follow these instructions: [place] IF:   You are having difficultly using the spirometer.  You have trouble using the spirometer as often as instructed.  Your pain medication is not giving enough relief while using the spirometer.  You develop fever of 100.5 F (38.1 C) or higher. SEEK IMMEDIATE MEDICAL CARE IF:   You cough up bloody sputum that had not been present before.  You develop fever of 102 F (38.9 C) or greater.  You develop worsening pain at or near the incision site. MAKE SURE YOU:   Understand these instructions.  Will watch your condition.  Will get help right away if you are not doing well or get worse. Document Released: 04/06/2007 Document Revised: 02/16/2012 Document Reviewed: 06/07/2007 [facility] Patient Information 2014 [place], [place].   ________________________________________________________________________

## 2018-05-07 ENCOUNTER — Encounter (HOSPITAL_COMMUNITY)
Admission: RE | Admit: 2018-05-07 | Discharge: 2018-05-07 | Disposition: A | Discharge status: Home / Self Care | Payer: Worker's Compensation | Source: Ambulatory Visit | Attending: Orthopedic Surgery | Admitting: Orthopedic Surgery

## 2018-05-07 LAB — CBC WITH DIFFERENTIAL/PLATELET
Basophils Absolute: 0 10*3/uL (ref 0.0–0.1)
Basophils Relative: 0 %
Eosinophils Absolute: 0.2 10*3/uL (ref 0.0–0.7)
Eosinophils Relative: 2 %
HCT: 34.8 % — ABNORMAL LOW (ref 36.0–46.0)
Hemoglobin: 10.8 g/dL — ABNORMAL LOW (ref 12.0–15.0)
Lymphocytes Relative: 29 %
Lymphs Abs: 2.1 10*3/uL (ref 0.7–4.0)
MCH: 28.3 pg (ref 26.0–34.0)
MCHC: 31 g/dL (ref 30.0–36.0)
MCV: 91.3 fL (ref 78.0–100.0)
Monocytes Absolute: 0.6 10*3/uL (ref 0.1–1.0)
Monocytes Relative: 8 %
Neutro Abs: 4.3 10*3/uL (ref 1.7–7.7)
Neutrophils Relative %: 61 %
Platelets: 308 10*3/uL (ref 150–400)
RBC: 3.81 MIL/uL — ABNORMAL LOW (ref 3.87–5.11)
RDW: 14.9 % (ref 11.5–15.5)
WBC: 7.2 10*3/uL (ref 4.0–10.5)

## 2018-05-07 LAB — COMPREHENSIVE METABOLIC PANEL
ALT: 13 U/L — ABNORMAL LOW (ref 14–54)
AST: 23 U/L (ref 15–41)
Albumin: 4.1 g/dL (ref 3.5–5.0)
Alkaline Phosphatase: 106 U/L (ref 38–126)
Anion gap: 9 (ref 5–15)
BUN: 19 mg/dL (ref 6–20)
CO2: 26 mmol/L (ref 22–32)
Calcium: 9.9 mg/dL (ref 8.9–10.3)
Chloride: 107 mmol/L (ref 101–111)
Creatinine, Ser: 0.6 mg/dL (ref 0.44–1.00)
GFR calc Af Amer: >60 mL/min (ref >60)
GFR calc non Af Amer: >60 mL/min (ref >60)
Glucose, Bld: 179 mg/dL — ABNORMAL HIGH (ref 65–99)
Potassium: 4.6 mmol/L (ref 3.5–5.1)
Sodium: 142 mmol/L (ref 135–145)
Total Bilirubin: 1.1 mg/dL (ref 0.3–1.2)
Total Protein: 7.7 g/dL (ref 6.5–8.1)

## 2018-05-07 LAB — PROTIME-INR
INR: 1.16
Prothrombin Time: 14.7 seconds (ref 11.4–15.2)

## 2018-05-07 LAB — URINALYSIS, ROUTINE W REFLEX MICROSCOPIC
Bacteria, UA: NONE SEEN
Bilirubin Urine: NEGATIVE
Glucose, UA: NEGATIVE mg/dL
Hgb urine dipstick: NEGATIVE
Ketones, ur: NEGATIVE mg/dL
Nitrite: NEGATIVE
Protein, ur: NEGATIVE mg/dL
Specific Gravity, Urine: 1.008 (ref 1.005–1.030)
pH: 7 (ref 5.0–8.0)

## 2018-05-07 LAB — APTT: aPTT: 32 seconds (ref 24–36)

## 2018-05-07 LAB — SURGICAL PCR SCREEN
MRSA, PCR: NEGATIVE
Staphylococcus aureus: POSITIVE — AB

## 2018-05-07 NOTE — Progress Notes (Addendum)
EKG-03/16/2018-epic HGBA1C-03/16/18-epic  Clearance on chart - D Carroll,NP- Eliquis instructions on chart  Dr Forde Dandy- 01/28/2018 - clearance on chart  ECHO- 06/2017-epic  LOVButch Penny Carroll,NP-card- 06/2017-epic

## 2018-05-07 NOTE — Progress Notes (Addendum)
LVMM for [employer] requesting cardiac clearance.   - have received cldearance from Dr Forde Dandy which is on chart .  Have received Eliquis instructions from Roderic Palau, NP which is on a clearance form but does state this is not a cardiac clearance.

## 2018-05-07 NOTE — Progress Notes (Signed)
Echo, LOV with caridology of 06/2017 , Clearance from Dr Forde Dandy, taken to Anesthesia, Dr Marcie Bal.  Dr Marcie Bal looked over chart and previous anesthesia record from 03/2018 in epic and stated patient was okay for surgery.  Called and LVMM for [employer].

## 2018-05-07 NOTE — Progress Notes (Signed)
Patient made aware nasal swab positive for staph and she would be receive nasal betadine am of surgery.  Patient voiced understanding.

## 2018-05-07 NOTE — Progress Notes (Signed)
CBC/DIFF done 05/07/2018 routed via epic to dr Wynelle Link.

## 2018-05-07 NOTE — Progress Notes (Addendum)
U/A done 05/07/2018  faxed via epic to Dr Wynelle Link.

## 2018-05-09 DIAGNOSIS — I4891 Unspecified atrial fibrillation: Secondary | ICD-10-CM | POA: Insufficient documentation

## 2018-05-09 MED ORDER — BUPIVACAINE LIPOSOME 1.3 % IJ SUSP
20.0000 mL | Freq: Once | INTRAMUSCULAR | Status: DC
  Filled 2018-05-09: qty 20

## 2018-05-10 ENCOUNTER — Inpatient Hospital Stay (HOSPITAL_COMMUNITY)
Admission: RE | Admit: 2018-05-10 | Discharge: 2018-05-17 | DRG: 470 | Disposition: A | Discharge status: Skilled Nursing Facility | Payer: Worker's Compensation | Source: Ambulatory Visit | Attending: Orthopedic Surgery | Admitting: Orthopedic Surgery

## 2018-05-10 ENCOUNTER — Inpatient Hospital Stay (HOSPITAL_COMMUNITY): Payer: Worker's Compensation | Admitting: Anesthesiology

## 2018-05-10 ENCOUNTER — Inpatient Hospital Stay (HOSPITAL_COMMUNITY): Payer: Worker's Compensation

## 2018-05-10 ENCOUNTER — Encounter (HOSPITAL_COMMUNITY)
Admission: RE | Disposition: A | Discharge status: Skilled Nursing Facility | Payer: Self-pay | Source: Ambulatory Visit | Attending: Orthopedic Surgery

## 2018-05-10 ENCOUNTER — Encounter (HOSPITAL_COMMUNITY): Payer: Self-pay | Admitting: *Deleted

## 2018-05-10 DIAGNOSIS — Z9842 Cataract extraction status, left eye: Secondary | ICD-10-CM

## 2018-05-10 DIAGNOSIS — I10 Essential (primary) hypertension: Secondary | ICD-10-CM | POA: Diagnosis present

## 2018-05-10 DIAGNOSIS — M1710 Unilateral primary osteoarthritis, unspecified knee: Secondary | ICD-10-CM | POA: Diagnosis present

## 2018-05-10 DIAGNOSIS — Z91048 Other nonmedicinal substance allergy status: Secondary | ICD-10-CM | POA: Diagnosis not present

## 2018-05-10 DIAGNOSIS — T1490XS Injury, unspecified, sequela: Secondary | ICD-10-CM

## 2018-05-10 DIAGNOSIS — X58XXXS Exposure to other specified factors, sequela: Secondary | ICD-10-CM | POA: Diagnosis present

## 2018-05-10 DIAGNOSIS — Z888 Allergy status to other drugs, medicaments and biological substances status: Secondary | ICD-10-CM | POA: Diagnosis not present

## 2018-05-10 DIAGNOSIS — E119 Type 2 diabetes mellitus without complications: Secondary | ICD-10-CM | POA: Diagnosis present

## 2018-05-10 DIAGNOSIS — Z7901 Long term (current) use of anticoagulants: Secondary | ICD-10-CM

## 2018-05-10 DIAGNOSIS — Z9841 Cataract extraction status, right eye: Secondary | ICD-10-CM | POA: Diagnosis not present

## 2018-05-10 DIAGNOSIS — Z96641 Presence of right artificial hip joint: Secondary | ICD-10-CM | POA: Diagnosis present

## 2018-05-10 DIAGNOSIS — M1711 Unilateral primary osteoarthritis, right knee: Secondary | ICD-10-CM | POA: Diagnosis present

## 2018-05-10 DIAGNOSIS — Z419 Encounter for procedure for purposes other than remedying health state, unspecified: Secondary | ICD-10-CM

## 2018-05-10 DIAGNOSIS — I4891 Unspecified atrial fibrillation: Secondary | ICD-10-CM | POA: Diagnosis present

## 2018-05-10 DIAGNOSIS — M21371 Foot drop, right foot: Secondary | ICD-10-CM | POA: Diagnosis present

## 2018-05-10 DIAGNOSIS — Z6841 Body Mass Index (BMI) 40.0 and over, adult: Secondary | ICD-10-CM | POA: Diagnosis not present

## 2018-05-10 DIAGNOSIS — Z7984 Long term (current) use of oral hypoglycemic drugs: Secondary | ICD-10-CM

## 2018-05-10 DIAGNOSIS — M1731 Unilateral post-traumatic osteoarthritis, right knee: Principal | ICD-10-CM | POA: Diagnosis present

## 2018-05-10 DIAGNOSIS — Z79899 Other long term (current) drug therapy: Secondary | ICD-10-CM | POA: Diagnosis not present

## 2018-05-10 DIAGNOSIS — M21061 Valgus deformity, not elsewhere classified, right knee: Secondary | ICD-10-CM | POA: Diagnosis present

## 2018-05-10 DIAGNOSIS — M179 Osteoarthritis of knee, unspecified: Secondary | ICD-10-CM | POA: Diagnosis present

## 2018-05-10 DIAGNOSIS — D62 Acute posthemorrhagic anemia: Secondary | ICD-10-CM | POA: Diagnosis not present

## 2018-05-10 HISTORY — PX: TOTAL KNEE ARTHROPLASTY: SHX125

## 2018-05-10 LAB — GLUCOSE, CAPILLARY
GLUCOSE-CAPILLARY: 117 mg/dL — AB (ref 65–99)
GLUCOSE-CAPILLARY: 200 mg/dL — AB (ref 65–99)
Glucose-Capillary: 102 mg/dL — ABNORMAL HIGH (ref 65–99)

## 2018-05-10 SURGERY — ARTHROPLASTY, KNEE, TOTAL
Anesthesia: Spinal | Site: Knee | Laterality: Right

## 2018-05-10 MED ORDER — CYCLOBENZAPRINE HCL 10 MG PO TABS
10.0000 mg | ORAL_TABLET | Freq: Three times a day (TID) | ORAL | Status: DC | PRN
  Administered 2018-05-10 – 2018-05-16 (×11): 10 mg via ORAL
  Filled 2018-05-10 (×13): qty 1

## 2018-05-10 MED ORDER — PHENYLEPHRINE 40 MCG/ML (10ML) SYRINGE FOR IV PUSH (FOR BLOOD PRESSURE SUPPORT)
PREFILLED_SYRINGE | INTRAVENOUS | Status: AC
  Filled 2018-05-10: qty 10

## 2018-05-10 MED ORDER — DOCUSATE SODIUM 100 MG PO CAPS
100.0000 mg | ORAL_CAPSULE | Freq: Two times a day (BID) | ORAL | Status: DC
  Administered 2018-05-10 – 2018-05-17 (×14): 100 mg via ORAL
  Filled 2018-05-10 (×14): qty 1

## 2018-05-10 MED ORDER — OXYCODONE HCL 5 MG PO TABS
10.0000 mg | ORAL_TABLET | ORAL | Status: DC | PRN
  Administered 2018-05-10 – 2018-05-11 (×2): 15 mg via ORAL
  Filled 2018-05-10 (×2): qty 3

## 2018-05-10 MED ORDER — OXYCODONE HCL 5 MG PO TABS
5.0000 mg | ORAL_TABLET | ORAL | Status: DC | PRN
  Administered 2018-05-10: 5 mg via ORAL
  Filled 2018-05-10: qty 1

## 2018-05-10 MED ORDER — STERILE WATER FOR IRRIGATION IR SOLN
Status: DC | PRN
  Administered 2018-05-10: 2000 mL

## 2018-05-10 MED ORDER — SODIUM CHLORIDE 0.9 % IJ SOLN
INTRAMUSCULAR | Status: DC | PRN
  Administered 2018-05-10: 60 mL

## 2018-05-10 MED ORDER — ROPIVACAINE HCL 7.5 MG/ML IJ SOLN
INTRAMUSCULAR | Status: DC | PRN
  Administered 2018-05-10: 20 mL via PERINEURAL

## 2018-05-10 MED ORDER — BISACODYL 10 MG RE SUPP: 10.0000 mg | Freq: Every day | RECTAL | Status: DC | PRN

## 2018-05-10 MED ORDER — TRAMADOL HCL 50 MG PO TABS
50.0000 mg | ORAL_TABLET | Freq: Four times a day (QID) | ORAL | Status: DC | PRN
  Administered 2018-05-10 – 2018-05-12 (×4): 100 mg via ORAL
  Administered 2018-05-12: 50 mg via ORAL
  Filled 2018-05-10 (×2): qty 2
  Filled 2018-05-10: qty 1
  Filled 2018-05-10 (×2): qty 2

## 2018-05-10 MED ORDER — SODIUM CHLORIDE 0.9 % IJ SOLN
INTRAMUSCULAR | Status: AC
  Filled 2018-05-10: qty 50

## 2018-05-10 MED ORDER — PROPOFOL 10 MG/ML IV BOLUS
INTRAVENOUS | Status: AC
  Filled 2018-05-10: qty 20

## 2018-05-10 MED ORDER — HYDROMORPHONE HCL 1 MG/ML IJ SOLN
0.5000 mg | INTRAMUSCULAR | Status: DC | PRN
  Administered 2018-05-10 – 2018-05-11 (×2): 1 mg via INTRAVENOUS
  Filled 2018-05-10 (×3): qty 1

## 2018-05-10 MED ORDER — MENTHOL 3 MG MT LOZG: 1.0000 | LOZENGE | OROMUCOSAL | Status: DC | PRN

## 2018-05-10 MED ORDER — DEXAMETHASONE SODIUM PHOSPHATE 10 MG/ML IJ SOLN
10.0000 mg | Freq: Once | INTRAMUSCULAR | Status: AC
  Administered 2018-05-11: 10 mg via INTRAVENOUS
  Filled 2018-05-10: qty 1

## 2018-05-10 MED ORDER — GLYCOPYRROLATE 0.2 MG/ML IJ SOLN
INTRAMUSCULAR | Status: DC | PRN
  Administered 2018-05-10: 0.1 mg via INTRAVENOUS

## 2018-05-10 MED ORDER — FENTANYL CITRATE (PF) 100 MCG/2ML IJ SOLN
50.0000 ug | INTRAMUSCULAR | Status: DC
  Administered 2018-05-10: 100 ug via INTRAVENOUS
  Filled 2018-05-10: qty 2

## 2018-05-10 MED ORDER — GLYCOPYRROLATE 0.2 MG/ML IV SOSY
PREFILLED_SYRINGE | INTRAVENOUS | Status: AC
  Filled 2018-05-10: qty 5

## 2018-05-10 MED ORDER — SODIUM CHLORIDE 0.9 % IR SOLN
Status: DC | PRN
  Administered 2018-05-10: 1000 mL

## 2018-05-10 MED ORDER — HYDROMORPHONE HCL 1 MG/ML IJ SOLN
0.2500 mg | INTRAMUSCULAR | Status: DC | PRN
  Administered 2018-05-10 (×4): 0.5 mg via INTRAVENOUS

## 2018-05-10 MED ORDER — ONDANSETRON HCL 4 MG/2ML IJ SOLN
4.0000 mg | Freq: Four times a day (QID) | INTRAMUSCULAR | Status: DC | PRN
Start: 2018-05-10 — End: 2018-05-17
  Administered 2018-05-10: 4 mg via INTRAVENOUS
  Filled 2018-05-10: qty 2

## 2018-05-10 MED ORDER — ONDANSETRON HCL 4 MG/2ML IJ SOLN
INTRAMUSCULAR | Status: DC | PRN
  Administered 2018-05-10: 4 mg via INTRAVENOUS

## 2018-05-10 MED ORDER — RAMIPRIL 10 MG PO CAPS
20.0000 mg | ORAL_CAPSULE | Freq: Every evening | ORAL | Status: DC
  Administered 2018-05-11 – 2018-05-16 (×6): 20 mg via ORAL
  Filled 2018-05-10 (×8): qty 2

## 2018-05-10 MED ORDER — APIXABAN 2.5 MG PO TABS
2.5000 mg | ORAL_TABLET | Freq: Two times a day (BID) | ORAL | Status: DC
  Administered 2018-05-11 – 2018-05-17 (×13): 2.5 mg via ORAL
  Filled 2018-05-10 (×13): qty 1

## 2018-05-10 MED ORDER — ACETAMINOPHEN 10 MG/ML IV SOLN
1000.0000 mg | Freq: Four times a day (QID) | INTRAVENOUS | Status: DC
  Administered 2018-05-10: 1000 mg via INTRAVENOUS
  Filled 2018-05-10: qty 100

## 2018-05-10 MED ORDER — MIDAZOLAM HCL 5 MG/5ML IJ SOLN
INTRAMUSCULAR | Status: DC | PRN
  Administered 2018-05-10 (×2): 1 mg via INTRAVENOUS

## 2018-05-10 MED ORDER — FLEET ENEMA 7-19 GM/118ML RE ENEM: 1.0000 | ENEMA | Freq: Once | RECTAL | Status: DC | PRN

## 2018-05-10 MED ORDER — CHLORHEXIDINE GLUCONATE 4 % EX LIQD: 60.0000 mL | Freq: Once | CUTANEOUS | Status: DC

## 2018-05-10 MED ORDER — BUPIVACAINE IN DEXTROSE 0.75-8.25 % IT SOLN
INTRATHECAL | Status: DC | PRN
  Administered 2018-05-10: 1.6 mL via INTRATHECAL

## 2018-05-10 MED ORDER — HYDROMORPHONE HCL 1 MG/ML IJ SOLN
INTRAMUSCULAR | Status: AC
  Filled 2018-05-10: qty 2

## 2018-05-10 MED ORDER — ALBUMIN HUMAN 5 % IV SOLN
INTRAVENOUS | Status: DC | PRN
  Administered 2018-05-10: 16:00:00 via INTRAVENOUS

## 2018-05-10 MED ORDER — DEXAMETHASONE SODIUM PHOSPHATE 10 MG/ML IJ SOLN
8.0000 mg | Freq: Once | INTRAMUSCULAR | Status: AC
  Administered 2018-05-10: 10 mg via INTRAVENOUS

## 2018-05-10 MED ORDER — ONDANSETRON HCL 4 MG PO TABS
4.0000 mg | ORAL_TABLET | Freq: Four times a day (QID) | ORAL | Status: DC | PRN
  Administered 2018-05-11: 4 mg via ORAL
  Filled 2018-05-10: qty 1

## 2018-05-10 MED ORDER — DIPHENHYDRAMINE HCL 12.5 MG/5ML PO ELIX: 12.5000 mg | ORAL_SOLUTION | ORAL | Status: DC | PRN

## 2018-05-10 MED ORDER — POLYETHYLENE GLYCOL 3350 17 G PO PACK
17.0000 g | PACK | Freq: Every day | ORAL | Status: DC | PRN
  Administered 2018-05-15: 17 g via ORAL
  Filled 2018-05-10: qty 1

## 2018-05-10 MED ORDER — PROPOFOL 10 MG/ML IV BOLUS
INTRAVENOUS | Status: DC | PRN
  Administered 2018-05-10: 50 mg via INTRAVENOUS

## 2018-05-10 MED ORDER — PROPOFOL 500 MG/50ML IV EMUL
INTRAVENOUS | Status: DC | PRN
  Administered 2018-05-10: 75 ug/kg/min via INTRAVENOUS

## 2018-05-10 MED ORDER — SIMVASTATIN 20 MG PO TABS
40.0000 mg | ORAL_TABLET | Freq: Every evening | ORAL | Status: DC
  Administered 2018-05-11 – 2018-05-16 (×6): 40 mg via ORAL
  Filled 2018-05-10 (×6): qty 2

## 2018-05-10 MED ORDER — PHENYLEPHRINE HCL 10 MG/ML IJ SOLN
INTRAMUSCULAR | Status: AC
  Filled 2018-05-10: qty 1

## 2018-05-10 MED ORDER — SODIUM CHLORIDE 0.9 % IV SOLN
1000.0000 mg | INTRAVENOUS | Status: AC
  Administered 2018-05-10: 1000 mg via INTRAVENOUS
  Filled 2018-05-10: qty 1100

## 2018-05-10 MED ORDER — FENTANYL CITRATE (PF) 100 MCG/2ML IJ SOLN
INTRAMUSCULAR | Status: DC | PRN
  Administered 2018-05-10: 25 ug via INTRAVENOUS
  Administered 2018-05-10: 50 ug via INTRAVENOUS
  Administered 2018-05-10: 25 ug via INTRAVENOUS

## 2018-05-10 MED ORDER — CEFAZOLIN SODIUM-DEXTROSE 2-4 GM/100ML-% IV SOLN
2.0000 g | INTRAVENOUS | Status: AC
  Administered 2018-05-10: 2 g via INTRAVENOUS
  Filled 2018-05-10: qty 100

## 2018-05-10 MED ORDER — ONDANSETRON HCL 4 MG/2ML IJ SOLN
INTRAMUSCULAR | Status: AC
  Filled 2018-05-10: qty 2

## 2018-05-10 MED ORDER — BUPIVACAINE LIPOSOME 1.3 % IJ SUSP
INTRAMUSCULAR | Status: DC | PRN
  Administered 2018-05-10: 20 mL

## 2018-05-10 MED ORDER — FENTANYL CITRATE (PF) 100 MCG/2ML IJ SOLN
INTRAMUSCULAR | Status: AC
  Filled 2018-05-10: qty 2

## 2018-05-10 MED ORDER — DEXAMETHASONE SODIUM PHOSPHATE 10 MG/ML IJ SOLN
INTRAMUSCULAR | Status: AC
  Filled 2018-05-10: qty 1

## 2018-05-10 MED ORDER — ACETAMINOPHEN 500 MG PO TABS
1000.0000 mg | ORAL_TABLET | Freq: Four times a day (QID) | ORAL | Status: AC
  Administered 2018-05-10 – 2018-05-11 (×4): 1000 mg via ORAL
  Filled 2018-05-10 (×4): qty 2

## 2018-05-10 MED ORDER — LACTATED RINGERS IV SOLN
INTRAVENOUS | Status: DC
  Administered 2018-05-10 (×3): via INTRAVENOUS

## 2018-05-10 MED ORDER — PROPOFOL 10 MG/ML IV BOLUS
INTRAVENOUS | Status: AC
  Filled 2018-05-10: qty 60

## 2018-05-10 MED ORDER — SODIUM CHLORIDE 0.9 % IJ SOLN
INTRAMUSCULAR | Status: AC
  Filled 2018-05-10: qty 10

## 2018-05-10 MED ORDER — PHENYLEPHRINE HCL 10 MG/ML IJ SOLN
INTRAVENOUS | Status: DC | PRN
  Administered 2018-05-10: 25 ug/min via INTRAVENOUS

## 2018-05-10 MED ORDER — METOCLOPRAMIDE HCL 5 MG/ML IJ SOLN
5.0000 mg | Freq: Three times a day (TID) | INTRAMUSCULAR | Status: DC | PRN
  Administered 2018-05-11: 10 mg via INTRAVENOUS
  Filled 2018-05-10: qty 2

## 2018-05-10 MED ORDER — MIDAZOLAM HCL 2 MG/2ML IJ SOLN
INTRAMUSCULAR | Status: AC
  Filled 2018-05-10: qty 2

## 2018-05-10 MED ORDER — SODIUM CHLORIDE 0.9 % IV SOLN
INTRAVENOUS | Status: DC
  Administered 2018-05-10: 19:00:00 via INTRAVENOUS

## 2018-05-10 MED ORDER — METOCLOPRAMIDE HCL 5 MG PO TABS: 5.0000 mg | ORAL_TABLET | Freq: Three times a day (TID) | ORAL | Status: DC | PRN

## 2018-05-10 MED ORDER — CEFAZOLIN SODIUM-DEXTROSE 2-4 GM/100ML-% IV SOLN
2.0000 g | Freq: Four times a day (QID) | INTRAVENOUS | Status: AC
  Administered 2018-05-10 – 2018-05-11 (×2): 2 g via INTRAVENOUS
  Filled 2018-05-10 (×2): qty 100

## 2018-05-10 MED ORDER — MIDAZOLAM HCL 2 MG/2ML IJ SOLN
1.0000 mg | INTRAMUSCULAR | Status: DC
  Filled 2018-05-10: qty 2

## 2018-05-10 MED ORDER — PHENOL 1.4 % MT LIQD
1.0000 | OROMUCOSAL | Status: DC | PRN
  Filled 2018-05-10: qty 177

## 2018-05-10 MED ORDER — ACETAMINOPHEN 325 MG PO TABS
325.0000 mg | ORAL_TABLET | Freq: Four times a day (QID) | ORAL | Status: DC | PRN
  Administered 2018-05-13: 650 mg via ORAL
  Filled 2018-05-10 (×2): qty 2

## 2018-05-10 MED ORDER — TRANEXAMIC ACID 1000 MG/10ML IV SOLN
1000.0000 mg | Freq: Once | INTRAVENOUS | Status: AC
  Administered 2018-05-10: 1000 mg via INTRAVENOUS
  Filled 2018-05-10: qty 1100

## 2018-05-10 SURGICAL SUPPLY — 72 items
ADAPTER FEMORAL BOLT NEUTRAL (Knees) ×2 IMPLANT
ADPR FEM 5D STRL KN PFC SGM (Orthopedic Implant) ×1 IMPLANT
ADPR FEM NTRL BOLT STRL KN PFC (Knees) ×1 IMPLANT
AUG FEM 4 4 STRL LF KN RT (Knees) ×1 IMPLANT
AUGMENT POSTEERIOR PFC SZ4 RT (Knees) ×1 IMPLANT
BAG DECANTER FOR FLEXI CONT (MISCELLANEOUS) ×2 IMPLANT
BAG SPEC THK2 15X12 ZIP CLS (MISCELLANEOUS)
BAG ZIPLOCK 12X15 (MISCELLANEOUS) IMPLANT
BANDAGE ACE 6X5 VEL STRL LF (GAUZE/BANDAGES/DRESSINGS) ×2 IMPLANT
BLADE SAG 18X100X1.27 (BLADE) ×2 IMPLANT
BLADE SAW SGTL 11.0X1.19X90.0M (BLADE) ×2 IMPLANT
BONE CEMENT GENTAMICIN (Cement) ×6 IMPLANT
CAP KNEE TOTAL 3 SIGMA ×2 IMPLANT
CEMENT BONE GENTAMICIN 40 (Cement) ×3 IMPLANT
CEMENT RESTRICTOR DEPUY SZ 4 (Cement) ×4 IMPLANT
CLOTH BEACON ORANGE TIMEOUT ST (SAFETY) ×2 IMPLANT
COMP FEM CEM RT SZ4 (Orthopedic Implant) ×2 IMPLANT
COMPONENT FEM CEM RT SZ4 (Orthopedic Implant) ×1 IMPLANT
COVER SURGICAL LIGHT HANDLE (MISCELLANEOUS) ×2 IMPLANT
CUFF TOURN SGL QUICK 34 (TOURNIQUET CUFF) ×2
CUFF TRNQT CYL 34X4X40X1 (TOURNIQUET CUFF) ×1 IMPLANT
DECANTER SPIKE VIAL GLASS SM (MISCELLANEOUS) IMPLANT
DRAPE U-SHAPE 47X51 STRL (DRAPES) ×2 IMPLANT
DRSG ADAPTIC 3X8 NADH LF (GAUZE/BANDAGES/DRESSINGS) ×2 IMPLANT
DRSG PAD ABDOMINAL 8X10 ST (GAUZE/BANDAGES/DRESSINGS) ×2 IMPLANT
DURAPREP 26ML APPLICATOR (WOUND CARE) ×2 IMPLANT
ELECT REM PT RETURN 15FT ADLT (MISCELLANEOUS) ×2 IMPLANT
EVACUATOR 1/8 PVC DRAIN (DRAIN) ×2 IMPLANT
FEMORAL ADAPTER (Orthopedic Implant) ×2 IMPLANT
GAUZE SPONGE 4X4 12PLY STRL (GAUZE/BANDAGES/DRESSINGS) ×2 IMPLANT
GLOVE BIO SURGEON STRL SZ7.5 (GLOVE) ×2 IMPLANT
GLOVE BIO SURGEON STRL SZ8 (GLOVE) ×2 IMPLANT
GLOVE BIOGEL PI IND STRL 7.5 (GLOVE) ×6 IMPLANT
GLOVE BIOGEL PI IND STRL 8 (GLOVE) ×1 IMPLANT
GLOVE BIOGEL PI INDICATOR 7.5 (GLOVE) ×6
GLOVE BIOGEL PI INDICATOR 8 (GLOVE) ×1
GLOVE SURG SS PI 6.5 STRL IVOR (GLOVE) IMPLANT
GOWN STRL REUS W/TWL LRG LVL3 (GOWN DISPOSABLE) ×8 IMPLANT
GOWN STRL REUS W/TWL XL LVL3 (GOWN DISPOSABLE) ×2 IMPLANT
GUIDEWIRE BEAD TIP (WIRE) ×2 IMPLANT
HANDPIECE INTERPULSE COAX TIP (DISPOSABLE) ×2
HOLDER FOLEY CATH W/STRAP (MISCELLANEOUS) IMPLANT
IMMOBILIZER KNEE 20 (SOFTGOODS) ×4 IMPLANT
IMMOBILIZER KNEE 20 THIGH 36 (SOFTGOODS) ×1 IMPLANT
INSERT TIBIAL RP TCS SZ 4.0 (Knees) ×2 IMPLANT
MANIFOLD NEPTUNE II (INSTRUMENTS) ×2 IMPLANT
NDL SAFETY ECLIPSE 18X1.5 (NEEDLE) IMPLANT
NEEDLE HYPO 18GX1.5 SHARP (NEEDLE)
NS IRRIG 1000ML POUR BTL (IV SOLUTION) ×2 IMPLANT
PACK TOTAL KNEE CUSTOM (KITS) ×2 IMPLANT
PADDING CAST COTTON 6X4 STRL (CAST SUPPLIES) ×2 IMPLANT
POSITIONER SURGICAL ARM (MISCELLANEOUS) ×2 IMPLANT
POSTERIOR AUGMENT PFC SZ4 RT (Knees) ×2 IMPLANT
SET HNDPC FAN SPRY TIP SCT (DISPOSABLE) ×1 IMPLANT
STEM TIBIA PFC 13X30MM (Stem) ×2 IMPLANT
STEM UNIV REV 115 X 6 FLUTED (Stem) ×2 IMPLANT
STRIP CLOSURE SKIN 1/2X4 (GAUZE/BANDAGES/DRESSINGS) ×2 IMPLANT
SUT STRATAFIX 0 PDS 27 VIOLET (SUTURE) ×2
SUT VIC AB 2-0 CT1 27 (SUTURE) ×6
SUT VIC AB 2-0 CT1 TAPERPNT 27 (SUTURE) ×3 IMPLANT
SUTURE STRATFX 0 PDS 27 VIOLET (SUTURE) ×1 IMPLANT
SWAB COLLECTION DEVICE MRSA (MISCELLANEOUS) IMPLANT
SWAB CULTURE ESWAB REG 1ML (MISCELLANEOUS) IMPLANT
SYR 3ML LL SCALE MARK (SYRINGE) IMPLANT
SYR 50ML LL SCALE MARK (SYRINGE) ×4 IMPLANT
TOWER CARTRIDGE SMART MIX (DISPOSABLE) ×2 IMPLANT
TRAY FOLEY CATH 14FR (SET/KITS/TRAYS/PACK) ×2 IMPLANT
TRAY REVISION SZ 4 (Knees) ×2 IMPLANT
TRAY SLEEVE CEM ML (Knees) ×2 IMPLANT
TUBE KAMVAC SUCTION (TUBING) IMPLANT
WATER STERILE IRR 1000ML POUR (IV SOLUTION) ×2 IMPLANT
WRAP KNEE MAXI GEL POST OP (GAUZE/BANDAGES/DRESSINGS) ×2 IMPLANT

## 2018-05-10 NOTE — Anesthesia Procedure Notes (Signed)
Procedure Name: MAC Date/Time: 05/10/2018 1:52 PM Performed by: Lissa Morales, CRNA Pre-anesthesia Checklist: Patient identified, Emergency Drugs available, Patient being monitored, Timeout performed and Suction available Patient Re-evaluated:Patient Re-evaluated prior to induction Oxygen Delivery Method: Simple face mask Placement Confirmation: positive ETCO2

## 2018-05-10 NOTE — Brief Op Note (Signed)
05/10/2018  4:33 PM  PATIENT:  Kelly [name]  72 y.o. female  PRE-OPERATIVE DIAGNOSIS:  Osteoarthritis Right knee  POST-OPERATIVE DIAGNOSIS:  Osteoarthritis Right knee  PROCEDURE:  Procedure(s): RIGHT TOTAL KNEE ARTHROPLASTY; possible femoral osteotomy (Right)  SURGEON:  Surgeon(s) and Role:    Gaynelle Arabian, MD - Primary  PHYSICIAN ASSISTANT:   ASSISTANTS: Ardeen Jourdain, PA-C   ANESTHESIA:   Adductor canal block and spinal  EBL:  450 mL   BLOOD ADMINISTERED:none  DRAINS: (Medium) Hemovact drain(s) in the right knee with  Suction Open   LOCAL MEDICATIONS USED:  OTHER Exparel  COUNTS:  YES  TOURNIQUET:   Total Tourniquet Time Documented: Thigh (Right) - 2 minutes Total: Thigh (Right) - 2 minutes   DICTATION: .Other Dictation: Dictation Number [id number]  PLAN OF CARE: Admit to inpatient   PATIENT DISPOSITION:  PACU - hemodynamically stable.

## 2018-05-10 NOTE — Interval H&P Note (Signed)
History and Physical Interval Note:  05/10/2018 12:56 PM  Kelly [name]  has presented today for surgery, with the diagnosis of Osteoarthritis Right knee  The various methods of treatment have been discussed with the patient and family. After consideration of risks, benefits and other options for treatment, the patient has consented to  Procedure(s): RIGHT TOTAL KNEE ARTHROPLASTY; possible femoral osteotomy (Right) as a surgical intervention .  The patient's history has been reviewed, patient examined, no change in status, stable for surgery.  I have reviewed the patient's chart and labs.  Questions were answered to the patient's satisfaction.     Pilar Plate Diedra Sinor

## 2018-05-10 NOTE — Progress Notes (Signed)
AssistedDr. Edmond Fitzgerald with right, ultrasound guided, adductor canal block. Side rails up, monitors on throughout procedure. See vital signs in flow sheet. Tolerated Procedure well.  

## 2018-05-10 NOTE — Anesthesia Procedure Notes (Signed)
Spinal  Patient location during procedure: OR End time: 05/10/2018 2:00 PM Staffing Resident/CRNA: Lissa Morales, CRNA Preanesthetic Checklist Completed: patient identified, site marked, surgical consent, pre-op evaluation, timeout performed, IV checked, risks and benefits discussed and monitors and equipment checked Spinal Block Patient position: sitting Prep: DuraPrep Patient monitoring: heart rate, continuous pulse ox and blood pressure Approach: midline Location: L4-5 Injection technique: single-shot Needle Needle type: Pencan  Needle gauge: 24 G Needle length: 9 cm Assessment Sensory level: T8 Additional Notes Expiration date of kit checked and confirmed. Patient tolerated procedure well, without complications.

## 2018-05-10 NOTE — Anesthesia Procedure Notes (Signed)
Anesthesia Regional Block: Adductor canal block   Pre-Anesthetic Checklist: ,, timeout performed, Correct Patient, Correct Site, Correct Laterality, Correct Procedure, Correct Position, site marked, Risks and benefits discussed, pre-op evaluation,  At surgeon's request and post-op pain management  Laterality: Right  Prep: Maximum Sterile Barrier Precautions used, chloraprep       Needles:  Injection technique: Single-shot  Needle Type: Echogenic Stimulator Needle     Needle Length: 9cm  Needle Gauge: 21     Additional Needles:   Procedures:,,,, ultrasound used (permanent image in chart),,,,  Narrative:  Start time: 05/10/2018 1:12 PM End time: 05/10/2018 1:22 PM Injection made incrementally with aspirations every 5 mL.  Performed by: Personally  Anesthesiologist: Roderic Palau, MD  Additional Notes: 2% Lidocaine skin wheel.

## 2018-05-10 NOTE — Transfer of Care (Signed)
Immediate Anesthesia Transfer of Care Note  Patient: Kelly [name]  Procedure(s) Performed: RIGHT TOTAL KNEE ARTHROPLASTY; possible femoral osteotomy (Right Knee)  Patient Location: PACU  Anesthesia Type:Spinal  Level of Consciousness: awake, alert , oriented and patient cooperative  Airway & Oxygen Therapy: Patient Spontanous Breathing and Patient connected to face mask oxygen  Post-op Assessment: Report given to RN and Post -op Vital signs reviewed and stable  Post vital signs: stable  Last Vitals:  Vitals Value Taken Time  BP 125/82 05/10/2018  4:47 PM  Temp 36.4 C 05/10/2018  4:45 PM  Pulse    Resp 19 05/10/2018  4:53 PM  SpO2 100 % 05/10/2018  4:45 PM  Vitals shown include unvalidated device data.  Last Pain:  Vitals:   05/10/18 1328  TempSrc:   PainSc: 0-No pain         Complications: No apparent anesthesia complications

## 2018-05-10 NOTE — Discharge Instructions (Addendum)
° °Dr. Frank Aluisio °Total Joint Specialist °[facility] °[address] °[place], [place] [address] °[phone] ° °TOTAL KNEE REPLACEMENT POSTOPERATIVE DIRECTIONS ° °Knee Rehabilitation, Guidelines Following Surgery  °Results after knee surgery are often greatly improved when you follow the exercise, range of motion and muscle strengthening exercises prescribed by your doctor. Safety measures are also important to protect the knee from further injury. Any time any of these exercises cause you to have increased pain or swelling in your knee joint, decrease the amount until you are comfortable again and slowly increase them. If you have problems or questions, call your caregiver or physical therapist for advice.  ° °HOME CARE INSTRUCTIONS  °Remove items at home which could result in a fall. This includes throw rugs or furniture in walking pathways.  °· ICE to the affected knee every three hours for 30 minutes at a time and then as needed for pain and swelling.  Continue to use ice on the knee for pain and swelling from surgery. You may notice swelling that will progress down to the foot and ankle.  This is normal after surgery.  Elevate the leg when you are not up walking on it.   °· Continue to use the breathing machine which will help keep your temperature down.  It is common for your temperature to cycle up and down following surgery, especially at night when you are not up moving around and exerting yourself.  The breathing machine keeps your lungs expanded and your temperature down. °· Do not place pillow under knee, focus on keeping the knee straight while resting ° °DIET °You may resume your previous home diet once your are discharged from the hospital. ° °DRESSING / WOUND CARE / SHOWERING °You may change your dressing every day with sterile gauze.  Please use good hand washing techniques before changing the dressing.  Do not use any lotions or creams on the incision until instructed by your  surgeon. °You may start showering once you are discharged home but do not submerge the incision under water. Just pat the incision dry and apply a dry gauze dressing on daily. °Change the surgical dressing daily and reapply a dry dressing each time. ° °ACTIVITY °Walk with your walker as instructed. °Use walker as long as suggested by your caregivers. °Avoid periods of inactivity such as sitting longer than an hour when not asleep. This helps prevent blood clots.  °You may resume a sexual relationship in one month or when given the OK by your doctor.  °You may return to work once you are cleared by your doctor.  °Do not drive a car for 6 weeks or until released by you surgeon.  °Do not drive while taking narcotics. ° °WEIGHT BEARING °Weight bearing as tolerated with assist device (walker, cane, etc) as directed, use it as long as suggested by your surgeon or therapist, typically at least 4-6 weeks. ° °POSTOPERATIVE CONSTIPATION PROTOCOL °Constipation - defined medically as fewer than three stools per week and severe constipation as less than one stool per week. ° °One of the most common issues patients have following surgery is constipation.  Even if you have a regular bowel pattern at home, your normal regimen is likely to be disrupted due to multiple reasons following surgery.  Combination of anesthesia, postoperative narcotics, change in appetite and fluid intake all can affect your bowels.  In order to avoid complications following surgery, here are some recommendations in order to help you during your recovery period. ° °  Colace (docusate) - Pick up an over-the-counter form of Colace or another stool softener and take twice a day as long as you are requiring postoperative pain medications.  Take with a full glass of water daily.  If you experience loose stools or diarrhea, hold the colace until you stool forms back up.  If your symptoms do not get better within 1 week or if they get worse, check with your  doctor. ° °Dulcolax (bisacodyl) - Pick up over-the-counter and take as directed by the product packaging as needed to assist with the movement of your bowels.  Take with a full glass of water.  Use this product as needed if not relieved by Colace only.  ° °MiraLax (polyethylene glycol) - Pick up over-the-counter to have on hand.  MiraLax is a solution that will increase the amount of water in your bowels to assist with bowel movements.  Take as directed and can mix with a glass of water, juice, soda, coffee, or tea.  Take if you go more than two days without a movement. °Do not use MiraLax more than once per day. Call your doctor if you are still constipated or irregular after using this medication for 7 days in a row. ° °If you continue to have problems with postoperative constipation, please contact the office for further assistance and recommendations.  If you experience "the worst abdominal pain ever" or develop nausea or vomiting, please contact the office immediatly for further recommendations for treatment. ° °ITCHING ° If you experience itching with your medications, try taking only a single pain pill, or even half a pain pill at a time.  You can also use Benadryl over the counter for itching or also to help with sleep.  ° °TED HOSE STOCKINGS °Wear the elastic stockings on both legs for three weeks following surgery during the day but you may remove then at night for sleeping. ° °MEDICATIONS °See your medication summary on the “After Visit Summary” that the nursing staff will review with you prior to discharge.  You may have some home medications which will be placed on hold until you complete the course of blood thinner medication.  It is important for you to complete the blood thinner medication as prescribed by your surgeon.  Continue your approved medications as instructed at time of discharge. ° °PRECAUTIONS °If you experience chest pain or shortness of breath - call 911 immediately for transfer to the  hospital emergency department.  °If you develop a fever greater that 101 F, purulent drainage from wound, increased redness or drainage from wound, foul odor from the wound/dressing, or calf pain - CONTACT YOUR SURGEON.   °                                                °FOLLOW-UP APPOINTMENTS °Make sure you keep all of your appointments after your operation with your surgeon and caregivers. You should call the office at the above phone number and make an appointment for approximately two weeks after the date of your surgery or on the date instructed by your surgeon outlined in the "After Visit Summary". ° ° °RANGE OF MOTION AND STRENGTHENING EXERCISES  °Rehabilitation of the knee is important following a knee injury or an operation. After just a few days of immobilization, the muscles of the thigh which control the knee become weakened and   shrink (atrophy). Knee exercises are designed to build up the tone and strength of the thigh muscles and to improve knee motion. Often times heat used for twenty to thirty minutes before working out will loosen up your tissues and help with improving the range of motion but do not use heat for the first two weeks following surgery. These exercises can be done on a training (exercise) mat, on the floor, on a table or on a bed. Use what ever works the best and is most comfortable for you Knee exercises include:  Leg Lifts - While your knee is still immobilized in a splint or cast, you can do straight leg raises. Lift the leg to 60 degrees, hold for 3 sec, and slowly lower the leg. Repeat 10-20 times 2-3 times daily. Perform this exercise against resistance later as your knee gets better.  Quad and Hamstring Sets - Tighten up the muscle on the front of the thigh (Quad) and hold for 5-10 sec. Repeat this 10-20 times hourly. Hamstring sets are done by pushing the foot backward against an object and holding for 5-10 sec. Repeat as with quad sets.   Leg Slides: Lying on your back,  slowly slide your foot toward your buttocks, bending your knee up off the floor (only go as far as is comfortable). Then slowly slide your foot back down until your leg is flat on the floor again.  Angel Wings: Lying on your back spread your legs to the side as far apart as you can without causing discomfort.  A rehabilitation program following serious knee injuries can speed recovery and prevent re-injury in the future due to weakened muscles. Contact your doctor or a physical therapist for more information on knee rehabilitation.   IF YOU ARE TRANSFERRED TO A SKILLED REHAB FACILITY If the patient is transferred to a skilled rehab facility following release from the hospital, a list of the current medications will be sent to the facility for the patient to continue.  When discharged from the skilled rehab facility, please have the facility set up the patient's [place] prior to being released. Also, the skilled facility will be responsible for providing the patient with their medications at time of release from the facility to include their pain medication, the muscle relaxants, and their blood thinner medication. If the patient is still at the rehab facility at time of the two week follow up appointment, the skilled rehab facility will also need to assist the patient in arranging follow up appointment in our office and any transportation needs.  MAKE SURE YOU:  Understand these instructions.  Get help right away if you are not doing well or get worse.    Pick up stool softner and laxative for home use following surgery while on pain medications. Do not submerge incision under water. Please use good hand washing techniques while changing dressing each day. May shower starting three days after surgery. Please use a clean towel to pat the incision dry following showers. Continue to use ice for pain and swelling after surgery. Do not use any lotions or creams on the incision  until instructed by your surgeon.  Information on my medicine - ELIQUIS (apixaban)   Why was Eliquis prescribed for you? Eliquis was prescribed for you to reduce the risk of blood clots forming after orthopedic surgery.    What do You need to know about Eliquis? Take your Eliquis TWICE DAILY - one tablet in the morning and one tablet in  the evening with or without food.  It would be best to take the dose about the same time each day.  If you have difficulty swallowing the tablet whole please discuss with your pharmacist how to take the medication safely.  Take Eliquis exactly as prescribed by your doctor and DO NOT stop taking Eliquis without talking to the doctor who prescribed the medication.  Stopping without other medication to take the place of Eliquis may increase your risk of developing a clot.  After discharge, you should have regular check-up appointments with your healthcare provider that is prescribing your Eliquis.  What do you do if you miss a dose? If a dose of ELIQUIS is not taken at the scheduled time, take it as soon as possible on the same day and twice-daily administration should be resumed.  The dose should not be doubled to make up for a missed dose.  Do not take more than one tablet of ELIQUIS at the same time.  Important Safety Information A possible side effect of Eliquis is bleeding. You should call your healthcare provider right away if you experience any of the following: ? Bleeding from an injury or your nose that does not stop. ? Unusual colored urine (red or dark brown) or unusual colored stools (red or black). ? Unusual bruising for unknown reasons. ? A serious fall or if you hit your head (even if there is no bleeding).  Some medicines may interact with Eliquis and might increase your risk of bleeding or clotting while on Eliquis. To help avoid this, consult your healthcare provider or pharmacist prior to using any new prescription or  non-prescription medications, including herbals, vitamins, non-steroidal anti-inflammatory drugs (NSAIDs) and supplements.  This website has more information on Eliquis (apixaban): http://www.eliquis.com/eliquis/home

## 2018-05-10 NOTE — Anesthesia Postprocedure Evaluation (Signed)
Anesthesia Post Note  Patient: Kelly [name]  Procedure(s) Performed: RIGHT TOTAL KNEE ARTHROPLASTY; possible femoral osteotomy (Right Knee)     Patient location during evaluation: PACU Anesthesia Type: Spinal and Regional Level of consciousness: oriented and awake and alert Pain management: pain level controlled Vital Signs Assessment: post-procedure vital signs reviewed and stable Respiratory status: spontaneous breathing, respiratory function stable and patient connected to nasal cannula oxygen Cardiovascular status: blood pressure returned to baseline and stable Postop Assessment: no headache, no backache, no apparent nausea or vomiting, patient able to bend at knees and spinal receding Anesthetic complications: no    Last Vitals:  Vitals:   05/10/18 1925 05/10/18 2139  BP: (!) 164/95 (!) 144/77  Pulse: 89 82  Resp: 16 16  Temp: (!) 36.4 C 36.8 C  SpO2:  94%    Last Pain:  Vitals:   05/10/18 2139  TempSrc: Axillary  PainSc:                  [name],W. EDMOND

## 2018-05-10 NOTE — Op Note (Signed)
NAME: [name], [name] MEDICAL RECORD YP:[id number] ACCOUNT [id number] DATE OF BIRTH:1946-04-06 FACILITY: [facility] LOCATION: [facility] PHYSICIAN:Jariyah Hackley Zella Ball, MD  OPERATIVE REPORT  DATE OF PROCEDURE:  05/10/2018  PREOPERATIVE DIAGNOSES:  Posttraumatic osteoarthritis, right knee, with severe angular deformity.  POSTOPERATIVE DIAGNOSES:  Posttraumatic osteoarthritis, right knee, with severe angular deformity.  PROCEDURE:  Right total knee arthroplasty.  SURGEON:  Gaynelle Arabian, MD  ASSISTANT:  Ardeen Jourdain, PA-C  ANESTHESIA:  Spinal with adductor canal block.  ESTIMATED BLOOD LOSS:  450.  DRAINS:  Hemovac x1.  COMPLICATIONS:  None.  TOURNIQUET TIME:  No tourniquet used as it was not functional.  BRIEF CLINICAL NOTE:  The patient is a 72 year old female who had a supracondylar femur fracture several years ago and ended up healing it after being treated with a supracondylar nail but ended up healing with valgus in the supracondylar area.  She has  gone on to develop severe arthritis of the knee and has failed nonoperative management including injections of cortisone and viscous supplements.  She presents now for total knee arthroplasty.  She has already had the hardware removed.  PROCEDURE IN DETAIL:  After successful administration of adductor canal block and then spinal anesthetic, a tourniquet was placed high on her right thigh and right lower extremity prepped and draped in the usual sterile fashion.  Extremity was wrapped in  an Esmarch, the knee flexed, and tourniquet inflated to 300 mmHg.  A midline incision was made with a 10 blade through the subcutaneous tissue to the level of the extensor mechanism.  It was obvious that this is a venous tourniquet and the tourniquet  was not functioning.  We let the tourniquet down for a total time of 2 minutes.  We had minimal bleeding throughout the procedure.  The arthrotomy was completed and then soft tissue of the proximal medial  tibia subperiosteally elevated to the joint line  with a knife and into the semimembranosus bursa with a Cobb elevator.  Soft tissue laterally was elevated with attention being paid to avoid the patellar tendon on the tibial tubercle.  Patella was everted and knee flexed to 90 degrees.  She had severe  tricompartmental bone-on-bone osteoarthritis with significant valgus deformity.  I then used a drill to create a starting hole in the distal femur.  She was in such valgus that I had to aim the drill laterally in order to get into her canal.  There was a  pedestal there, so I had to drill and then placed a guide rod and then ream over the guide rod to get in the canal.  When I was in the canal, then I reamed up to 16 mm, placing the 16 mm reamer and using it that as our alignment rod.  The distal femoral  cutting guide was placed by pinning the block in 5 degrees of valgus to remove 10 mm distally.  There was still a 4 mm defect laterally, so I cut and I made a +4 position and was going to utilize a 4 mm distal augment.  Size 4 was the most appropriate  femur, and the AP cutting block was placed with the rotation marked off the epicondylar axis.  Anterior, posterior and chamfer cuts were made.  The tibia still looks forward and menisci removed.  Extramedullary tibial alignment guide was placed referencing proximally to the medial aspect of tibial tubercle and distal to the second metatarsal axis tibial crest.  There was minimal deformity of the  tibia.  A resection  was made with an oscillating saw.  Size 4 was the most appropriate tibial component, and then we prepared the proximal tibia with the modular drill, and a modular drill +13 x 30 stem extension for the MBT revision tray.  I then  prepared for a 29 mm broach for rotational control.  A trial MBT revision, size 4 tibia with 13 x 30 stem extension, 29 sleeve was then placed.  Note:  Given her severe deformity, I knew I was going to need a constrained  implant, and the only tibia to  match this constrained implant is the revision tibia.  We then completed the intercondylar cut for the femur.  A trial femur was placed, which is a size 4 TC3 femur with a 16 x 115 stem extension in a neutral position and a 4 mm distal lateral augment.  Trials were placed, and we got up to 15 mm, which  allowed for full extension with excellent varus/valgus and anterior/posterior balance throughout full range of motion.  Her severe valgus deformity was corrected back to neutral alignment.  X-rays were taken to confirm that the stem was in the femoral  canal and confirmed that we had excellent alignment.  The trials were removed, and the wounds were thoroughly irrigated with saline solution.  I trialed the cement restrictor on tibial sides, a size 4.  A size 4 cement restrictor was placed at the  appropriate depth in the tibial canal.  The cut bone surfaces were prepared with pulsatile lavage.  Cement was mixed, and once ready for implantation, a size 4 MBT revision tray with a 29 sleeve and 13 x 30 stem extension was cemented into place.  Then,  on the femoral side, we cemented distally with the size 4 TC3 femur with a 4 mm distal lateral augment, and the stem was pressfit, which is 16 x 115.  A trial 15 mm insert was placed and the knee held in full extension.  All extruded cement removed.   Note that we had prepared the patella earlier, and it is a 35 patella and that was cemented into place.  Once the cement was fully hardened, then the permanent 15 mm TC3 rotating platform insert was placed in the tibial tray.  The knee was copiously  irrigated with saline solution and the arthrotomy closed over a drain with a running 0 Stratafix suture.  Subcu was closed with interrupted 2-0 Vicryl and subcuticular running 4-0 Monocryl.  The drain was hooked to suction, incision clean and dry, and  then Steri-Strips and a bulky sterile dressing applied.  She was placed into a knee  immobilizer, awakened and transported to recovery in stable condition.  Note that a surgical assistant is a medical necessity for this very complex procedure.  Assistance was necessary for protection of vital neurovascular structures, as well as for proper positioning of the limb in order to reconstruct this significant  deformity.  LN/NUANCE  D:05/10/2018 T:05/10/2018 JOB:[id number]

## 2018-05-10 NOTE — Anesthesia Preprocedure Evaluation (Addendum)
Anesthesia Evaluation  Patient identified by MRN, date of birth, ID band Patient awake    Reviewed: Allergy & Precautions, H&P , NPO status , Patient's Chart, lab work & pertinent test results  Airway Mallampati: II  TM Distance: >3 FB Neck ROM: Full    Dental no notable dental hx. (+) Teeth Intact, Dental Advisory Given   Pulmonary neg pulmonary ROS,    Pulmonary exam normal breath sounds clear to auscultation       Cardiovascular hypertension, Pt. on medications + dysrhythmias Atrial Fibrillation  Rhythm:Regular Rate:Normal     Neuro/Psych negative neurological ROS  negative psych ROS   GI/Hepatic negative GI ROS, Neg liver ROS,   Endo/Other  diabetes, Type 2, Oral Hypoglycemic AgentsMorbid obesity  Renal/GU negative Renal ROS  negative genitourinary   Musculoskeletal   Abdominal   Peds  Hematology negative hematology ROS (+)   Anesthesia Other Findings   Reproductive/Obstetrics negative OB ROS                            Anesthesia Physical Anesthesia Plan  ASA: III  Anesthesia Plan: Spinal   Post-op Pain Management:  Regional for Post-op pain   Induction: Intravenous  PONV Risk Score and Plan: 3 and Ondansetron, Propofol infusion and Midazolam  Airway Management Planned: Simple Face Mask  Additional Equipment:   Intra-op Plan:   Post-operative Plan:   Informed Consent: I have reviewed the patients History and Physical, chart, labs and discussed the procedure including the risks, benefits and alternatives for the proposed anesthesia with the patient or authorized representative who has indicated his/her understanding and acceptance.   Dental advisory given  Plan Discussed with: CRNA  Anesthesia Plan Comments:         Anesthesia Quick Evaluation

## 2018-05-11 ENCOUNTER — Other Ambulatory Visit: Payer: Self-pay

## 2018-05-11 ENCOUNTER — Encounter (HOSPITAL_COMMUNITY): Payer: Self-pay | Admitting: Orthopedic Surgery

## 2018-05-11 LAB — CBC
HEMATOCRIT: 29.5 % — AB (ref 36.0–46.0)
Hemoglobin: 9.3 g/dL — ABNORMAL LOW (ref 12.0–15.0)
MCH: 28.7 pg (ref 26.0–34.0)
MCHC: 31.5 g/dL (ref 30.0–36.0)
MCV: 91 fL (ref 78.0–100.0)
PLATELETS: 274 10*3/uL (ref 150–400)
RBC: 3.24 MIL/uL — ABNORMAL LOW (ref 3.87–5.11)
RDW: 14.6 % (ref 11.5–15.5)
WBC: 10.1 10*3/uL (ref 4.0–10.5)

## 2018-05-11 LAB — BASIC METABOLIC PANEL
ANION GAP: 13 (ref 5–15)
BUN: 16 mg/dL (ref 6–20)
CALCIUM: 8.9 mg/dL (ref 8.9–10.3)
CO2: 24 mmol/L (ref 22–32)
CREATININE: 0.59 mg/dL (ref 0.44–1.00)
Chloride: 100 mmol/L — ABNORMAL LOW (ref 101–111)
Glucose, Bld: 231 mg/dL — ABNORMAL HIGH (ref 65–99)
Potassium: 4.1 mmol/L (ref 3.5–5.1)
SODIUM: 137 mmol/L (ref 135–145)

## 2018-05-11 LAB — GLUCOSE, CAPILLARY
GLUCOSE-CAPILLARY: 180 mg/dL — AB (ref 65–99)
GLUCOSE-CAPILLARY: 224 mg/dL — AB (ref 65–99)
Glucose-Capillary: 193 mg/dL — ABNORMAL HIGH (ref 65–99)
Glucose-Capillary: 200 mg/dL — ABNORMAL HIGH (ref 65–99)

## 2018-05-11 MED ORDER — GABAPENTIN 300 MG PO CAPS
300.0000 mg | ORAL_CAPSULE | Freq: Three times a day (TID) | ORAL | Status: DC
  Administered 2018-05-11 – 2018-05-17 (×19): 300 mg via ORAL
  Filled 2018-05-11 (×19): qty 1

## 2018-05-11 MED ORDER — INSULIN ASPART 100 UNIT/ML ~~LOC~~ SOLN
0.0000 [IU] | Freq: Three times a day (TID) | SUBCUTANEOUS | Status: DC
  Administered 2018-05-11: 5 [IU] via SUBCUTANEOUS
  Administered 2018-05-11: 3 [IU] via SUBCUTANEOUS
  Administered 2018-05-12: 2 [IU] via SUBCUTANEOUS
  Administered 2018-05-12 – 2018-05-13 (×2): 3 [IU] via SUBCUTANEOUS
  Administered 2018-05-13 (×2): 2 [IU] via SUBCUTANEOUS
  Administered 2018-05-14: 3 [IU] via SUBCUTANEOUS
  Administered 2018-05-14: 2 [IU] via SUBCUTANEOUS
  Administered 2018-05-14: 3 [IU] via SUBCUTANEOUS
  Administered 2018-05-15: 2 [IU] via SUBCUTANEOUS
  Administered 2018-05-15: 3 [IU] via SUBCUTANEOUS
  Administered 2018-05-15: 2 [IU] via SUBCUTANEOUS
  Administered 2018-05-16 – 2018-05-17 (×5): 3 [IU] via SUBCUTANEOUS

## 2018-05-11 MED ORDER — HYDROMORPHONE HCL 2 MG PO TABS
4.0000 mg | ORAL_TABLET | ORAL | Status: DC | PRN
  Administered 2018-05-11: 4 mg via ORAL
  Administered 2018-05-11: 6 mg via ORAL
  Administered 2018-05-11 (×2): 4 mg via ORAL
  Administered 2018-05-12 – 2018-05-13 (×2): 6 mg via ORAL
  Administered 2018-05-14 (×3): 4 mg via ORAL
  Administered 2018-05-15: 6 mg via ORAL
  Administered 2018-05-15: 4 mg via ORAL
  Administered 2018-05-15 – 2018-05-17 (×10): 6 mg via ORAL
  Filled 2018-05-11: qty 3
  Filled 2018-05-11: qty 2
  Filled 2018-05-11 (×2): qty 3
  Filled 2018-05-11 (×2): qty 2
  Filled 2018-05-11 (×3): qty 3
  Filled 2018-05-11: qty 2
  Filled 2018-05-11 (×6): qty 3
  Filled 2018-05-11: qty 2
  Filled 2018-05-11: qty 3
  Filled 2018-05-11: qty 2
  Filled 2018-05-11 (×2): qty 3

## 2018-05-11 MED ORDER — HYDROMORPHONE HCL 2 MG PO TABS
2.0000 mg | ORAL_TABLET | ORAL | Status: DC | PRN
  Administered 2018-05-12 – 2018-05-13 (×3): 2 mg via ORAL
  Filled 2018-05-11 (×3): qty 1

## 2018-05-11 NOTE — Progress Notes (Signed)
Physical Therapy Treatment Patient Details Name: Kelly [name] MRN: [id number] DOB: 1946-10-20 Today's Date: 05/11/2018    History of Present Illness 72 y.o. female with hx of R distal femur fx with IM nail and had hardware removed 03/2018, R THA 2014 and has had multiple R hip I&D admitted for R TKA (preop significant valgus deformity) and has right foot drop.    PT Comments    [employer] in room applying modified AFO on arrival.  Pt assisted with standing and attempted to take a few steps however unable to advance LEs despite multimodal cues.  Pt assisted back to bed and had difficulty pivoting as well.  Pt currently requiring max +2 assist for mobility.  Pt appears anxious and calling for family to assist.  Safe mobility discussed upon return to bed as well as allowing therapist to assist pt and following commands for safety.  Pt pleasant and agreeable.   Follow Up Recommendations  Follow surgeon's recommendation for DC plan and follow-up therapies     Equipment Recommendations  None recommended by PT    Recommendations for Other Services       Precautions / Restrictions Precautions Precautions: Fall;Knee Required Braces or Orthoses: Knee Immobilizer - Right;Other Brace/Splint Other Brace/Splint: shorter KI placed, AFO applied by [employer] on arrival Restrictions Other Position/Activity Restrictions: WBAT    Mobility  Bed Mobility Overal bed mobility: Needs Assistance Bed Mobility: Sit to Supine     Supine to sit: HOB elevated;Max assist Sit to supine: Mod assist   General bed mobility comments: assist for LEs onto bed  Transfers Overall transfer level: Needs assistance Equipment used: Rolling walker (2 wheeled) Transfers: Sit to/from [employer] Transfers Sit to Stand: Max assist;+2 physical assistance;+2 safety/equipment Stand pivot transfers: Max assist       General transfer comment: assist to rise and steady, verbal cues for UE and LE positioning, pt  assisted as able, difficulty placing R LE forward requiring assist, max multimodal cues for pivot back to bed, pt appears anxious and calling for family to assist  Ambulation/Gait Ambulation/Gait assistance: Mod assist;+2 safety/equipment;+2 physical assistance Ambulation Distance (Feet): 2 Feet Assistive device: Rolling walker (2 wheeled) Gait Pattern/deviations: Step-to pattern;Decreased stance time - right     General Gait Details: upon standing, pt attempted to take steps forward however LEs not cooperating, pt unable to position L LE appropriately despite cues, returned to recliner for safety as pt was attempting to sit down without notice, cued to wait for recliner underneath (recliner was following for safety)   Stairs             Wheelchair Mobility    Modified Rankin (Stroke Patients Only)       Balance           Standing balance support: Bilateral upper extremity supported Standing balance-Leahy Scale: Poor Standing balance comment: needs BUE support                            Cognition Arousal/Alertness: Awake/alert Behavior During Therapy: Anxious;WFL for tasks assessed/performed Overall Cognitive Status: Within Functional Limits for tasks assessed                                        Exercises      General Comments        Pertinent Vitals/Pain Pain Assessment: 0-10 Pain Score: 5  Pain Location: R knee Pain Descriptors / Indicators: Sore;Aching Pain Intervention(s): Repositioned;Limited activity within patient's tolerance;Monitored during session;Ice applied;Premedicated before session    [place] expects to be discharged to:: Private residence Living Arrangements: Spouse/significant other Available Help at Discharge: Family;Available 24 hours/day Type of Home: House Home Access: Ramped entrance   Home Layout: One level Home Equipment: Walker - 2 wheels;Wheelchair - manual;Cane - single  point      Prior Function Level of Independence: Independent with assistive device(s)      Comments: used RW and cane   PT Goals (current goals can now be found in the care plan section) Acute Rehab PT Goals PT Goal Formulation: With patient/family Time For Goal Achievement: 05/16/18 Potential to Achieve Goals: Good Progress towards PT goals: Not progressing toward goals - comment(pain, anxiety)    Frequency    7X/week      PT Plan Current plan remains appropriate    Co-evaluation              AM-PAC PT "6 Clicks" Daily Activity  Outcome Measure  Difficulty turning over in bed (including adjusting bedclothes, sheets and blankets)?: Unable Difficulty moving from lying on back to sitting on the side of the bed? : Unable Difficulty sitting down on and standing up from a chair with arms (e.g., wheelchair, bedside commode, etc,.)?: Unable Help needed moving to and from a bed to chair (including a wheelchair)?: Total Help needed walking in hospital room?: Total Help needed climbing 3-5 steps with a railing? : Total 6 Click Score: 6    End of Session Equipment Utilized During Treatment: Gait belt;Right knee immobilizer;Other (comment)(AFO) Activity Tolerance: Patient limited by fatigue;Patient limited by pain Patient left: in bed;with call bell/phone within reach;with family/visitor present Nurse Communication: Mobility status PT Visit Diagnosis: Difficulty in walking, not elsewhere classified (R26.2)     Time: 2992-4268 PT Time Calculation (min) (ACUTE ONLY): 27 min  Charges:  $Gait Training: 8-22 mins $Therapeutic Activity: 8-22 mins                    G Codes:       Carmelia Bake, PT, DPT 05/11/2018 Pager: [phone]  York Ram E 05/11/2018, 3:52 PM

## 2018-05-11 NOTE — Progress Notes (Addendum)
Subjective: 1 Day Post-Op Procedure(s) (LRB): RIGHT TOTAL KNEE ARTHROPLASTY; possible femoral osteotomy (Right) Patient reports pain as mild.   Patient seen in rounds with Dr. Wynelle Link. Patient is well, but has had some minor complaints of foot drop in the right foot. No SOB or chest pain. Foley still in place. No issues overnight other than discomfort in the right knee.  We will start therapy today.  Plan is to go Home after hospital stay.  Objective: Vital signs in last 24 hours: Temp:  [97.3 F (36.3 C)-98.2 F (36.8 C)] 97.5 F (36.4 C) (06/04 0619) Pulse Rate:  [69-95] 80 (06/04 0619) Resp:  [10-28] 16 (06/04 0619) BP: (105-164)/(51-114) 132/65 (06/04 0619) SpO2:  [90 %-100 %] 100 % (06/04 0619) Weight:  [117.9 kg (260 lb)] 117.9 kg (260 lb) (06/03 1020)  Intake/Output from previous day:  Intake/Output Summary (Last 24 hours) at 05/11/2018 0654 Last data filed at 05/11/2018 0600 Gross per 24 hour  Intake 3430 ml  Output 3220 ml  Net 210 ml    Intake/Output this shift: Total I/O In: 1180 [P.O.:420; I.V.:560; IV Piggyback:200] Out: 1870 [Urine:1800; Drains:70]  Labs: Recent Labs    05/11/18 0550  HGB 9.3*   Recent Labs    05/11/18 0550  WBC 10.1  RBC 3.24*  HCT 29.5*  PLT 274   Recent Labs    05/11/18 0550  NA 137  K 4.1  CL 100*  CO2 24  BUN 16  CREATININE 0.59  GLUCOSE 231*  CALCIUM 8.9    EXAM General - Patient is Alert and Oriented Extremity - Sensation intact distally Intact pulses distally Compartment soft  Sensation intact at first web space but unable to dorsiflex Dressing - dressing C/D/I Motor Function - intact, moving foot and toes well on exam.  Hemovac pulled without difficulty.  Past Medical History:  Diagnosis Date  . A-fib [facility])    patient reports she went into AFIB after cataract surgery in July 2018; reports being asymptomatic   . Arthritis    left knee arthritis   . Diabetes mellitus without complication ([place])    type  2   . Dysrhythmia   . Hypertension     Assessment/Plan: 1 Day Post-Op Procedure(s) (LRB): RIGHT TOTAL KNEE ARTHROPLASTY; possible femoral osteotomy (Right) Active Problems:   OA (osteoarthritis) of knee  Estimated body mass index is 47.55 kg/m as calculated from the following:   Height as of this encounter: 5\' 2"  (1.575 m).   Weight as of this encounter: 117.9 kg (260 lb). Advance diet Up with therapy D/C IV fluids when tolerating POs well  DVT Prophylaxis - resume Eliquis Weight-Bearing as tolerated  D/C O2 and Pulse OX and try on Room Air  Anticipated LOS equal to or greater than 2 midnights due to - Age 71 and older with one or more of the following:  - Obesity  - Expected need for hospital services (PT, OT, Nursing) required for safe  discharge  - Anticipated need for postoperative skilled nursing care or inpatient rehab  - Active co-morbidities: Diabetes and Cardiac Arrhythmia OR   - Unanticipated findings during/Post Surgery: right foot drop  - Patient is a high risk of re-admission due to: None   Will monitor right foot for foot drop, which is not surprising given the significant valgus deformity that she had. Saw slight improvement in right foot movement with D/C of knee immobilizer. AFO ordered for right foot. Dr. Wynelle Link discussed decompression with patient should foot drop still  be present at 6 weeks. Will have her up with PT. Probable DC home Wednesday or Thursday with HHPT.   Ardeen Jourdain, PA-C Orthopaedic Surgery 05/11/2018, 6:54 AM

## 2018-05-11 NOTE — Progress Notes (Signed)
Received a call from [place], she will arrange [facility] with [facility], she requested orders be faxed to her at [phone]. She is aware of d/c 6/5. [phone]

## 2018-05-11 NOTE — Evaluation (Signed)
Physical Therapy Evaluation Patient Details Name: Kelly [name] MRN: [id number] DOB: Aug 10, 1946 Today's Date: 05/11/2018   History of Present Illness  71 y.o. female with hx of R distal femur fx with IM nail and had hardware removed 03/2018, R THA 2014 and has had multiple R hip I&D admitted for R TKA (preop significant valgus deformity) and has right foot drop.  Clinical Impression  Pt is s/p TKA resulting in the deficits listed below (see PT Problem List).  Pt will benefit from skilled PT to increase their independence and safety with mobility to allow discharge to the venue listed below.  Pt assisted with donning AFO and ambulated short distance.  AFO did not appear to be a good fit ([employer] returned to room upon request and discussed fit; to bring back AFO this afternoon).  Pt also with shorter leg so KI too long and ortho tech notified to bring shorter KI.  Pt with buckling of R LE with gait however assisted safely to recliner.  OP vs HHPT per presurgical ortho note so recommend HHPT for follow up.     Follow Up Recommendations Follow surgeon's recommendation for DC plan and follow-up therapies    Equipment Recommendations  None recommended by PT    Recommendations for Other Services       Precautions / Restrictions Precautions Precautions: Fall;Knee Required Braces or Orthoses: Knee Immobilizer - Right;Other Brace/Splint Other Brace/Splint: KI too long (notified ortho tech), AFO present however not a good fit with shoe (discussed with [employer]) Restrictions Other Position/Activity Restrictions: WBAT      Mobility  Bed Mobility Overal bed mobility: Needs Assistance Bed Mobility: Supine to Sit     Supine to sit: HOB elevated;Max assist     General bed mobility comments: assist for R LE over EOB as well as scooting to EOB, utilized bed pads  Transfers Overall transfer level: Needs assistance Equipment used: Rolling walker (2 wheeled) Transfers: Sit to/from Stand Sit to  Stand: Min assist;Max assist         General transfer comment: light assist to rise and steady, verbal cues for UE and LE positioning however pt required max assist for descent due to R knee buckling  Ambulation/Gait Ambulation/Gait assistance: Mod assist;+2 physical assistance;+2 safety/equipment Ambulation Distance (Feet): 8 Feet Assistive device: Rolling walker (2 wheeled) Gait Pattern/deviations: Step-to pattern;Decreased stance time - right     General Gait Details: KI too long and pt with AFO (needed to monitor) so did not ambulate with KI however R LE buckled requiring assist, pt assisted to recliner; AFO moving in shoe and pt ambulating with increased supination  Stairs            Wheelchair Mobility    Modified Rankin (Stroke Patients Only)       Balance           Standing balance support: Bilateral upper extremity supported Standing balance-Leahy Scale: Poor Standing balance comment: needs BUE support                             Pertinent Vitals/Pain Pain Assessment: 0-10 Pain Score: 5  Pain Location: R knee Pain Descriptors / Indicators: Sore;Aching Pain Intervention(s): Repositioned;Limited activity within patient's tolerance;Monitored during session;Ice applied;Premedicated before session    [place] expects to be discharged to:: Private residence Living Arrangements: Spouse/significant other Available Help at Discharge: Family;Available 24 hours/day Type of Home: House Home Access: Ramped entrance     Home  Layout: One level Home Equipment: Walker - 2 wheels;Wheelchair - manual;Cane - single point      Prior Function Level of Independence: Independent with assistive device(s)         Comments: used RW and cane     Hand Dominance   Dominant Hand: Left    Extremity/Trunk Assessment        Lower Extremity Assessment Lower Extremity Assessment: Generalized weakness;RLE deficits/detail RLE Deficits /  Details: foot drop, applied AFO for mobility, unable to perform SLR, knee ROM TBA       Communication   Communication: No difficulties  Cognition Arousal/Alertness: Awake/alert Behavior During Therapy: WFL for tasks assessed/performed Overall Cognitive Status: Within Functional Limits for tasks assessed                                        General Comments      Exercises     Assessment/Plan    PT Assessment Patient needs continued PT services  PT Problem List Decreased strength;Decreased range of motion;Decreased mobility;Decreased balance;Decreased knowledge of use of DME;Decreased knowledge of precautions;Pain       PT Treatment Interventions Gait training;DME instruction;Therapeutic exercise;Therapeutic activities;Patient/family education;Functional mobility training;Balance training    PT Goals (Current goals can be found in the Care Plan section)  Acute Rehab PT Goals PT Goal Formulation: With patient/family Time For Goal Achievement: 05/16/18 Potential to Achieve Goals: Good    Frequency 7X/week   Barriers to discharge        Co-evaluation               AM-PAC PT "6 Clicks" Daily Activity  Outcome Measure Difficulty turning over in bed (including adjusting bedclothes, sheets and blankets)?: Unable Difficulty moving from lying on back to sitting on the side of the bed? : Unable Difficulty sitting down on and standing up from a chair with arms (e.g., wheelchair, bedside commode, etc,.)?: Unable Help needed moving to and from a bed to chair (including a wheelchair)?: A Lot Help needed walking in hospital room?: A Lot Help needed climbing 3-5 steps with a railing? : Total 6 Click Score: 8    End of Session Equipment Utilized During Treatment: Gait belt;Other (comment)(AFO) Activity Tolerance: Patient limited by fatigue;Patient limited by pain Patient left: in chair;with call bell/phone within reach;with family/visitor present Nurse  Communication: Mobility status PT Visit Diagnosis: Difficulty in walking, not elsewhere classified (R26.2)    Time: 1125-1200 PT Time Calculation (min) (ACUTE ONLY): 35 min   Charges:   PT Evaluation $PT Eval Moderate Complexity: 1 Mod     PT G CodesCarmelia Bake, PT, DPT 05/11/2018 Pager: [phone]  York Ram E 05/11/2018, 2:05 PM

## 2018-05-11 NOTE — Progress Notes (Signed)
Spoke with patient and spouse at bedside. Confirmed plan for HHPT. Husband provided contact information for CM, Sharlee Blew [phone] ext [phone], Claim# [id number]. Contacted her and left a VM about need for [facility] services. Patient states she has all the DME she needs. Will await callback. Anticipating d/c tomorrow. [phone]

## 2018-05-12 LAB — GLUCOSE, CAPILLARY
GLUCOSE-CAPILLARY: 143 mg/dL — AB (ref 65–99)
GLUCOSE-CAPILLARY: 114 mg/dL — AB (ref 65–99)
Glucose-Capillary: 153 mg/dL — ABNORMAL HIGH (ref 65–99)
Glucose-Capillary: 184 mg/dL — ABNORMAL HIGH (ref 65–99)

## 2018-05-12 LAB — CBC
HEMATOCRIT: 26.9 % — AB (ref 36.0–46.0)
HEMOGLOBIN: 8.5 g/dL — AB (ref 12.0–15.0)
MCH: 28.3 pg (ref 26.0–34.0)
MCHC: 31.6 g/dL (ref 30.0–36.0)
MCV: 89.7 fL (ref 78.0–100.0)
Platelets: 256 10*3/uL (ref 150–400)
RBC: 3 MIL/uL — AB (ref 3.87–5.11)
RDW: 15.1 % (ref 11.5–15.5)
WBC: 11.4 10*3/uL — ABNORMAL HIGH (ref 4.0–10.5)

## 2018-05-12 LAB — BASIC METABOLIC PANEL
ANION GAP: 6 (ref 5–15)
BUN: 25 mg/dL — ABNORMAL HIGH (ref 6–20)
CHLORIDE: 104 mmol/L (ref 101–111)
CO2: 26 mmol/L (ref 22–32)
Calcium: 8.9 mg/dL (ref 8.9–10.3)
Creatinine, Ser: 0.83 mg/dL (ref 0.44–1.00)
GFR calc non Af Amer: >60 mL/min (ref >60)
Glucose, Bld: 176 mg/dL — ABNORMAL HIGH (ref 65–99)
POTASSIUM: 4.6 mmol/L (ref 3.5–5.1)
Sodium: 136 mmol/L (ref 135–145)

## 2018-05-12 MED ORDER — FERROUS SULFATE 325 (65 FE) MG PO TABS
325.0000 mg | ORAL_TABLET | Freq: Three times a day (TID) | ORAL | Status: DC
  Administered 2018-05-12 – 2018-05-17 (×17): 325 mg via ORAL
  Filled 2018-05-12 (×16): qty 1

## 2018-05-12 NOTE — Progress Notes (Signed)
Physical Therapy Treatment Patient Details Name: Kelly [name] MRN: [id number] DOB: 26-Mar-1946 Today's Date: 05/12/2018    History of Present Illness 72 y.o. female with hx of R distal femur fx with IM nail and had hardware removed 03/2018, R THA 2014 and has had multiple R hip I&D admitted for R TKA (preop significant valgus deformity) and has right foot drop.    PT Comments    Pt assisted with stand pivot back to bed and then performed sit to stand once more for technique/strengthing.  Pt then requested back to bed.  Pt assisted with performing LE exercises.  Continue to recommend SNF at this time.   Follow Up Recommendations  SNF     Equipment Recommendations  None recommended by PT    Recommendations for Other Services       Precautions / Restrictions Precautions Precautions: Fall;Knee Required Braces or Orthoses: Knee Immobilizer - Right;Other Brace/Splint Other Brace/Splint: utilized bil KI for stability, AFO Restrictions Other Position/Activity Restrictions: WBAT    Mobility  Bed Mobility Overal bed mobility: Needs Assistance Bed Mobility: Sit to Supine     Supine to sit: Min assist Sit to supine: Mod assist   General bed mobility comments: assist for LEs onto bed  Transfers Overall transfer level: Needs assistance Equipment used: Rolling walker (2 wheeled) Transfers: Sit to/from [employer] Transfers Sit to Stand: Max assist;+2 safety/equipment;+2 physical assistance Stand pivot transfers: Max assist;+2 physical assistance;+2 safety/equipment       General transfer comment: assist to rise and steady, verbal cues for UE and LE positioning, pt required assist to bring weight over feet to standing due to bil KIs, pt with difficulty lifting L LE from floor so pivoted using cues for UEs on RW and moving R LE, +3 for safety, performed sit to stand once more from bed for technique/strengthening  Ambulation/Gait                 Stairs              Wheelchair Mobility    Modified Rankin (Stroke Patients Only)       Balance                                            Cognition Arousal/Alertness: Awake/alert Behavior During Therapy: Anxious;WFL for tasks assessed/performed Overall Cognitive Status: Within Functional Limits for tasks assessed                                        Exercises Total Joint Exercises Ankle Circles/Pumps: AROM;10 reps;Supine;PROM(son assisted with PROM R ankle) Quad Sets: AROM;Right;Supine;10 reps Towel Squeeze: AROM;10 reps;Both Short Arc Quad: AROM;10 reps;Supine;AAROM;Both(R AAROM) Heel Slides: AAROM;Right;10 reps;Supine Hip ABduction/ADduction: AROM;AAROM;10 reps;Both Goniometric ROM: approx 45* AAROM R knee flexion    General Comments        Pertinent Vitals/Pain Pain Assessment: 0-10 Pain Score: 3  Pain Location: R knee Pain Descriptors / Indicators: Sore;Aching Pain Intervention(s): Limited activity within patient's tolerance;Repositioned;Monitored during session;Ice applied    Home Living                      Prior Function            PT Goals (current goals can now be found in the care plan  section) Progress towards PT goals: Progressing toward goals(attempting to progress, limited by weakness)    Frequency    7X/week      PT Plan Current plan remains appropriate    Co-evaluation              AM-PAC PT "6 Clicks" Daily Activity  Outcome Measure  Difficulty turning over in bed (including adjusting bedclothes, sheets and blankets)?: Unable Difficulty moving from lying on back to sitting on the side of the bed? : Unable Difficulty sitting down on and standing up from a chair with arms (e.g., wheelchair, bedside commode, etc,.)?: Unable Help needed moving to and from a bed to chair (including a wheelchair)?: Total Help needed walking in hospital room?: Total Help needed climbing 3-5 steps with a railing? :  Total 6 Click Score: 6    End of Session Equipment Utilized During Treatment: Gait belt;Right knee immobilizer;Other (comment) Activity Tolerance: Patient limited by fatigue;Patient limited by pain Patient left: in bed;with call bell/phone within reach;with family/visitor present;with bed alarm set Nurse Communication: Mobility status PT Visit Diagnosis: Difficulty in walking, not elsewhere classified (R26.2)     Time: 1519-1600 PT Time Calculation (min) (ACUTE ONLY): 41 min  Charges:  $Therapeutic Exercise: 8-22 mins $Therapeutic Activity: 23-37 mins                    G Codes:      Carmelia Bake, PT, DPT 05/12/2018 Pager: [phone]  York Ram E 05/12/2018, 4:13 PM

## 2018-05-12 NOTE — Progress Notes (Signed)
Physical Therapy Treatment Patient Details Name: Kelly [name] MRN: [id number] DOB: September 30, 1946 Today's Date: 05/12/2018    History of Present Illness 72 y.o. female with hx of R distal femur fx with IM nail and had hardware removed 03/2018, R THA 2014 and has had multiple R hip I&D admitted for R TKA (preop significant valgus deformity) and has right foot drop.    PT Comments    Pt requiring +2 to 3 assist at this time for safety with mobility.  Pt's L LE buckling today so requested KI from ortho tech for L LE for this afternoon (assisting back to bed).  Son reports pt was only transferring from recliner chair to [facility] since April after recent surgery.  Pt is significantly weak and also appears to have some anxiety (fearful of falling).  Recommend SNF upon d/c at this time as pt is HIGH fall risk and unable to mobilize currently without extensive assist.    Follow Up Recommendations  SNF(recommending SNF at this time, RN notified)     Equipment Recommendations  None recommended by PT    Recommendations for Other Services       Precautions / Restrictions Precautions Precautions: Fall;Knee Required Braces or Orthoses: Knee Immobilizer - Right;Other Brace/Splint Other Brace/Splint: shorter KI placed, AFO  Restrictions Other Position/Activity Restrictions: WBAT    Mobility  Bed Mobility Overal bed mobility: Needs Assistance Bed Mobility: Supine to Sit     Supine to sit: Min assist     General bed mobility comments: assist for R LE, pt able to scoot out to edge of bed with time and effort, utilized elevated HOB and bed rails to self assist  Transfers Overall transfer level: Needs assistance Equipment used: Rolling walker (2 wheeled) Transfers: Sit to/from Stand Sit to Stand: Max assist;+2 safety/equipment;+2 physical assistance         General transfer comment: assist to rise and steady, verbal cues for UE and LE positioning, pt unable to bring weight forward/ complete  COG without assist, pt with difficulty taking steps requiring max assist despite provided cues; pt's left leg was buckling so recliner placed behind pt to prevent fall  Ambulation/Gait                 Stairs             Wheelchair Mobility    Modified Rankin (Stroke Patients Only)       Balance                                            Cognition Arousal/Alertness: Awake/alert Behavior During Therapy: Anxious;WFL for tasks assessed/performed Overall Cognitive Status: Within Functional Limits for tasks assessed                                        Exercises      General Comments        Pertinent Vitals/Pain Pain Assessment: 0-10 Pain Score: 4  Pain Location: R knee Pain Descriptors / Indicators: Sore;Aching Pain Intervention(s): Limited activity within patient's tolerance;Repositioned;Monitored during session;Premedicated before session;Ice applied    Home Living                      Prior Function  PT Goals (current goals can now be found in the care plan section) Progress towards PT goals: Not progressing toward goals - comment(L LE buckling, weakness, anxiety preventing mobility progress)    Frequency    7X/week      PT Plan Discharge plan needs to be updated    Co-evaluation              AM-PAC PT "6 Clicks" Daily Activity  Outcome Measure  Difficulty turning over in bed (including adjusting bedclothes, sheets and blankets)?: Unable Difficulty moving from lying on back to sitting on the side of the bed? : Unable Difficulty sitting down on and standing up from a chair with arms (e.g., wheelchair, bedside commode, etc,.)?: Unable Help needed moving to and from a bed to chair (including a wheelchair)?: Total Help needed walking in hospital room?: Total Help needed climbing 3-5 steps with a railing? : Total 6 Click Score: 6    End of Session Equipment Utilized During  Treatment: Gait belt;Right knee immobilizer;Other (comment) Activity Tolerance: Patient limited by fatigue;Patient limited by pain Patient left: with call bell/phone within reach;with family/visitor present;in chair Nurse Communication: Mobility status PT Visit Diagnosis: Difficulty in walking, not elsewhere classified (R26.2)     Time: 0626-9485 PT Time Calculation (min) (ACUTE ONLY): 24 min  Charges:  $Therapeutic Activity: 23-37 mins                    G Codes:       Carmelia Bake, PT, DPT 05/12/2018 Pager: [phone]  York Ram E 05/12/2018, 1:10 PM

## 2018-05-12 NOTE — Progress Notes (Signed)
   Subjective: 2 Days Post-Op Procedure(s) (LRB): RIGHT TOTAL KNEE ARTHROPLASTY; femoral osteotomy (Right) Patient reports pain as moderate.   Patient seen in rounds with Dr. Wynelle Link. Patient is well, right foot drop still present. AFO brace ordered yesterday for patient, and CPM discontinued to avoid nerve irritation. Will continue to work with therapy today.  Plan is to go Home after hospital stay.  Objective: Vital signs in last 24 hours: Temp:  [97.6 F (36.4 C)-98.1 F (36.7 C)] 97.6 F (36.4 C) (06/05 0636) Pulse Rate:  [84-91] 87 (06/05 0636) Resp:  [14-20] 18 (06/05 0636) BP: (123-157)/(52-80) 123/52 (06/05 0636) SpO2:  [92 %-100 %] 95 % (06/05 0636)  Intake/Output from previous day:  Intake/Output Summary (Last 24 hours) at 05/12/2018 0735 Last data filed at 05/12/2018 5945 Gross per 24 hour  Intake 1927.5 ml  Output 1875 ml  Net 52.5 ml    Labs: Recent Labs    05/11/18 0550 05/12/18 0533  HGB 9.3* 8.5*   Recent Labs    05/11/18 0550 05/12/18 0533  WBC 10.1 11.4*  RBC 3.24* 3.00*  HCT 29.5* 26.9*  PLT 274 256   Recent Labs    05/11/18 0550 05/12/18 0533  NA 137 136  K 4.1 4.6  CL 100* 104  CO2 24 26  BUN 16 25*  CREATININE 0.59 0.83  GLUCOSE 231* 176*  CALCIUM 8.9 8.9   EXAM General - Patient is Alert and Oriented Extremity - Neurologically intact Neurovascular intact Sensation intact distally Intact pulses distally  Dressing/Incision - clean, dry, no drainage Motor Function - intact, moving foot and toes well on exam.  Past Medical History:  Diagnosis Date  . A-fib [facility])    patient reports she went into AFIB after cataract surgery in July 2018; reports being asymptomatic   . Arthritis    left knee arthritis   . Diabetes mellitus without complication ([place])    type 2   . Dysrhythmia   . Hypertension     Assessment/Plan: 2 Days Post-Op Procedure(s) (LRB): RIGHT TOTAL KNEE ARTHROPLASTY; femoral osteotomy (Right) Active Problems:  OA (osteoarthritis) of knee  Estimated body mass index is 47.55 kg/m as calculated from the following:   Height as of this encounter: 5\' 2"  (1.575 m).   Weight as of this encounter: 117.9 kg (260 lb). Up with therapy  DVT Prophylaxis - TED hose and Eliquis Weight-Bearing as tolerated  HHPT arranged for patient upon discharge once stable and meeting therapy goals. Will continue to monitor.   Theresa Duty, PA-C Orthopaedic Surgery 05/12/2018, 7:35 AM

## 2018-05-13 LAB — CBC
HEMATOCRIT: 24 % — AB (ref 36.0–46.0)
HEMOGLOBIN: 7.5 g/dL — AB (ref 12.0–15.0)
MCH: 28 pg (ref 26.0–34.0)
MCHC: 31.3 g/dL (ref 30.0–36.0)
MCV: 89.6 fL (ref 78.0–100.0)
Platelets: 235 10*3/uL (ref 150–400)
RBC: 2.68 MIL/uL — AB (ref 3.87–5.11)
RDW: 15.3 % (ref 11.5–15.5)
WBC: 10.8 10*3/uL — AB (ref 4.0–10.5)

## 2018-05-13 LAB — GLUCOSE, CAPILLARY
GLUCOSE-CAPILLARY: 172 mg/dL — AB (ref 65–99)
Glucose-Capillary: 148 mg/dL — ABNORMAL HIGH (ref 65–99)
Glucose-Capillary: 141 mg/dL — ABNORMAL HIGH (ref 65–99)

## 2018-05-13 LAB — HEMOGLOBIN AND HEMATOCRIT, BLOOD
HEMATOCRIT: 29.2 % — AB (ref 36.0–46.0)
HEMOGLOBIN: 9.5 g/dL — AB (ref 12.0–15.0)

## 2018-05-13 LAB — PREPARE RBC (CROSSMATCH)

## 2018-05-13 MED ORDER — SODIUM CHLORIDE 0.9 % IV SOLN: Freq: Once | INTRAVENOUS | Status: DC

## 2018-05-13 MED ORDER — TRAMADOL HCL 50 MG PO TABS
50.0000 mg | ORAL_TABLET | Freq: Four times a day (QID) | ORAL | Status: DC | PRN
  Administered 2018-05-13 (×2): 50 mg via ORAL
  Administered 2018-05-14: 100 mg via ORAL
  Administered 2018-05-14: 50 mg via ORAL
  Administered 2018-05-15: 100 mg via ORAL
  Filled 2018-05-13 (×2): qty 1
  Filled 2018-05-13: qty 2
  Filled 2018-05-13: qty 1
  Filled 2018-05-13: qty 2

## 2018-05-13 NOTE — Clinical Social Work Note (Addendum)
Clinical Social Work Assessment  Patient Details  Name: Kelly [name] MRN: [id number] Date of Birth: 10-Oct-1946  Date of referral:  05/13/18               Reason for consult:  Facility Placement                Permission sought to share information with:  Facility Sport and exercise psychologist, Family Supports, Case Manager Permission granted to share information::  Yes, Verbal Permission Granted  Name::        Agency::   SNF  Relationship::   Son-Jonathan  Allison Quarry Information:   [phone] / [phone] Case manager w/ workers comp.  Sharlee Blew [phone], Claim# [id number].  Housing/Transportation Living arrangements for the past 2 months:  [place] of Information:  Patient Patient Interpreter Needed:  None Criminal Activity/Legal Involvement Pertinent to Current Situation/Hospitalization:  No - Comment as needed Significant Relationships:  Spouse, Adult Children Lives with:  Self Do you feel safe going back to the place where you live?  Yes Need for family participation in patient care:  Yes (Comment)  Care giving concerns:   Planned right total knee arthroplasty.  Social Worker assessment / plan:  CSW met with the patient and her sons at bedside, explain role and reason for visit to assist with discharge to SNF. Patient alert and oriented x4 reports she has been progressing slowly with rehab and feels she can benefit from rehab at a facility. Patient fairly independent prior to surgery.   CSW explain SNF process and provided bed offers. CSW explain the facility will have to negotiate payment with the workers comp.  case manager for placement.  Patient son inquired about [facility], csw informed him the facility does not have beds at this time. Patient son inquired if they are allowed to pay the difference that workers comp. Will not pay. CSW informed son that will be specifically up to the facility.  CSW will follow up with  the patient son for bed choice.    Patient will need FL2/PASRR.   Plan: SNF  Employment status:    Insurance information:  Medicare PT Recommendations:  [place] / Referral to community resources:  [place]  Patient/Family's Response to care:  Agreeable and Responding well to care.   Patient/Family's Understanding of and Emotional Response to Diagnosis, Current Treatment, and Prognosis: Patient has a good understanding of her diagnosis. She refers to her sons to answer questions and assist with discharge planning.   Emotional Assessment Appearance:  Appears stated age Attitude/Demeanor/Rapport:    Affect (typically observed):  Accepting, Calm Orientation:  Oriented to Self, Oriented to Place, Oriented to  Time, Oriented to Situation Alcohol / Substance use:  Not Applicable Psych involvement (Current and /or in the community):  No (Comment)  Discharge Needs  Concerns to be addressed:  Discharge Planning Concerns Readmission within the last 30 days:  Yes Current discharge risk:  Dependent with Mobility Barriers to Discharge:  [place], [place] 05/13/2018, 11:06 AM

## 2018-05-13 NOTE — Progress Notes (Signed)
Provided SNF bed offers to pt's husband and son. Worker's comp case worker Caleb Popp confirms facilities contracted with [facility] are [place]. Son states of these offers [place] is preference, however, family hoping that [facility] is able to accept pt. CSW spoke with [facility] admissions-  states facility is reviewing pt's information and investigating whether a single case agreement for worker's comp coverage could be approved. Anticipates bed availability Monday 05/17/18.  Son states if [facility] unable to offer or does not have bed availability, they are agreeable to [place].  Sharren Bridge, MSW, LCSW Clinical Social Work 05/13/2018 [phone] coverage for [phone]

## 2018-05-13 NOTE — Clinical Social Work Placement (Signed)
   CLINICAL SOCIAL WORK PLACEMENT  NOTE  Date:  05/13/2018  Patient Details  Name: Kelly [name] MRN: [id number] Date of Birth: 08/16/1946  Clinical Social Work is seeking post-discharge placement for this patient at the [place] level of care (*CSW will initial, date and re-position this form in  chart as items are completed):  Yes   Patient/family provided with [facility] Work Department's list of facilities offering this level of care within the geographic area requested by the patient (or if unable, by the patient's family).  Yes   Patient/family informed of their freedom to choose among providers that offer the needed level of care, that participate in Medicare, Medicaid or managed care program needed by the patient, have an available bed and are willing to accept the patient.  Yes   Patient/family informed of [facility]'s ownership interest in [facility] and [facility], as well as of the fact that they are under no obligation to receive care at these facilities.  PASRR submitted to EDS on       PASRR number received on       Existing PASRR number confirmed on 05/13/18     FL2 transmitted to all facilities in geographic area requested by pt/family on 05/13/18     FL2 transmitted to all facilities within larger geographic area on 05/13/18     Patient informed that his/her managed care company has contracts with or will negotiate with certain facilities, including the following:            Patient/family informed of bed offers received.  Patient chooses bed at       Physician recommends and patient chooses bed at      Patient to be transferred to   on  .  Patient to be transferred to facility by       Patient family notified on   of transfer.  Name of family member notified:        PHYSICIAN       Additional Comment:    _______________________________________________ Lia Hopping, LCSW 05/13/2018, 11:42 AM

## 2018-05-13 NOTE — Progress Notes (Signed)
Physical Therapy Treatment Patient Details Name: Kelly [name] MRN: [id number] DOB: 12/26/1945 Today's Date: 05/13/2018    History of Present Illness 72 y.o. female with hx of R distal femur fx with IM nail and had hardware removed 03/2018, R THA 2014 and has had multiple R hip I&D admitted for R TKA (preop significant valgus deformity) and has right foot drop.    PT Comments    Pt with issues receiving blood this morning however currently receiving first unit of PRBCs this afternoon so only performed bed exercises.  Pt assisted with BIL LE exercises.  Plan is now for d/c to SNF.  Follow Up Recommendations  SNF     Equipment Recommendations  None recommended by PT    Recommendations for Other Services       Precautions / Restrictions Precautions Precautions: Fall;Knee Required Braces or Orthoses: Knee Immobilizer - Right;Other Brace/Splint Other Brace/Splint: utilized bil KI for stability, AFO Restrictions Other Position/Activity Restrictions: WBAT    Mobility  Bed Mobility                  Transfers                    Ambulation/Gait                 Stairs             Wheelchair Mobility    Modified Rankin (Stroke Patients Only)       Balance                                            Cognition Arousal/Alertness: Awake/alert Behavior During Therapy: Anxious;WFL for tasks assessed/performed Overall Cognitive Status: Within Functional Limits for tasks assessed                                        Exercises Total Joint Exercises Ankle Circles/Pumps: AROM;10 reps;Supine;PROM(son assisted with PROM R ankle) Quad Sets: AROM;Right;Supine;10 reps Towel Squeeze: AROM;10 reps;Both Short Arc Quad: AROM;10 reps;Supine;AAROM;Both(R AAROM) Heel Slides: AAROM;10 reps;Supine;Both Hip ABduction/ADduction: AROM;AAROM;10 reps;Both    General Comments        Pertinent Vitals/Pain Pain Assessment:  0-10 Pain Score: 5  Pain Location: R knee Pain Descriptors / Indicators: Sore;Aching Pain Intervention(s): Limited activity within patient's tolerance;Repositioned;Monitored during session;Premedicated before session    Home Living                      Prior Function            PT Goals (current goals can now be found in the care plan section) Progress towards PT goals: Progressing toward goals    Frequency    7X/week      PT Plan Current plan remains appropriate    Co-evaluation              AM-PAC PT "6 Clicks" Daily Activity  Outcome Measure  Difficulty turning over in bed (including adjusting bedclothes, sheets and blankets)?: Unable Difficulty moving from lying on back to sitting on the side of the bed? : Unable Difficulty sitting down on and standing up from a chair with arms (e.g., wheelchair, bedside commode, etc,.)?: Unable Help needed moving to and from a bed to chair (including a wheelchair)?: Total  Help needed walking in hospital room?: Total Help needed climbing 3-5 steps with a railing? : Total 6 Click Score: 6    End of Session   Activity Tolerance: Other (comment)(getting PRBCs) Patient left: in bed;with call bell/phone within reach;with family/visitor present;with bed alarm set   PT Visit Diagnosis: Difficulty in walking, not elsewhere classified (R26.2)     Time: 5672-0919 PT Time Calculation (min) (ACUTE ONLY): 19 min  Charges:  $Therapeutic Exercise: 8-22 mins                    G Codes:       Carmelia Bake, PT, DPT 05/13/2018 Pager: [phone]  [place] Ram E 05/13/2018, 4:17 PM

## 2018-05-13 NOTE — NC FL2 (Addendum)
[place] LEVEL OF CARE SCREENING TOOL     IDENTIFICATION  Patient Name: Kelly [name] Birthdate: Jul 05, 1946 Sex: female Admission Date (Current Location): 05/10/2018  [facility] and [place] Number:  Herbalist and Address:  [facility],  [place] [address], [place]      Provider Number: [id number]  Attending Physician Name and Address:  Gaynelle Arabian, MD  Relative Name and Phone Number:       Current Level of Care: Hospital Recommended Level of Care: [place] Prior Approval Number:    Date Approved/Denied:   PASRR Number:   [id number]   Discharge Plan: SNF    Current Diagnoses: Patient Active Problem List   Diagnosis Date Noted  . A-fib ([place])   . OA (osteoarthritis) of knee 03/24/2018  . Closed fracture of right distal femur ([place]) 01/16/2015  . Fracture of distal femur ([place]) 01/16/2015  . Local infection of skin and subcutaneous tissue 08/15/2013  . OA (osteoarthritis) of hip 07/07/2013    Orientation RESPIRATION BLADDER Height & Weight     Self, Time, Situation, Place  Normal Continent Weight: 260 lb (117.9 kg) Height:  5\' 2"  (157.5 cm)  BEHAVIORAL SYMPTOMS/MOOD NEUROLOGICAL BOWEL NUTRITION STATUS      Continent Diet  AMBULATORY STATUS COMMUNICATION OF NEEDS Skin   Extensive Assist Verbally Surgical wounds                       Personal Care Assistance Level of Assistance  Bathing, Feeding, Dressing Bathing Assistance: Limited assistance Feeding assistance: Independent Dressing Assistance: Limited assistance     Functional Limitations Info  Sight, Hearing, Speech Sight Info: Adequate Hearing Info: Adequate Speech Info: Adequate    SPECIAL CARE FACTORS FREQUENCY  PT (By licensed PT), OT (By licensed OT)     PT Frequency: 7x/week OT Frequency: 7x/week            Contractures Contractures Info: Not present    Additional Factors Info  Code Status, Allergies Code Status  Info: fullcode Allergies Info: Allergies: Tape, Robaxin Methocarbamol           Current Medications (05/13/2018):  This is the current hospital active medication list Current Facility-Administered Medications  Medication Dose Route Frequency Provider Last Rate Last Dose  . 0.9 %  sodium chloride infusion   Intravenous Continuous Gaynelle Arabian, MD   Stopped at 05/11/18 1530  . 0.9 %  sodium chloride infusion   Intravenous Once Edmisten, Kristie L, PA      . acetaminophen (TYLENOL) tablet 325-650 mg  325-650 mg Oral Q6H PRN Aluisio, Pilar Plate, MD      . apixaban Arne [place]) tablet 2.5 mg  2.5 mg Oral Q12H Gaynelle Arabian, MD   2.5 mg at 05/13/18 0803  . bisacodyl (DULCOLAX) suppository 10 mg  10 mg Rectal Daily PRN Gaynelle Arabian, MD      . cyclobenzaprine (FLEXERIL) tablet 10 mg  10 mg Oral TID PRN Gaynelle Arabian, MD   10 mg at 05/13/18 0803  . diphenhydrAMINE (BENADRYL) 12.5 MG/5ML elixir 12.5-25 mg  12.5-25 mg Oral Q4H PRN Aluisio, Pilar Plate, MD      . docusate sodium (COLACE) capsule 100 mg  100 mg Oral BID Gaynelle Arabian, MD   100 mg at 05/12/18 2148  . ferrous sulfate tablet 325 mg  325 mg Oral TID WC Gaynelle Arabian, MD   325 mg at 05/13/18 0803  . gabapentin (NEURONTIN) capsule 300 mg  300 mg Oral  TID Gaynelle Arabian, MD   300 mg at 05/12/18 2148  . HYDROmorphone (DILAUDID) injection 0.5-1 mg  0.5-1 mg Intravenous Q4H PRN Gaynelle Arabian, MD   1 mg at 05/11/18 0413  . HYDROmorphone (DILAUDID) tablet 2 mg  2 mg Oral Q4H PRN Gaynelle Arabian, MD   2 mg at 05/13/18 0803  . HYDROmorphone (DILAUDID) tablet 4-6 mg  4-6 mg Oral Q4H PRN Gaynelle Arabian, MD   6 mg at 05/12/18 0858  . insulin aspart (novoLOG) injection 0-15 Units  0-15 Units Subcutaneous TID WC Edmisten, Kristie L, PA   2 Units at 05/13/18 0804  . menthol-cetylpyridinium (CEPACOL) lozenge 3 mg  1 lozenge Oral PRN Gaynelle Arabian, MD       Or  . phenol (CHLORASEPTIC) mouth spray 1 spray  1 spray Mouth/Throat PRN Aluisio, Pilar Plate, MD      .  metoCLOPramide (REGLAN) tablet 5-10 mg  5-10 mg Oral Q8H PRN Aluisio, Pilar Plate, MD       Or  . metoCLOPramide (REGLAN) injection 5-10 mg  5-10 mg Intravenous Q8H PRN Gaynelle Arabian, MD   10 mg at 05/11/18 2020  . ondansetron (ZOFRAN) tablet 4 mg  4 mg Oral Q6H PRN Gaynelle Arabian, MD   4 mg at 05/11/18 0448   Or  . ondansetron (ZOFRAN) injection 4 mg  4 mg Intravenous Q6H PRN Gaynelle Arabian, MD   4 mg at 05/10/18 2121  . polyethylene glycol (MIRALAX / GLYCOLAX) packet 17 g  17 g Oral Daily PRN Aluisio, Pilar Plate, MD      . ramipril (ALTACE) capsule 20 mg  20 mg Oral QPM Gaynelle Arabian, MD   20 mg at 05/12/18 1754  . simvastatin (ZOCOR) tablet 40 mg  40 mg Oral QPM Gaynelle Arabian, MD   40 mg at 05/12/18 1754  . sodium phosphate (FLEET) 7-19 GM/118ML enema 1 enema  1 enema Rectal Once PRN Aluisio, Pilar Plate, MD      . traMADol Veatrice [place]bon) tablet 50-100 mg  50-100 mg Oral Q6H PRN Gaynelle Arabian, MD         Discharge Medications: Please see discharge summary for a list of discharge medications.  Relevant Imaging Results:  Relevant Lab Results:   Additional Information YBW:[id number]  Lia Hopping, LCSW

## 2018-05-13 NOTE — Progress Notes (Addendum)
   Subjective: 3 Days Post-Op Procedure(s) (LRB): RIGHT TOTAL KNEE ARTHROPLASTY; femoral osteotomy (Right) Patient reports pain as moderate.   Patient seen in rounds with Dr. Wynelle Link. Patient is well, right foot drop still present. Wearing AFO brace. Still working on transfers with physical therapy, has not yet ambulated.  Discussed probable discharge to SNF upon leaving hospital to continue therapy, patient is agreeable. Voiding without difficulty, denies chest pain, SOB, or calf pain. Plan is to go Skilled nursing facility after hospital stay.  Objective: Vital signs in last 24 hours: Temp:  [97.5 F (36.4 C)-98.6 F (37 C)] 98.6 F (37 C) (06/06 0600) Pulse Rate:  [79-93] 93 (06/06 0600) Resp:  [15-17] 17 (06/06 0600) BP: (101-168)/(50-76) 101/50 (06/06 0600) SpO2:  [93 %-100 %] 98 % (06/06 0600)  Intake/Output from previous day:  Intake/Output Summary (Last 24 hours) at 05/13/2018 0758 Last data filed at 05/13/2018 0600 Gross per 24 hour  Intake 600 ml  Output 2245 ml  Net -1645 ml    Labs: Recent Labs    05/11/18 0550 05/12/18 0533 05/13/18 0546  HGB 9.3* 8.5* 7.5*   Recent Labs    05/12/18 0533 05/13/18 0546  WBC 11.4* 10.8*  RBC 3.00* 2.68*  HCT 26.9* 24.0*  PLT 256 235   Recent Labs    05/11/18 0550 05/12/18 0533  NA 137 136  K 4.1 4.6  CL 100* 104  CO2 24 26  BUN 16 25*  CREATININE 0.59 0.83  GLUCOSE 231* 176*  CALCIUM 8.9 8.9   EXAM General - Patient is Alert and Oriented Extremity - Neurologically intact Neurovascular intact Sensation intact distally Unable to dorsiflex right ankle. Dressing/Incision - clean, dry, no drainage Motor Function - intact, moving foot and toes well on exam.  Past Medical History:  Diagnosis Date  . A-fib [facility])    patient reports she went into AFIB after cataract surgery in July 2018; reports being asymptomatic   . Arthritis    left knee arthritis   . Diabetes mellitus without complication ([place])    type 2     . Dysrhythmia   . Hypertension     Assessment/Plan: 3 Days Post-Op Procedure(s) (LRB): RIGHT TOTAL KNEE ARTHROPLASTY; femoral osteotomy (Right) Active Problems:   OA (osteoarthritis) of knee  Estimated body mass index is 47.55 kg/m as calculated from the following:   Height as of this encounter: 5\' 2"  (1.575 m).   Weight as of this encounter: 117.9 kg (260 lb). Up with therapy  DVT Prophylaxis - Eliquis Weight-Bearing as tolerated  Hemoglobin down to 7.5 this AM, discussed need for transfusion and pt is agreeable. 2 units of RBC ordered with post-transfusion H&H. CBC ordered for tomorrow AM.  Will continue to work with therapy today and see how she progresses. Plan for now is discharge to SNF to due to need to extensive assistance with ambulation.   Theresa Duty, PA-C Orthopaedic Surgery 05/13/2018, 7:58 AM

## 2018-05-13 NOTE — Plan of Care (Signed)
  Problem: Activity: Goal: Risk for activity intolerance will decrease Outcome: Progressing   

## 2018-05-13 NOTE — Progress Notes (Addendum)
Saline lock was flushed, vitals taken and then went to get the blood, started blood at 0915 and noticed IV was leaking and stopped blood at 0925. Attempted to start IV, consulted IV team and IV was started, vitals taken at 1017, blood stated at 1021 and IV infiltrated at 1037 and so IV was stopped and IV team was called and consulted again. IV team again restarted IV and was established then vitals were taken at 1150, and blood was started 1152 and the infusion was stopped  at 1315 as it was 4 hours and vitals were taken at 1330 .  I tried to have blood cosigned again by second nurse but unable to. Jacksen Isip ClarkRN and Amy Abernathy Rn checked the blood.  Pa was made aware of patient not receiving the whole unit and will decide if another unit is appropriate after H&H.

## 2018-05-14 LAB — BPAM RBC
Blood Product Expiration Date: 201907012359
Blood Product Expiration Date: 201907012359
ISSUE DATE / TIME: 201906060913
ISSUE DATE / TIME: 201906061344
Unit Type and Rh: 5100
Unit Type and Rh: 5100

## 2018-05-14 LAB — TYPE AND SCREEN
ABO/RH(D): O POS
Antibody Screen: NEGATIVE
Unit division: 0
Unit division: 0

## 2018-05-14 LAB — CBC
HEMATOCRIT: 30.1 % — AB (ref 36.0–46.0)
HEMOGLOBIN: 9.5 g/dL — AB (ref 12.0–15.0)
MCH: 28.5 pg (ref 26.0–34.0)
MCHC: 31.6 g/dL (ref 30.0–36.0)
MCV: 90.4 fL (ref 78.0–100.0)
PLATELETS: 239 10*3/uL (ref 150–400)
RBC: 3.33 MIL/uL — AB (ref 3.87–5.11)
RDW: 15.9 % — ABNORMAL HIGH (ref 11.5–15.5)
WBC: 9.7 10*3/uL (ref 4.0–10.5)

## 2018-05-14 LAB — GLUCOSE, CAPILLARY
GLUCOSE-CAPILLARY: 158 mg/dL — AB (ref 65–99)
Glucose-Capillary: 182 mg/dL — ABNORMAL HIGH (ref 65–99)
Glucose-Capillary: 127 mg/dL — ABNORMAL HIGH (ref 65–99)
Glucose-Capillary: 155 mg/dL — ABNORMAL HIGH (ref 65–99)
Glucose-Capillary: 190 mg/dL — ABNORMAL HIGH (ref 65–99)

## 2018-05-14 NOTE — Progress Notes (Signed)
Physical Therapy Treatment Patient Details Name: Kelly [name] MRN: [id number] DOB: 10/10/1946 Today's Date: 05/14/2018    History of Present Illness 72 y.o. female with hx of R distal femur fx with IM nail and had hardware removed 03/2018, R THA 2014 and has had multiple R hip I&D admitted for R TKA (preop significant valgus deformity) and has right foot drop.    PT Comments    Pt assisted with sit to stands and then performed stand pivot over to recliner.  Pt appears more confident today and eager to mobilize.  Pt assisted with bil LE exercises. Son reports plan is for [facility] Monday.  Follow Up Recommendations  SNF     Equipment Recommendations  None recommended by PT    Recommendations for Other Services       Precautions / Restrictions Precautions Precautions: Fall;Knee Required Braces or Orthoses: Knee Immobilizer - Right;Other Brace/Splint Other Brace/Splint: utilized bil KI for stability, AFO Restrictions Other Position/Activity Restrictions: WBAT    Mobility  Bed Mobility Overal bed mobility: Needs Assistance Bed Mobility: Supine to Sit     Supine to sit: Min assist     General bed mobility comments: slight assist for LEs as shoes donned in bed, increased time and effort  Transfers Overall transfer level: Needs assistance Equipment used: Rolling walker (2 wheeled) Transfers: Sit to/from [facility] Sit to Stand: Max assist;+2 physical assistance;+2 safety/equipment Stand pivot transfers: Mod assist;+2 physical assistance;+2 safety/equipment       General transfer comment: multimodal cues for technique, assist to rise, steady and control descent, pt attempting to assist more today and following cues has improved, performed sit to stand twice for technique/strengthening and performed weight shifting in standing, pt then performed stand pivot to recliner  Ambulation/Gait                 Stairs             Wheelchair Mobility     Modified Rankin (Stroke Patients Only)       Balance                                            Cognition Arousal/Alertness: Awake/alert Behavior During Therapy: WFL for tasks assessed/performed Overall Cognitive Status: Within Functional Limits for tasks assessed                                        Exercises Total Joint Exercises Ankle Circles/Pumps: AROM;10 reps;Supine;PROM Quad Sets: AROM;Right;Supine;10 reps Towel Squeeze: AROM;10 reps;Both Short Arc Quad: 10 reps;Supine;AAROM;Both Heel Slides: AAROM;10 reps;Supine;Both Hip ABduction/ADduction: AROM;AAROM;10 reps;Both    General Comments        Pertinent Vitals/Pain Pain Assessment: 0-10 Pain Score: 4  Pain Location: R knee Pain Descriptors / Indicators: Sore;Aching Pain Intervention(s): Repositioned;Monitored during session;Limited activity within patient's tolerance;Ice applied;Premedicated before session    Home Living                      Prior Function            PT Goals (current goals can now be found in the care plan section) Progress towards PT goals: Progressing toward goals    Frequency    7X/week      PT Plan Current plan remains appropriate  Co-evaluation              AM-PAC PT "6 Clicks" Daily Activity  Outcome Measure  Difficulty turning over in bed (including adjusting bedclothes, sheets and blankets)?: Unable Difficulty moving from lying on back to sitting on the side of the bed? : Unable Difficulty sitting down on and standing up from a chair with arms (e.g., wheelchair, bedside commode, etc,.)?: Unable Help needed moving to and from a bed to chair (including a wheelchair)?: A Lot Help needed walking in hospital room?: Total Help needed climbing 3-5 steps with a railing? : Total 6 Click Score: 7    End of Session Equipment Utilized During Treatment: Gait belt;Right knee immobilizer;Other (comment) Activity Tolerance:  Patient tolerated treatment well Patient left: with call bell/phone within reach;with family/visitor present;in chair Nurse Communication: Mobility status PT Visit Diagnosis: Difficulty in walking, not elsewhere classified (R26.2)     Time: 2725-3664 PT Time Calculation (min) (ACUTE ONLY): 33 min  Charges:  $Therapeutic Exercise: 8-22 mins $Therapeutic Activity: 8-22 mins                    G Codes:       Kelly [name], PT, DPT 05/14/2018 Pager: [phone]  York Ram E 05/14/2018, 1:30 PM

## 2018-05-14 NOTE — Progress Notes (Signed)
CSW received call from [facility] at [facility], they are unable to extend bed offer for placement due to no bed availability on Monday. She is unsure when they will have another bed available. She reports also due to patient expensive injection medication and inability to purchase a generic brand they cannot offer.   CSW informed barriers to the  patient, son and spouse at bedside.   Patient, son, spouse agreeable to [place].  CSW provided the liaison with workers comp Consulting civil engineer.   CSW will continue to follow for d/c needs.  Kathrin Greathouse, Latanya Presser, MSW Clinical Social Worker  [phone] 05/14/2018  10:05 AM

## 2018-05-14 NOTE — Progress Notes (Signed)
Physical Therapy Treatment Patient Details Name: Kelly [name] MRN: [id number] DOB: 10-06-1946 Today's Date: 05/14/2018    History of Present Illness 72 y.o. female with hx of R distal femur fx with IM nail and had hardware removed 03/2018, R THA 2014 and has had multiple R hip I&D admitted for R TKA (preop significant valgus deformity) and has right foot drop.    PT Comments    Pt assisted back to bed from recliner and encouraged to perform one more sit to stand for strengthening/technique.  Pt declined ambulating (appears anxious and fearful of falling) so encouraged her to rest well tonight in hopes of taking a few steps tomorrow.   Follow Up Recommendations  SNF     Equipment Recommendations  None recommended by PT    Recommendations for Other Services       Precautions / Restrictions Precautions Precautions: Fall;Knee Required Braces or Orthoses: Knee Immobilizer - Right;Other Brace/Splint Other Brace/Splint: utilized bil KI for stability, AFO Restrictions Other Position/Activity Restrictions: WBAT    Mobility  Bed Mobility Overal bed mobility: Needs Assistance Bed Mobility: Sit to Supine     Supine to sit: Min assist Sit to supine: Mod assist   General bed mobility comments: assist for LEs onto bed  Transfers Overall transfer level: Needs assistance Equipment used: Rolling walker (2 wheeled) Transfers: Sit to/from [facility] Sit to Stand: Max assist;+2 physical assistance;+2 safety/equipment Stand pivot transfers: Mod assist;+2 physical assistance;+2 safety/equipment       General transfer comment: multimodal cues for technique, assist to rise, steady and control descent, performed stand pivot to recliner with weight shifting cues; performed sit to stand once more technique/strengthening    Ambulation/Gait             General Gait Details: pt declined attempting today however encouraged her to rest well tonight for attempting  tomorrow.   Stairs             Wheelchair Mobility    Modified Rankin (Stroke Patients Only)       Balance                                            Cognition Arousal/Alertness: Awake/alert Behavior During Therapy: WFL for tasks assessed/performed Overall Cognitive Status: Within Functional Limits for tasks assessed                                        Exercises     General Comments        Pertinent Vitals/Pain Pain Assessment: 0-10 Pain Score: 4  Pain Location: R knee Pain Descriptors / Indicators: Sore;Aching Pain Intervention(s): Limited activity within patient's tolerance;Repositioned;Monitored during session;Premedicated before session    Home Living                      Prior Function            PT Goals (current goals can now be found in the care plan section) Progress towards PT goals: Progressing toward goals    Frequency    7X/week      PT Plan Current plan remains appropriate    Co-evaluation              AM-PAC PT "6 Clicks" Daily Activity  Outcome  Measure  Difficulty turning over in bed (including adjusting bedclothes, sheets and blankets)?: Unable Difficulty moving from lying on back to sitting on the side of the bed? : Unable Difficulty sitting down on and standing up from a chair with arms (e.g., wheelchair, bedside commode, etc,.)?: Unable Help needed moving to and from a bed to chair (including a wheelchair)?: A Lot Help needed walking in hospital room?: Total Help needed climbing 3-5 steps with a railing? : Total 6 Click Score: 7    End of Session Equipment Utilized During Treatment: Gait belt;Right knee immobilizer;Other (comment) Activity Tolerance: Patient tolerated treatment well Patient left: in bed;with call bell/phone within reach;with family/visitor present Nurse Communication: Mobility status PT Visit Diagnosis: Difficulty in walking, not elsewhere classified  (R26.2)     Time: 1855-0158 PT Time Calculation (min) (ACUTE ONLY): 17 min  Charges:   $Therapeutic Activity: 8-22 mins                    G Codes:       Carmelia Bake, PT, DPT 05/14/2018 Pager: [phone]  York Ram E 05/14/2018, 4:12 PM

## 2018-05-14 NOTE — Progress Notes (Signed)
   Subjective: 4 Days Post-Op Procedure(s) (LRB): RIGHT TOTAL KNEE ARTHROPLASTY; femoral osteotomy (Right) Patient reports pain as moderate.   Patient seen in rounds with Dr. Wynelle Link. Patient is continuing to progress slowly due to pain and foot drop. No SOB or chest pain. Placement found at [facility]. Plan is to go Skilled nursing facility after hospital stay.  Objective: Vital signs in last 24 hours: Temp:  [98 F (36.7 C)-100.2 F (37.9 C)] 98.1 F (36.7 C) (06/07 0552) Pulse Rate:  [79-127] 79 (06/07 0552) Resp:  [16-17] 17 (06/06 2215) BP: (91-154)/(46-95) 119/60 (06/07 0552) SpO2:  [68 %-100 %] 99 % (06/07 0552)  Intake/Output from previous day:  Intake/Output Summary (Last 24 hours) at 05/14/2018 0730 Last data filed at 05/14/2018 0557 Gross per 24 hour  Intake 1937.61 ml  Output 3150 ml  Net -1212.39 ml     Labs: Recent Labs    05/12/18 0533 05/13/18 0546 05/13/18 1956 05/14/18 0532  HGB 8.5* 7.5* 9.5* 9.5*   Recent Labs    05/13/18 0546 05/13/18 1956 05/14/18 0532  WBC 10.8*  --  9.7  RBC 2.68*  --  3.33*  HCT 24.0* 29.2* 30.1*  PLT 235  --  239   Recent Labs    05/12/18 0533  NA 136  K 4.6  CL 104  CO2 26  BUN 25*  CREATININE 0.83  GLUCOSE 176*  CALCIUM 8.9    EXAM General - Patient is Alert and Oriented Extremity - Sensation intact distally Intact pulses distally No cellulitis present Compartment soft  Foot drop right foot Dressing/Incision - clean, dry, no drainage   Past Medical History:  Diagnosis Date  . A-fib [facility])    patient reports she went into AFIB after cataract surgery in July 2018; reports being asymptomatic   . Arthritis    left knee arthritis   . Diabetes mellitus without complication ([place])    type 2   . Dysrhythmia   . Hypertension     Assessment/Plan: 4 Days Post-Op Procedure(s) (LRB): RIGHT TOTAL KNEE ARTHROPLASTY; femoral osteotomy (Right) Active Problems:   OA (osteoarthritis) of knee  Estimated body  mass index is 47.55 kg/m as calculated from the following:   Height as of this encounter: 5\' 2"  (1.575 m).   Weight as of this encounter: 117.9 kg (260 lb). Advance diet Up with therapy  DVT Prophylaxis - Eliquis Weight-Bearing as tolerated   Continue to work with therapy today and this weekend. Plan for SNF [facility] on Monday pending progress.   Ardeen Jourdain, PA-C Orthopaedic Surgery 05/14/2018, 7:30 AM

## 2018-05-15 LAB — GLUCOSE, CAPILLARY
GLUCOSE-CAPILLARY: 142 mg/dL — AB (ref 65–99)
GLUCOSE-CAPILLARY: 186 mg/dL — AB (ref 65–99)
Glucose-Capillary: 147 mg/dL — ABNORMAL HIGH (ref 65–99)
Glucose-Capillary: 167 mg/dL — ABNORMAL HIGH (ref 65–99)

## 2018-05-15 NOTE — Progress Notes (Signed)
Physical Therapy Treatment Patient Details Name: Kelly [name] MRN: [id number] DOB: 1946-05-30 Today's Date: 05/15/2018    History of Present Illness 72 y.o. female with hx of R distal femur fx with IM nail and had hardware removed 03/2018, R THA 2014 and has had multiple R hip I&D admitted for R TKA (preop significant valgus deformity) and has right foot drop.    PT Comments    Pt assisted with standing and able to weight shift today.  After seated rest break, pt assisted with taking a few steps forward!  Provided spouse with HEP handout to assist pt with performing exercises.  Pt also with pain at R heel, presents with redness so educated pt and family to keep heels floating and demonstrated positioning of pillows.  Plans for SNF on Monday.    Follow Up Recommendations  SNF     Equipment Recommendations  None recommended by PT    Recommendations for Other Services       Precautions / Restrictions Precautions Precautions: Fall;Knee Required Braces or Orthoses: Knee Immobilizer - Right;Other Brace/Splint Other Brace/Splint: used R KI only for stability today 6/8, AFO    Mobility  Bed Mobility Overal bed mobility: Needs Assistance Bed Mobility: Sit to Supine     Supine to sit: Min guard;HOB elevated Sit to supine: Mod assist   General bed mobility comments: pt utilized bed rails to self assist to sitting, required assist for bil LEs onto bed with fatigue  Transfers Overall transfer level: Needs assistance Equipment used: Rolling walker (2 wheeled) Transfers: Sit to/from [facility] Sit to Stand: Max assist;+2 physical assistance;+2 safety/equipment Stand pivot transfers: From elevated surface       General transfer comment: multimodal cues for technique, assist to rise, steady and control descent, performed once with weight shifting upon standing, small instance of L LE buckle however pt self corrected, assisted back to sitting for rest break, pt  agreeable to attempt a few steps forwards after rest break  Ambulation/Gait Ambulation/Gait assistance: Mod assist;+2 safety/equipment;+2 physical assistance Ambulation Distance (Feet): 4 Feet Assistive device: Rolling walker (2 wheeled) Gait Pattern/deviations: Step-to pattern;Decreased stance time - right     General Gait Details: pt agreeable to take a few steps (6 steps!) with bed following behind her (had NT in to assist with moving bed)   Stairs             Wheelchair Mobility    Modified Rankin (Stroke Patients Only)       Balance                                            Cognition Arousal/Alertness: Awake/alert Behavior During Therapy: WFL for tasks assessed/performed Overall Cognitive Status: Within Functional Limits for tasks assessed                                        Exercises      General Comments        Pertinent Vitals/Pain Pain Assessment: 0-10 Pain Score: 6  Pain Location: R knee Pain Descriptors / Indicators: Sore;Aching Pain Intervention(s): Limited activity within patient's tolerance;Repositioned;Monitored during session;Premedicated before session;Ice applied    Home Living  Prior Function            PT Goals (current goals can now be found in the care plan section) Progress towards PT goals: Progressing toward goals    Frequency    7X/week      PT Plan Current plan remains appropriate    Co-evaluation              AM-PAC PT "6 Clicks" Daily Activity  Outcome Measure  Difficulty turning over in bed (including adjusting bedclothes, sheets and blankets)?: Unable Difficulty moving from lying on back to sitting on the side of the bed? : Unable Difficulty sitting down on and standing up from a chair with arms (e.g., wheelchair, bedside commode, etc,.)?: Unable Help needed moving to and from a bed to chair (including a wheelchair)?: A Lot Help needed  walking in hospital room?: A Lot Help needed climbing 3-5 steps with a railing? : Total 6 Click Score: 8    End of Session Equipment Utilized During Treatment: Gait belt;Right knee immobilizer;Other (comment) Activity Tolerance: Patient tolerated treatment well Patient left: in bed;with call bell/phone within reach;with family/visitor present Nurse Communication: Mobility status PT Visit Diagnosis: Difficulty in walking, not elsewhere classified (R26.2)     Time: 0131-4388 PT Time Calculation (min) (ACUTE ONLY): 25 min  Charges:  $Gait Training: 8-22 mins $Therapeutic Activity: 8-22 mins                    G Codes:       Carmelia Bake, PT, DPT 05/15/2018 Pager: [phone]  York Ram E 05/15/2018, 1:03 PM

## 2018-05-15 NOTE — Progress Notes (Signed)
Subjective: 5 Days Post-Op Procedure(s) (LRB): RIGHT TOTAL KNEE ARTHROPLASTY; femoral osteotomy (Right)  Patient reports pain as mild to moderate.  Tolerating POs well.  Admits to flatus.  Denies fever, chills, N/V, CP, SOB.  Notes that her sensation and ROM in her foot as slowly improving.  Objective:   VITALS:  Temp:  [98 F (36.7 C)-98.9 F (37.2 C)] 98.4 F (36.9 C) (06/08 0602) Pulse Rate:  [92-103] 92 (06/08 0602) Resp:  [14-19] 14 (06/08 0602) BP: (142-161)/(71-78) 142/78 (06/08 0602) SpO2:  [95 %-98 %] 97 % (06/08 0602)  General: WDWN patient in NAD. Psych:  Appropriate mood and affect. Neuro:  A&O x 3, Moving all extremities, sensation intact to light touch and improving along R foot but still subjectively diminished HEENT:  EOMs intact Chest:  Even non-labored respirations Skin: Dressing C/D/I, no rashes or lesions Extremities: warm/dry, mild edema, no erythema or echymosis.  No lymphadenopathy. Pulses: Femoral 2+ MSK:  ROM: lacks 5 degrees of TKE, able to do small circles with her R foot, MMT: able to perform quad set, (-) Homan's    LABS Recent Labs    05/13/18 0546 05/13/18 1956 05/14/18 0532  HGB 7.5* 9.5* 9.5*  WBC 10.8*  --  9.7  PLT 235  --  239   No results for input(s): NA, K, CL, CO2, BUN, CREATININE, GLUCOSE in the last 72 hours. No results for input(s): LABPT, INR in the last 72 hours.   Assessment/Plan: 5 Days Post-Op Procedure(s) (LRB): RIGHT TOTAL KNEE ARTHROPLASTY; femoral osteotomy (Right)  Patient seen in rounds for Dr. Mellissa Kohut R LE Up with therapy Continue working on ROM of R ankle Eliquis for DVT prophylaxis Disp: SNF, [facility] on Monday Plan for 2 week outpatient post-op visit with Dr. Francia Greaves PA-C [facility] Office:  [phone]

## 2018-05-16 LAB — GLUCOSE, CAPILLARY
Glucose-Capillary: 159 mg/dL — ABNORMAL HIGH (ref 65–99)
Glucose-Capillary: 156 mg/dL — ABNORMAL HIGH (ref 65–99)
Glucose-Capillary: 162 mg/dL — ABNORMAL HIGH (ref 65–99)
Glucose-Capillary: 199 mg/dL — ABNORMAL HIGH (ref 65–99)

## 2018-05-16 NOTE — Progress Notes (Signed)
Physical Therapy Treatment Patient Details Name: Kelly [name] MRN: [id number] DOB: December 01, 1946 Today's Date: 05/16/2018    History of Present Illness 72 y.o. female with hx of R distal femur fx with IM nail and had hardware removed 03/2018, R THA 2014 and has had multiple R hip I&D admitted for R TKA (preop significant valgus deformity) and has right foot drop.    PT Comments    POD # 6 pm session Pulled recliner into hallway and had pt amb from recliner to bed approx 10 feet with great difficulty and + 2 assist side by side and a third following with recliner.    Follow Up Recommendations  SNF     Equipment Recommendations  None recommended by PT    Recommendations for Other Services       Precautions / Restrictions Precautions Precautions: Fall;Knee Precaution Comments: KI B LE Required Braces or Orthoses: Knee Immobilizer - Right;Other Brace/Splint Other Brace/Splint: used R KI only for stability today 6/8, AFO Restrictions Weight Bearing Restrictions: No Other Position/Activity Restrictions: WBAT    Mobility  Bed Mobility Overal bed mobility: Needs Assistance Bed Mobility: Supine to Sit     Supine to sit: Max assist;+2 for physical assistance;+2 for safety/equipment     General bed mobility comments: pt utilized bed rails to self assist to sitting, required assist for bil LEs   Transfers Overall transfer level: Needs assistance Equipment used: Rolling walker (2 wheeled) Transfers: Sit to/from Stand Sit to Stand: Max assist;+2 physical assistance;+2 safety/equipment         General transfer comment: + 2 side by side assist from elevated surface plus increased assist from lower recliner level  Ambulation/Gait Ambulation/Gait assistance: Mod assist;+2 safety/equipment;+2 physical assistance Ambulation Distance (Feet): 10 Feet Assistive device: Rolling walker (2 wheeled) Gait Pattern/deviations: Step-to pattern;Decreased stance time - right Gait velocity:  decreased   General Gait Details: great difficulty weigh shifting to R to advance L LE.  recliner close behind.  Increased anxiety with with increased distance.   Stairs             Wheelchair Mobility    Modified Rankin (Stroke Patients Only)       Balance                                            Cognition Arousal/Alertness: Awake/alert Behavior During Therapy: WFL for tasks assessed/performed Overall Cognitive Status: Within Functional Limits for tasks assessed                                 General Comments: slightly anxious about walking      Exercises      General Comments        Pertinent Vitals/Pain Pain Assessment: 0-10 Pain Score: 5  Pain Location: R knee Pain Descriptors / Indicators: Sore;Aching Pain Intervention(s): Monitored during session;Repositioned;Ice applied    Home Living                      Prior Function            PT Goals (current goals can now be found in the care plan section) Progress towards PT goals: Progressing toward goals    Frequency    7X/week      PT Plan Current plan remains appropriate  Co-evaluation              AM-PAC PT "6 Clicks" Daily Activity  Outcome Measure  Difficulty turning over in bed (including adjusting bedclothes, sheets and blankets)?: A Lot Difficulty moving from lying on back to sitting on the side of the bed? : A Lot Difficulty sitting down on and standing up from a chair with arms (e.g., wheelchair, bedside commode, etc,.)?: A Lot Help needed moving to and from a bed to chair (including a wheelchair)?: A Lot Help needed walking in hospital room?: A Lot Help needed climbing 3-5 steps with a railing? : Total 6 Click Score: 11    End of Session Equipment Utilized During Treatment: Gait belt;Right knee immobilizer;Other (comment) Activity Tolerance: Patient tolerated treatment well Patient left: in chair;with call bell/phone within  reach;with family/visitor present Nurse Communication: Mobility status PT Visit Diagnosis: Difficulty in walking, not elsewhere classified (R26.2)     Time: 1405-1430 PT Time Calculation (min) (ACUTE ONLY): 25 min  Charges:  $Gait Training: 8-22 mins $Therapeutic Activity: 8-22 mins                    G Codes:       Rica Koyanagi  PTA [facility]  Acute  Rehab Pager      [phone]

## 2018-05-16 NOTE — Progress Notes (Signed)
LAWRENCIA MAUNEY  MRN: [id number] DOB/Age: [birth year] 72 y.o. [facility] Procedure: Procedure(s) (LRB): RIGHT TOTAL KNEE ARTHROPLASTY; femoral osteotomy (Right)     Subjective: In good spirits, no complaints. Passing flatus but no bowel movement which she states is not uncommon for her  Vital Signs Temp:  [97.7 F (36.5 C)-98.6 F (37 C)] 98.6 F (37 C) (06/09 0604) Pulse Rate:  [88-96] 96 (06/09 0604) Resp:  [16] 16 (06/09 0604) BP: (152-153)/(68-81) 153/68 (06/09 0604) SpO2:  [93 %-95 %] 93 % (06/09 0604)  Lab Results Recent Labs    05/13/18 1956 05/14/18 0532  WBC  --  9.7  HGB 9.5* 9.5*  HCT 29.2* 30.1*  PLT  --  239   BMET No results for input(s): NA, K, CL, CO2, GLUCOSE, BUN, CREATININE, CALCIUM in the last 72 hours. INR  Date Value Ref Range Status  05/07/2018 1.16  Final    Comment:    Performed at [facility], [place] [address] [place], [place] [address]     Exam Right knee dressings dry. Still weak on EHL and DF but patient feels it is improved        Plan Cont therapies and current treatment Skilled placement tomorrow  Jenetta Loges PA-C  05/16/2018, 9:08 AM Contact # [phone]

## 2018-05-16 NOTE — Progress Notes (Signed)
RN speaking with patient this AM about her last bowel movement. RN concerned that patient has not had a bowel movement since Monday, 6/3.   Miralax was offered. Patient refused and stated "I will go when I get home, ive gone 16 days without having one before." RN continued education, but patient refused.

## 2018-05-16 NOTE — Progress Notes (Signed)
Physical Therapy Treatment Patient Details Name: Kelly [name] MRN: [id number] DOB: 1946-05-20 Today's Date: 05/16/2018    History of Present Illness 72 y.o. female with hx of R distal femur fx with IM nail and had hardware removed 03/2018, R THA 2014 and has had multiple R hip I&D admitted for R TKA (preop significant valgus deformity) and has right foot drop.    PT Comments    POD # 6 am session Assisted OOB to amb 4 feet required + 2 assist and a third following with recliner.   Follow Up Recommendations  SNF     Equipment Recommendations  None recommended by PT    Recommendations for Other Services       Precautions / Restrictions Precautions Precautions: Fall;Knee Precaution Comments: KI B LE Required Braces or Orthoses: Knee Immobilizer - Right;Other Brace/Splint Other Brace/Splint: used R KI only for stability today 6/8, AFO Restrictions Weight Bearing Restrictions: No Other Position/Activity Restrictions: WBAT    Mobility  Bed Mobility Overal bed mobility: Needs Assistance Bed Mobility: Supine to Sit     Supine to sit: Max assist;+2 for physical assistance;+2 for safety/equipment     General bed mobility comments: pt utilized bed rails to self assist to sitting, required assist for bil LEs   Transfers Overall transfer level: Needs assistance Equipment used: Rolling walker (2 wheeled) Transfers: Sit to/from Stand Sit to Stand: Max assist;+2 physical assistance;+2 safety/equipment         General transfer comment: + 2 side by side assist from elevated surface plus increased assist from lower recliner level  Ambulation/Gait Ambulation/Gait assistance: Mod assist;+2 safety/equipment;+2 physical assistance Ambulation Distance (Feet): 4 Feet Assistive device: Rolling walker (2 wheeled) Gait Pattern/deviations: Step-to pattern;Decreased stance time - right Gait velocity: decreased   General Gait Details: great difficulty weigh shifting to R to advance  L LE.  recliner close behind.  Increased anxiety with with increased distance.   Stairs             Wheelchair Mobility    Modified Rankin (Stroke Patients Only)       Balance                                            Cognition Arousal/Alertness: Awake/alert Behavior During Therapy: WFL for tasks assessed/performed Overall Cognitive Status: Within Functional Limits for tasks assessed                                 General Comments: slightly anxious about walking      Exercises      General Comments        Pertinent Vitals/Pain Pain Assessment: 0-10 Pain Score: 5  Pain Location: R knee Pain Descriptors / Indicators: Sore;Aching Pain Intervention(s): Monitored during session;Repositioned;Ice applied    Home Living                      Prior Function            PT Goals (current goals can now be found in the care plan section) Progress towards PT goals: Progressing toward goals    Frequency    7X/week      PT Plan Current plan remains appropriate    Co-evaluation              AM-PAC  PT "6 Clicks" Daily Activity  Outcome Measure  Difficulty turning over in bed (including adjusting bedclothes, sheets and blankets)?: A Lot Difficulty moving from lying on back to sitting on the side of the bed? : A Lot Difficulty sitting down on and standing up from a chair with arms (e.g., wheelchair, bedside commode, etc,.)?: A Lot Help needed moving to and from a bed to chair (including a wheelchair)?: A Lot Help needed walking in hospital room?: A Lot Help needed climbing 3-5 steps with a railing? : Total 6 Click Score: 11    End of Session Equipment Utilized During Treatment: Gait belt;Right knee immobilizer;Other (comment) Activity Tolerance: Patient tolerated treatment well Patient left: in chair;with call bell/phone within reach;with family/visitor present Nurse Communication: Mobility status PT Visit  Diagnosis: Difficulty in walking, not elsewhere classified (R26.2)     Time: 9574-7340 PT Time Calculation (min) (ACUTE ONLY): 27 min  Charges:  $Gait Training: 8-22 mins $Therapeutic Activity: 8-22 mins                    G Codes:       Rica Koyanagi  PTA [facility]  Acute  Rehab Pager      [phone]

## 2018-05-17 ENCOUNTER — Other Ambulatory Visit: Payer: Self-pay

## 2018-05-17 LAB — GLUCOSE, CAPILLARY
GLUCOSE-CAPILLARY: 152 mg/dL — AB (ref 65–99)
Glucose-Capillary: 166 mg/dL — ABNORMAL HIGH (ref 65–99)

## 2018-05-17 MED ORDER — FERROUS SULFATE 325 (65 FE) MG PO TABS: 325.0000 mg | ORAL_TABLET | Freq: Two times a day (BID) | ORAL | 0 refills | Status: DC

## 2018-05-17 MED ORDER — TRAMADOL HCL 50 MG PO TABS: 50.0000 mg | ORAL_TABLET | Freq: Four times a day (QID) | ORAL | 0 refills | Status: DC | PRN

## 2018-05-17 MED ORDER — TRAMADOL HCL 50 MG PO TABS: ORAL_TABLET | ORAL | 0 refills | Status: DC

## 2018-05-17 MED ORDER — HYDROMORPHONE HCL 2 MG PO TABS: 2.0000 mg | ORAL_TABLET | ORAL | 0 refills | Status: DC | PRN

## 2018-05-17 MED ORDER — GABAPENTIN 300 MG PO CAPS: 300.0000 mg | ORAL_CAPSULE | Freq: Three times a day (TID) | ORAL | 0 refills | Status: DC

## 2018-05-17 MED ORDER — DOCUSATE SODIUM 100 MG PO CAPS: 100.0000 mg | ORAL_CAPSULE | Freq: Two times a day (BID) | ORAL | 0 refills | Status: DC

## 2018-05-17 MED ORDER — HYDROMORPHONE HCL 2 MG PO TABS: ORAL_TABLET | ORAL | 0 refills | Status: DC

## 2018-05-17 NOTE — Discharge Summary (Signed)
Physician Discharge Summary   Patient ID: Kelly [name] MRN: [id number] DOB/AGE: 1946-02-08 72 y.o.  Admit date: 05/10/2018 Discharge date: 05/17/2018  Primary Diagnosis: Post-traumatic osteoarthritis right knee  Admission Diagnoses:  Past Medical History:  Diagnosis Date  . A-fib [facility])    patient reports she went into AFIB after cataract surgery in July 2018; reports being asymptomatic   . Arthritis    left knee arthritis   . Diabetes mellitus without complication ([place])    type 2   . Dysrhythmia   . Hypertension    Discharge Diagnoses:   Active Problems:   OA (osteoarthritis) of knee  Estimated body mass index is 47.55 kg/m as calculated from the following:   Height as of this encounter: '5\' 2"'  (1.575 m).   Weight as of this encounter: 117.9 kg (260 lb).  Procedure:  Procedure(s) (LRB): RIGHT TOTAL KNEE ARTHROPLASTY; femoral osteotomy (Right)   Consults: None  HPI:  Kelly [name], 72 y.o. female, has a history of pain and functional disability in the right knee due to trauma and arthritis and has failed non-surgical conservative treatments for greater than 12 weeks to includeNSAID's and/or analgesics, corticosteriod injections, viscosupplementation injections, flexibility and strengthening excercises, use of assistive devices and activity modification.  Onset of symptoms was gradual, starting 3 years ago with gradually worsening course since that time. The patient noted prior procedures on the knee to include  ORIF distal femur and subsequent hardware removal on the right knee(s).  Patient currently rates pain in the right knee(s) at 8 out of 10 with activity. Patient has night pain, worsening of pain with activity and weight bearing, pain that interferes with activities of daily living, pain with passive range of motion, crepitus and joint swelling.  Patient has evidence of subchondral cysts, subchondral sclerosis, periarticular osteophytes, joint subluxation and joint space  narrowing by imaging studies. This patient has had distal femur fracture. There is no active infection.    Laboratory Data: Admission on 05/10/2018  Component Date Value Ref Range Status  . Glucose-Capillary 05/10/2018 117* 65 - 99 mg/dL Final  . Comment 1 05/10/2018 Notify RN   Final  . WBC 05/11/2018 10.1  4.0 - 10.5 K/uL Final   WHITE COUNT CONFIRMED ON SMEAR  . RBC 05/11/2018 3.24* 3.87 - 5.11 MIL/uL Final  . Hemoglobin 05/11/2018 9.3* 12.0 - 15.0 g/dL Final  . HCT 05/11/2018 29.5* 36.0 - 46.0 % Final  . MCV 05/11/2018 91.0  78.0 - 100.0 fL Final  . MCH 05/11/2018 28.7  26.0 - 34.0 pg Final  . MCHC 05/11/2018 31.5  30.0 - 36.0 g/dL Final  . RDW 05/11/2018 14.6  11.5 - 15.5 % Final  . Platelets 05/11/2018 274  150 - 400 K/uL Final   Performed at [facility], [place] [address], [place], [place] [address]  . Sodium 05/11/2018 137  135 - 145 mmol/L Final  . Potassium 05/11/2018 4.1  3.5 - 5.1 mmol/L Final  . Chloride 05/11/2018 100* 101 - 111 mmol/L Final  . CO2 05/11/2018 24  22 - 32 mmol/L Final  . Glucose, Bld 05/11/2018 231* 65 - 99 mg/dL Final  . BUN 05/11/2018 16  6 - 20 mg/dL Final  . Creatinine, Ser 05/11/2018 0.59  0.44 - 1.00 mg/dL Final  . Calcium 05/11/2018 8.9  8.9 - 10.3 mg/dL Final  . GFR calc non Af Amer 05/11/2018 >60  >60 mL/min Final  . GFR calc Af Amer 05/11/2018 >60  >60 mL/min Final  Comment: (NOTE) The eGFR has been calculated using the CKD EPI equation. This calculation has not been validated in all clinical situations. eGFR's persistently <60 mL/min signify possible Chronic Kidney Disease.   Kelly [name] gap 05/11/2018 13  5 - 15 Final   Performed at [facility], [place] [address] [place], [place] [address]  . Glucose-Capillary 05/10/2018 102* 65 - 99 mg/dL Final  . Glucose-Capillary 05/10/2018 200* 65 - 99 mg/dL Final  . Glucose-Capillary 05/11/2018 193* 65 - 99 mg/dL Final  . Glucose-Capillary 05/11/2018 180* 65 - 99  mg/dL Final  . Glucose-Capillary 05/11/2018 224* 65 - 99 mg/dL Final  . WBC 05/12/2018 11.4* 4.0 - 10.5 K/uL Final  . RBC 05/12/2018 3.00* 3.87 - 5.11 MIL/uL Final  . Hemoglobin 05/12/2018 8.5* 12.0 - 15.0 g/dL Final  . HCT 05/12/2018 26.9* 36.0 - 46.0 % Final  . MCV 05/12/2018 89.7  78.0 - 100.0 fL Final  . MCH 05/12/2018 28.3  26.0 - 34.0 pg Final  . MCHC 05/12/2018 31.6  30.0 - 36.0 g/dL Final  . RDW 05/12/2018 15.1  11.5 - 15.5 % Final  . Platelets 05/12/2018 256  150 - 400 K/uL Final   Performed at [facility], [place] [address], [place], [place] [address]  . Sodium 05/12/2018 136  135 - 145 mmol/L Final  . Potassium 05/12/2018 4.6  3.5 - 5.1 mmol/L Final  . Chloride 05/12/2018 104  101 - 111 mmol/L Final  . CO2 05/12/2018 26  22 - 32 mmol/L Final  . Glucose, Bld 05/12/2018 176* 65 - 99 mg/dL Final  . BUN 05/12/2018 25* 6 - 20 mg/dL Final  . Creatinine, Ser 05/12/2018 0.83  0.44 - 1.00 mg/dL Final  . Calcium 05/12/2018 8.9  8.9 - 10.3 mg/dL Final  . GFR calc non Af Amer 05/12/2018 >60  >60 mL/min Final  . GFR calc Af Amer 05/12/2018 >60  >60 mL/min Final   Comment: (NOTE) The eGFR has been calculated using the CKD EPI equation. This calculation has not been validated in all clinical situations. eGFR's persistently <60 mL/min signify possible Chronic Kidney Disease.   Kelly [name] gap 05/12/2018 6  5 - 15 Final   Performed at [facility], [place] [address], [place], [place] [address]  . Glucose-Capillary 05/11/2018 200* 65 - 99 mg/dL Final  . Glucose-Capillary 05/12/2018 153* 65 - 99 mg/dL Final  . Comment 1 05/12/2018 Document in Chart   Final  . Glucose-Capillary 05/12/2018 143* 65 - 99 mg/dL Final  . Comment 1 05/12/2018 Document in Chart   Final  . WBC 05/13/2018 10.8* 4.0 - 10.5 K/uL Final  . RBC 05/13/2018 2.68* 3.87 - 5.11 MIL/uL Final  . Hemoglobin 05/13/2018 7.5* 12.0 - 15.0 g/dL Final  . HCT 05/13/2018 24.0* 36.0 - 46.0 % Final  . MCV  05/13/2018 89.6  78.0 - 100.0 fL Final  . MCH 05/13/2018 28.0  26.0 - 34.0 pg Final  . MCHC 05/13/2018 31.3  30.0 - 36.0 g/dL Final  . RDW 05/13/2018 15.3  11.5 - 15.5 % Final  . Platelets 05/13/2018 235  150 - 400 K/uL Final   Performed at [facility], [place] [address], [place], [place] [address]  . Glucose-Capillary 05/12/2018 114* 65 - 99 mg/dL Final  . Comment 1 05/12/2018 Document in Chart   Final  . Glucose-Capillary 05/12/2018 184* 65 - 99 mg/dL Final  . Glucose-Capillary 05/13/2018 148* 65 - 99 mg/dL Final  . Order Confirmation  05/13/2018    Final                   Value:ORDER PROCESSED BY BLOOD BANK Performed at [place] [address], [place], [place] [address]   . Glucose-Capillary 05/13/2018 141* 65 - 99 mg/dL Final  . WBC 05/14/2018 9.7  4.0 - 10.5 K/uL Final  . RBC 05/14/2018 3.33* 3.87 - 5.11 MIL/uL Final  . Hemoglobin 05/14/2018 9.5* 12.0 - 15.0 g/dL Final  . HCT 05/14/2018 30.1* 36.0 - 46.0 % Final  . MCV 05/14/2018 90.4  78.0 - 100.0 fL Final  . MCH 05/14/2018 28.5  26.0 - 34.0 pg Final  . MCHC 05/14/2018 31.6  30.0 - 36.0 g/dL Final  . RDW 05/14/2018 15.9* 11.5 - 15.5 % Final  . Platelets 05/14/2018 239  150 - 400 K/uL Final   Performed at [facility], [place] [address], [place], [place] [address]  . Hemoglobin 05/13/2018 9.5* 12.0 - 15.0 g/dL Final   Comment: DELTA CHECK NOTED POST TRANSFUSION SPECIMEN   . HCT 05/13/2018 29.2* 36.0 - 46.0 % Final   Performed at [facility] [address] [place], [place] [address]  . Glucose-Capillary 05/13/2018 172* 65 - 99 mg/dL Final  . Comment 1 05/13/2018 Notify RN   Final  . Comment 2 05/13/2018 Document in Chart   Final  . Glucose-Capillary 05/14/2018 127* 65 - 99 mg/dL Final  . Comment 1 05/14/2018 Document in Chart   Final  . Glucose-Capillary 05/14/2018 158* 65 - 99 mg/dL Final  . Comment 1 05/14/2018 Document in Chart   Final  .  Glucose-Capillary 05/13/2018 182* 65 - 99 mg/dL Final  . Glucose-Capillary 05/14/2018 155* 65 - 99 mg/dL Final  . Comment 1 05/14/2018 Document in Chart   Final  . Glucose-Capillary 05/14/2018 190* 65 - 99 mg/dL Final  . Comment 1 05/14/2018 Notify RN   Final  . Comment 2 05/14/2018 Document in Chart   Final  . Glucose-Capillary 05/15/2018 147* 65 - 99 mg/dL Final  . Glucose-Capillary 05/15/2018 167* 65 - 99 mg/dL Final  . Glucose-Capillary 05/15/2018 142* 65 - 99 mg/dL Final  . Glucose-Capillary 05/15/2018 186* 65 - 99 mg/dL Final  . Glucose-Capillary 05/16/2018 159* 65 - 99 mg/dL Final  . Glucose-Capillary 05/16/2018 156* 65 - 99 mg/dL Final  . Glucose-Capillary 05/16/2018 162* 65 - 99 mg/dL Final  . Glucose-Capillary 05/16/2018 199* 65 - 99 mg/dL Final  Hospital Outpatient Visit on 05/07/2018  Component Date Value Ref Range Status  . aPTT 05/07/2018 32  24 - 36 seconds Final   Performed at [facility] [place], [place] [address] [place],  [address]  . WBC 05/07/2018 7.2  4.0 - 10.5 K/uL Final  . RBC 05/07/2018 3.81* 3.87 - 5.11 MIL/uL Final  . Hemoglobin 05/07/2018 10.8* 12.0 - 15.0 g/dL Final  . HCT 05/07/2018 34.8* 36.0 - 46.0 % Final  . MCV 05/07/2018 91.3  78.0 - 100.0 fL Final  . MCH 05/07/2018 28.3  26.0 - 34.0 pg Final  . MCHC 05/07/2018 31.0  30.0 - 36.0 g/dL Final  . RDW 05/07/2018 14.9  11.5 - 15.5 % Final  . Platelets 05/07/2018 308  150 - 400 K/uL Final  . Neutrophils Relative % 05/07/2018 61  % Final  . Neutro Abs 05/07/2018 4.3  1.7 - 7.7 K/uL Final  . Lymphocytes Relative 05/07/2018 29  % Final  . Lymphs Abs 05/07/2018 2.1  0.7 - 4.0 K/uL  Final  . Monocytes Relative 05/07/2018 8  % Final  . Monocytes Absolute 05/07/2018 0.6  0.1 - 1.0 K/uL Final  . Eosinophils Relative 05/07/2018 2  % Final  . Eosinophils Absolute 05/07/2018 0.2  0.0 - 0.7 K/uL Final  . Basophils Relative 05/07/2018 0  % Final  . Basophils Absolute 05/07/2018 0.0  0.0 - 0.1 K/uL  Final   Performed at [facility], [place] [address], [place], [place] [address]  . Sodium 05/07/2018 142  135 - 145 mmol/L Final  . Potassium 05/07/2018 4.6  3.5 - 5.1 mmol/L Final  . Chloride 05/07/2018 107  101 - 111 mmol/L Final  . CO2 05/07/2018 26  22 - 32 mmol/L Final  . Glucose, Bld 05/07/2018 179* 65 - 99 mg/dL Final  . BUN 05/07/2018 19  6 - 20 mg/dL Final  . Creatinine, Ser 05/07/2018 0.60  0.44 - 1.00 mg/dL Final  . Calcium 05/07/2018 9.9  8.9 - 10.3 mg/dL Final  . Total Protein 05/07/2018 7.7  6.5 - 8.1 g/dL Final  . Albumin 05/07/2018 4.1  3.5 - 5.0 g/dL Final  . AST 05/07/2018 23  15 - 41 U/L Final  . ALT 05/07/2018 13* 14 - 54 U/L Final  . Alkaline Phosphatase 05/07/2018 106  38 - 126 U/L Final  . Total Bilirubin 05/07/2018 1.1  0.3 - 1.2 mg/dL Final  . GFR calc non Af Amer 05/07/2018 >60  >60 mL/min Final  . GFR calc Af Amer 05/07/2018 >60  >60 mL/min Final   Comment: (NOTE) The eGFR has been calculated using the CKD EPI equation. This calculation has not been validated in all clinical situations. eGFR's persistently <60 mL/min signify possible Chronic Kidney Disease.   Kelly [name] gap 05/07/2018 9  5 - 15 Final   Performed at [facility], [place] [address], [place], [place] [address]  . Prothrombin Time 05/07/2018 14.7  11.4 - 15.2 seconds Final  . INR 05/07/2018 1.16   Final   Performed at [facility], [place] [address], [place], [place] [address]  . ABO/RH(D) 05/07/2018 O POS   Final  . Antibody Screen 05/07/2018 NEG   Final  . Sample Expiration 05/07/2018 05/13/2018   Final  . Extend sample reason 05/07/2018 NO TRANSFUSIONS OR PREGNANCY IN THE PAST 3 MONTHS   Final  . Unit Number 05/07/2018 [id number]   Final  . Blood Component Type 05/07/2018 RED CELLS,LR   Final  . Unit division 05/07/2018 00   Final  . Status of Unit 05/07/2018 ISSUED,FINAL   Final  . Transfusion Status 05/07/2018 OK TO TRANSFUSE   Final  .  Crossmatch Result 05/07/2018 Compatible   Final  . Unit Number 05/07/2018 [id number]   Final  . Blood Component Type 05/07/2018 RED CELLS,LR   Final  . Unit division 05/07/2018 00   Final  . Status of Unit 05/07/2018 ISSUED,FINAL   Final  . Transfusion Status 05/07/2018 OK TO TRANSFUSE   Final  . Crossmatch Result 05/07/2018    Final                   Value:Compatible Performed at [facility], [place] [address], [place], [place] [address]   . Color, Urine 05/07/2018 STRAW* YELLOW Final  . APPearance 05/07/2018 CLEAR  CLEAR Final  . Specific Gravity, Urine 05/07/2018 1.008  1.005 - 1.030 Final  . pH 05/07/2018 7.0  5.0 - 8.0 Final  . Glucose, UA 05/07/2018 NEGATIVE  NEGATIVE  mg/dL Final  . Hgb urine dipstick 05/07/2018 NEGATIVE  NEGATIVE Final  . Bilirubin Urine 05/07/2018 NEGATIVE  NEGATIVE Final  . Ketones, ur 05/07/2018 NEGATIVE  NEGATIVE mg/dL Final  . Protein, ur 05/07/2018 NEGATIVE  NEGATIVE mg/dL Final  . Nitrite 05/07/2018 NEGATIVE  NEGATIVE Final  . Leukocytes, UA 05/07/2018 TRACE* NEGATIVE Final  . RBC / HPF 05/07/2018 0-5  0 - 5 RBC/hpf Final  . WBC, UA 05/07/2018 0-5  0 - 5 WBC/hpf Final  . Bacteria, UA 05/07/2018 NONE SEEN  NONE SEEN Final  . Squamous Epithelial / LPF 05/07/2018 0-5  0 - 5 Final   Performed at [facility], [place] [address], [place], [place] [address]  . MRSA, PCR 05/07/2018 NEGATIVE  NEGATIVE Final  . Staphylococcus aureus 05/07/2018 POSITIVE* NEGATIVE Final   Comment: (NOTE) The Xpert SA Assay (FDA approved for NASAL specimens in patients 64 years of age and older), is one component of a comprehensive surveillance program. It is not intended to diagnose infection nor to guide or monitor treatment. Performed at [facility], [place] [address], [place], [place] [address]   . ISSUE DATE / TIME 05/07/2018 [id number]   Final  . Blood Product Unit Number 05/07/2018 [id number]   Final  . PRODUCT  CODE 05/07/2018 [id number]   Final  . Unit Type and Rh 05/07/2018 5100   Final  . Blood Product Expiration Date 05/07/2018 [id number]   Final  . ISSUE DATE / TIME 05/07/2018 [id number]   Final  . Blood Product Unit Number 05/07/2018 [id number]   Final  . PRODUCT CODE 05/07/2018 [id number]   Final  . Unit Type and Rh 05/07/2018 5100   Final  . Blood Product Expiration Date 05/07/2018 [id number]   Final  Admission on 03/24/2018, Discharged on 03/25/2018  Component Date Value Ref Range Status  . Glucose-Capillary 03/24/2018 138* 65 - 99 mg/dL Final  . Specimen Description 03/24/2018    Final                   Value:THIGH RIGHT DRAINAGE Performed at [facility] [address] [place], [place] [address]   . Special Requests 03/24/2018    Final                   Value:NONE Performed at [facility], [place] [address] [place], [place] [address]   . Gram Stain 03/24/2018    Final                   Value:NO WBC SEEN NO ORGANISMS SEEN   . Culture 03/24/2018    Final                   Value:No growth aerobically or anaerobically. Performed at [facility], [place] [address], [place], [place] [address]   . Report Status 03/24/2018 03/29/2018 FINAL   Final  . Glucose-Capillary 03/24/2018 140* 65 - 99 mg/dL Final     X-Rays:Dg Knee 1-2 Views Right  Result Date: 05/10/2018 CLINICAL DATA:  Total knee arthroplasty. EXAM: RIGHT KNEE - 1-2 VIEW COMPARISON:  None. FINDINGS: The right knee demonstrates a total knee arthroplasty without evidence of hardware failure or complication. There is expected intra-articular air. There is no acute fracture or dislocation. Healed fracture deformity of the distal femoral diaphysis. Mild valgus angulation. Surgical instrumentation projects over the lateral lower thigh and tibial prosthetic component. IMPRESSION: Interval right total knee arthroplasty.  Mild valgus angulation. Electronically  Signed   By: Titus Dubin  M.D.   On: 05/10/2018 16:46    EKG: Orders placed or performed during the hospital encounter of 03/16/18  . EKG 12 lead  . EKG 12 lead     Hospital Course: NA WALDRIP is a 72 y.o. who was admitted to [facility]. They were brought to the operating room on 05/10/2018 and underwent Procedure(s): RIGHT TOTAL KNEE ARTHROPLASTY; femoral osteotomy.  Patient tolerated the procedure well and was later transferred to the recovery room and then to the orthopaedic floor for postoperative care.  They were given PO and IV analgesics for pain control following their surgery.  They were given 24 hours of postoperative antibiotics of  Anti-infectives (From admission, onward)   Start     Dose/Rate Route Frequency Ordered Stop   05/10/18 2030  ceFAZolin (ANCEF) IVPB 2g/100 mL premix     2 g 200 mL/hr over 30 Minutes Intravenous Every 6 hours 05/10/18 1823 05/11/18 0215   05/10/18 1030  ceFAZolin (ANCEF) IVPB 2g/100 mL premix     2 g 200 mL/hr over 30 Minutes Intravenous On call to O.R. 05/10/18 1021 05/10/18 1417     and started on DVT prophylaxis in the form of Eliquis, which she was on preoperatively.   PT and OT were ordered for total joint protocol.  Discharge planning consulted to help with postop disposition and equipment needs.  Patient had a difficult night on the evening of surgery.  They started to get up OOB with therapy on day one but progressed slowly due to pain and right foot drop. Hemovac drain was pulled without difficulty.  Continued to work with therapy into day two.  Dressing was changed on day two and the incision was clean and dry. It became apparent that the patient would require SNF placement. Patient continued inpatient PT and OT over the weekend while arrangements were made for SNF placement.  Patient was seen in rounds and was ready to go to SNF.   Diet: Cardiac diet and Diabetic diet Activity:WBAT Follow-up:in 8 days Disposition - Skilled nursing facility Discharged  Condition: stable    Allergies as of 05/17/2018      Reactions   Tape Other (See Comments)   Tears skin   Robaxin [methocarbamol] Nausea And Vomiting      Medication List    STOP taking these medications   cyclobenzaprine 10 MG tablet Commonly known as:  FLEXERIL   HYDROcodone-acetaminophen 7.5-325 MG tablet Commonly known as:  NORCO     TAKE these medications   acetaminophen 500 MG tablet Commonly known as:  TYLENOL Take 1,000 mg by mouth every 6 (six) hours as needed for moderate pain.   apixaban 5 MG Tabs tablet Commonly known as:  ELIQUIS Take 1 tablet (5 mg total) by mouth 2 (two) times daily.   docusate sodium 100 MG capsule Commonly known as:  COLACE Take 1 capsule (100 mg total) by mouth 2 (two) times daily.   ferrous sulfate 325 (65 FE) MG tablet Take 1 tablet (325 mg total) by mouth 2 (two) times daily with a meal.   FORTEO 600 MCG/2.4ML Soln Generic drug:  Teriparatide (Recombinant) Inject 20 mcg into the skin daily.   gabapentin 300 MG capsule Commonly known as:  NEURONTIN Take 1 capsule (300 mg total) by mouth 3 (three) times daily. Gabapentin 300 mg Protocol Take a 300 mg capsule three times a day for one week once DC from hospital Then a 300 mg  capsule twice a day for two weeks, Then a 300 mg capsule once a day for two weeks, then discontinue the Gabapentin.   HYDROmorphone 2 MG tablet Commonly known as:  DILAUDID Take 1-2 tablets (2-4 mg total) by mouth every 4 (four) hours as needed for moderate pain.   ICAPS AREDS 2 PO Take 2 capsules by mouth daily.   pioglitazone-metformin 15-850 MG tablet Commonly known as:  ACTOPLUS MET Take 1 tablet by mouth every evening.   ramipril 10 MG capsule Commonly known as:  ALTACE Take 20 mg by mouth every evening.   simvastatin 40 MG tablet Commonly known as:  ZOCOR Take 40 mg by mouth every evening.   traMADol 50 MG tablet Commonly known as:  ULTRAM Take 1-2 tablets (50-100 mg total) by mouth every  6 (six) hours as needed for moderate pain (unresponsive to hydromorphone). What changed:  reasons to take this   triamcinolone cream 0.1 % Commonly known as:  KENALOG Apply 1 application topically daily as needed for skin breakdown.   Vitamin D (Ergocalciferol) 50000 units Caps capsule Commonly known as:  DRISDOL Take 50,000 Units by mouth every 7 (seven) days.   Vitamin D2 2000 units Tabs Take 2,000 Units by mouth 2 (two) times daily.      Follow-up Information    Gaynelle Arabian, MD. Schedule an appointment as soon as possible for a visit on 05/25/2018.   Specialty:  Orthopedic Surgery Contact information: [address] [place] [address] [phone]           Signed: Ardeen Jourdain, PA-C Orthopaedic Surgery 05/17/2018, 7:15 AM

## 2018-05-17 NOTE — Progress Notes (Signed)
Report called to [facility] at [employer]. Bethann Punches RN

## 2018-05-17 NOTE — Telephone Encounter (Signed)
Rx faxed to [facility] (P) [phone], (F) [phone] 

## 2018-05-17 NOTE — Clinical Social Work Placement (Signed)
    9:59 AM Patient is going to [place].  LCSW confirmed bed. Patient will go to room 101.  Patient will transport by [facility].   LCSW faxed dc documents to facility.   Packet with scripts complete and on patient chart.   Family notified of transport.  RN report number [phone]  [facility]  CLINICAL SOCIAL WORK PLACEMENT  NOTE  Date:  05/17/2018  Patient Details  Name: Kelly [name] MRN: [id number] Date of Birth: [birth year]  Clinical Social Work is seeking post-discharge placement for this patient at the [place] level of care (*CSW will initial, date and re-position this form in  chart as items are completed):  Yes   Patient/family provided with [place] Work Department's list of facilities offering this level of care within the geographic area requested by the patient (or if unable, by the patient's family).  Yes   Patient/family informed of their freedom to choose among providers that offer the needed level of care, that participate in Medicare, Medicaid or managed care program needed by the patient, have an available bed and are willing to accept the patient.  Yes   Patient/family informed of [facility]'s ownership interest in [facility] and [facility], as well as of the fact that they are under no obligation to receive care at these facilities.  PASRR submitted to EDS on       PASRR number received on       Existing PASRR number confirmed on 05/13/18     FL2 transmitted to all facilities in geographic area requested by pt/family on 05/13/18     FL2 transmitted to all facilities within larger geographic area on 05/13/18     Patient informed that his/her managed care company has contracts with or will negotiate with certain facilities, including the following:        Yes   Patient/family informed of bed offers received.  Patient chooses bed at [place]     Physician recommends and patient  chooses bed at      Patient to be transferred to [facility] on 05/17/18.  Patient to be transferred to facility by EMS     Patient family notified on 05/17/18 of transfer.  Name of family member notified:  Chrissie Noa     PHYSICIAN       Additional Comment:    _______________________________________________ Servando Snare, LCSW 05/17/2018, 9:59 AM

## 2018-08-10 NOTE — Progress Notes (Signed)
03-16-18 (Epic) EKG  06-13-17 (Epic) ECHO

## 2018-08-10 NOTE — Patient Instructions (Addendum)
Kelly [name]  08/10/2018   Your procedure is scheduled on:  08-16-18   Report to [facility] Main  Entrance    Report to Admitting at 12:15 PM    Call this number if you have problems the morning of surgery [phone]   Remember: Do not eat food :After Midnight. You may have a Clear Liquid Diet from Midnight until 8:45 AM. After 8:45 AM, nothing by mouth including no water candy, gum,mints   CLEAR LIQUID DIET   Foods Allowed                                                                     Foods Excluded  Coffee and tea, regular and decaf                             liquids that you cannot  Plain Jell-O in any flavor                                             see through such as: Fruit ices (not with fruit pulp)                                     milk, soups, orange juice  Iced Popsicles                                    All solid food Carbonated beverages, regular and diet                                    Cranberry, grape and apple juices Sports drinks like Gatorade Lightly seasoned clear broth or consume(fat free) Sugar, honey syrup  Sample Menu Breakfast                                Lunch                                     Supper Cranberry juice                    Beef broth                            Chicken broth Jell-O                                     Grape juice  Apple juice Coffee or tea                        Jell-O                                      Popsicle                                                Coffee or tea                        Coffee or tea  _____________________________________________________________________    Take these medicines the morning of surgery with A SIP OF WATER:  Tylenol if needed   DO NOT TAKE ANY DIABETIC MEDICATIONS DAY OF YOUR SURGERY                               You may not have any metal on your body including hair pins and              piercings  Do not wear  jewelry, make-up, lotions, powders or perfumes, deodorant             Do not wear nail polish.  Do not shave  48 hours prior to surgery.                Do not bring valuables to the hospital. [place].  Contacts, dentures or bridgework may not be worn into surgery.  Leave suitcase in the car. After surgery it may be brought to your room.     :  Special Instructions: CHECK WITH DR. Scherrie November OFFICE ABOUT ELIQUIS              Please read over the following fact sheets you were given: _____________________________________________________________________   [facility] - Preparing for Surgery Before surgery, you can play an important role.  Because skin is not sterile, your skin needs to be as free of germs as possible.  You can reduce the number of germs on your skin by washing with CHG (chlorahexidine gluconate) soap before surgery.  CHG is an antiseptic cleaner which kills germs and bonds with the skin to continue killing germs even after washing. Please DO NOT use if you have an allergy to CHG or antibacterial soaps.  If your skin becomes reddened/irritated stop using the CHG and inform your nurse when you arrive at Short Stay. Do not shave (including legs and underarms) for at least 48 hours prior to the first CHG shower.  You may shave your face/neck. Please follow these instructions carefully:  1.  Shower with CHG Soap the night before surgery and the  morning of Surgery.  2.  If you choose to wash your hair, wash your hair first as usual with your  normal  shampoo.  3.  After you shampoo, rinse your hair and body thoroughly to remove the  shampoo.  4.  Use CHG as you would any other liquid soap.  You can apply chg directly  to the skin and wash                       Gently with a scrungie or clean washcloth.  5.  Apply the CHG Soap to your body ONLY FROM THE NECK DOWN.   Do not use on face/ open                            Wound or open sores. Avoid contact with eyes, ears mouth and genitals (private parts).                       Wash face,  Genitals (private parts) with your normal soap.             6.  Wash thoroughly, paying special attention to the area where your surgery  will be performed.  7.  Thoroughly rinse your body with warm water from the neck down.  8.  DO NOT shower/wash with your normal soap after using and rinsing off  the CHG Soap.             9.  Pat yourself dry with a clean towel.            10.  Wear clean pajamas.            11.  Place clean sheets on your bed the night of your first shower and do not  sleep with pets. Day of Surgery : Do not apply any lotions/deodorants the morning of surgery.  Please wear clean clothes to the hospital/surgery center.  FAILURE TO FOLLOW THESE INSTRUCTIONS MAY RESULT IN THE CANCELLATION OF YOUR SURGERY PATIENT SIGNATURE_________________________________  NURSE SIGNATURE__________________________________  ________________________________________________________________________   [name]  An incentive spirometer is a tool that can help keep your lungs clear and active. This tool measures how well you are filling your lungs with each breath. Taking long deep breaths may help reverse or decrease the chance of developing breathing (pulmonary) problems (especially infection) following:  A long period of time when you are unable to move or be active. BEFORE THE PROCEDURE   If the spirometer includes an indicator to show your best effort, your nurse or respiratory therapist will set it to a desired goal.  If possible, sit up straight or lean slightly forward. Try not to slouch.  Hold the incentive spirometer in an upright position. INSTRUCTIONS FOR USE  1. Sit on the edge of your bed if possible, or sit up as far as you can in bed or on a chair. 2. Hold the incentive spirometer in an upright position. 3. Breathe out  normally. 4. Place the mouthpiece in your mouth and seal your lips tightly around it. 5. Breathe in slowly and as deeply as possible, raising the piston or the ball toward the top of the column. 6. Hold your breath for 3-5 seconds or for as long as possible. Allow the piston or ball to fall to the bottom of the column. 7. Remove the mouthpiece from your mouth and breathe out normally. 8. Rest for a few seconds and repeat Steps 1 through 7 at least 10 times every 1-2 hours when you are awake. Take your time and take a few normal breaths between deep breaths. 9. The spirometer may include an indicator to show your best  effort. Use the indicator as a goal to work toward during each repetition. 10. After each set of 10 deep breaths, practice coughing to be sure your lungs are clear. If you have an incision (the cut made at the time of surgery), support your incision when coughing by placing a pillow or rolled up towels firmly against it. Once you are able to get out of bed, walk around indoors and cough well. You may stop using the incentive spirometer when instructed by your caregiver.  RISKS AND COMPLICATIONS  Take your time so you do not get dizzy or light-headed.  If you are in pain, you may need to take or ask for pain medication before doing incentive spirometry. It is harder to take a deep breath if you are having pain. AFTER USE  Rest and breathe slowly and easily.  It can be helpful to keep track of a log of your progress. Your caregiver can provide you with a simple table to help with this. If you are using the spirometer at home, follow these instructions: [place] IF:   You are having difficultly using the spirometer.  You have trouble using the spirometer as often as instructed.  Your pain medication is not giving enough relief while using the spirometer.  You develop fever of 100.5 F (38.1 C) or higher. SEEK IMMEDIATE MEDICAL CARE IF:   You cough up bloody sputum  that had not been present before.  You develop fever of 102 F (38.9 C) or greater.  You develop worsening pain at or near the incision site. MAKE SURE YOU:   Understand these instructions.  Will watch your condition.  Will get help right away if you are not doing well or get worse. Document Released: 04/06/2007 Document Revised: 02/16/2012 Document Reviewed: 06/07/2007 [facility] Patient Information 2014 [place], [place].   ________________________________________________________________________

## 2018-08-11 ENCOUNTER — Encounter (HOSPITAL_COMMUNITY)
Admission: RE | Admit: 2018-08-11 | Discharge: 2018-08-11 | Disposition: A | Discharge status: Home / Self Care | Payer: Worker's Compensation | Source: Ambulatory Visit | Attending: Orthopedic Surgery | Admitting: Orthopedic Surgery

## 2018-08-11 ENCOUNTER — Other Ambulatory Visit: Payer: Self-pay

## 2018-08-11 ENCOUNTER — Encounter (HOSPITAL_COMMUNITY): Payer: Self-pay

## 2018-08-11 DIAGNOSIS — Z01812 Encounter for preprocedural laboratory examination: Secondary | ICD-10-CM | POA: Insufficient documentation

## 2018-08-11 DIAGNOSIS — E119 Type 2 diabetes mellitus without complications: Secondary | ICD-10-CM | POA: Diagnosis not present

## 2018-08-11 DIAGNOSIS — G5731 Lesion of lateral popliteal nerve, right lower limb: Secondary | ICD-10-CM | POA: Insufficient documentation

## 2018-08-11 DIAGNOSIS — I4891 Unspecified atrial fibrillation: Secondary | ICD-10-CM | POA: Diagnosis not present

## 2018-08-11 DIAGNOSIS — I10 Essential (primary) hypertension: Secondary | ICD-10-CM | POA: Diagnosis not present

## 2018-08-11 HISTORY — DX: Long term (current) use of anticoagulants: Z79.01

## 2018-08-11 HISTORY — DX: Type 2 diabetes mellitus without complications: E11.9

## 2018-08-11 LAB — CBC
HEMATOCRIT: 36.2 % (ref 36.0–46.0)
HEMOGLOBIN: 11.4 g/dL — AB (ref 12.0–15.0)
MCH: 28.2 pg (ref 26.0–34.0)
MCHC: 31.5 g/dL (ref 30.0–36.0)
MCV: 89.6 fL (ref 78.0–100.0)
Platelets: 342 10*3/uL (ref 150–400)
RBC: 4.04 MIL/uL (ref 3.87–5.11)
RDW: 16.1 % — ABNORMAL HIGH (ref 11.5–15.5)
WBC: 8.2 10*3/uL (ref 4.0–10.5)

## 2018-08-11 LAB — BASIC METABOLIC PANEL
Anion gap: 10 (ref 5–15)
BUN: 12 mg/dL (ref 8–23)
CHLORIDE: 107 mmol/L (ref 98–111)
CO2: 27 mmol/L (ref 22–32)
Calcium: 9.8 mg/dL (ref 8.9–10.3)
Creatinine, Ser: 0.46 mg/dL (ref 0.44–1.00)
GFR calc Af Amer: >60 mL/min (ref >60)
GLUCOSE: 147 mg/dL — AB (ref 70–99)
POTASSIUM: 4.3 mmol/L (ref 3.5–5.1)
Sodium: 144 mmol/L (ref 135–145)

## 2018-08-11 LAB — HEMOGLOBIN A1C
Hgb A1c MFr Bld: 6.3 % — ABNORMAL HIGH (ref 4.8–5.6)
Mean Plasma Glucose: 134.11 mg/dL

## 2018-08-11 LAB — GLUCOSE, CAPILLARY: Glucose-Capillary: 156 mg/dL — ABNORMAL HIGH (ref 70–99)

## 2018-08-11 NOTE — Progress Notes (Addendum)
Called and lvm via phone for sherri, or scheduler for dr Wynelle Link.  Inquiring about pt clearance for surgery and for eliquis, have received any via fax as of yet.    ADDENDUM:  Received call back via phone from sherri, stated clearance requested via fax have not received back yet.  Also, stated that was was given instructions from office to stop eliquis 3 days prior to surgery.  ADDENDUM:  LVM for kelly , dr Wynelle Link or scheduler, checking to see if she has received clearance yet. Requested call back.  ADDENDUM:  Received clearance from dr Wynelle Link office via fax. This clearance is from pt pcp dated 08-10-2018 (placed in chart)and states that pt would instructions about her eliquis from her cardiologist.  Call and lvm for kelly, or scheduler for dr Wynelle Link.  ADDENDUM:  Received call from kelly, she stated the pt was told by her cardiologist to call the atrial fib clinic for instructions to stop eliquis and wendy said pt called her back and was instructions from atrial fib clinic to stop 3 days prior to surgery.

## 2018-08-16 ENCOUNTER — Encounter (HOSPITAL_COMMUNITY)
Admission: RE | Disposition: A | Discharge status: Home / Self Care | Payer: Self-pay | Source: Ambulatory Visit | Attending: Orthopedic Surgery

## 2018-08-16 ENCOUNTER — Encounter (HOSPITAL_COMMUNITY): Payer: Self-pay | Admitting: *Deleted

## 2018-08-16 ENCOUNTER — Other Ambulatory Visit: Payer: Self-pay

## 2018-08-16 ENCOUNTER — Ambulatory Visit (HOSPITAL_COMMUNITY): Payer: Worker's Compensation | Admitting: Anesthesiology

## 2018-08-16 ENCOUNTER — Observation Stay (HOSPITAL_COMMUNITY)
Admission: RE | Admit: 2018-08-16 | Discharge: 2018-08-17 | Disposition: A | Discharge status: Home / Self Care | Payer: Worker's Compensation | Source: Ambulatory Visit | Attending: Orthopedic Surgery | Admitting: Orthopedic Surgery

## 2018-08-16 DIAGNOSIS — I10 Essential (primary) hypertension: Secondary | ICD-10-CM | POA: Insufficient documentation

## 2018-08-16 DIAGNOSIS — Z6841 Body Mass Index (BMI) 40.0 and over, adult: Secondary | ICD-10-CM | POA: Insufficient documentation

## 2018-08-16 DIAGNOSIS — Z7901 Long term (current) use of anticoagulants: Secondary | ICD-10-CM | POA: Insufficient documentation

## 2018-08-16 DIAGNOSIS — Z888 Allergy status to other drugs, medicaments and biological substances status: Secondary | ICD-10-CM | POA: Insufficient documentation

## 2018-08-16 DIAGNOSIS — Z79899 Other long term (current) drug therapy: Secondary | ICD-10-CM | POA: Diagnosis not present

## 2018-08-16 DIAGNOSIS — Z7984 Long term (current) use of oral hypoglycemic drugs: Secondary | ICD-10-CM | POA: Insufficient documentation

## 2018-08-16 DIAGNOSIS — I4891 Unspecified atrial fibrillation: Secondary | ICD-10-CM | POA: Diagnosis not present

## 2018-08-16 DIAGNOSIS — E119 Type 2 diabetes mellitus without complications: Secondary | ICD-10-CM | POA: Diagnosis not present

## 2018-08-16 DIAGNOSIS — G5731 Lesion of lateral popliteal nerve, right lower limb: Principal | ICD-10-CM | POA: Insufficient documentation

## 2018-08-16 HISTORY — PX: SUPERFICIAL PERONEAL NERVE RELEASE: SHX6200

## 2018-08-16 LAB — GLUCOSE, CAPILLARY
GLUCOSE-CAPILLARY: 312 mg/dL — AB (ref 70–99)
Glucose-Capillary: 132 mg/dL — ABNORMAL HIGH (ref 70–99)
Glucose-Capillary: 95 mg/dL (ref 70–99)

## 2018-08-16 SURGERY — DECOMPRESSION, NERVE, SUPERFICIAL PERONEAL
Anesthesia: General | Laterality: Right

## 2018-08-16 MED ORDER — CEFAZOLIN SODIUM-DEXTROSE 2-4 GM/100ML-% IV SOLN
2.0000 g | INTRAVENOUS | Status: AC
  Administered 2018-08-16: 2 g via INTRAVENOUS
  Filled 2018-08-16: qty 100

## 2018-08-16 MED ORDER — SUGAMMADEX SODIUM 200 MG/2ML IV SOLN
INTRAVENOUS | Status: DC | PRN
  Administered 2018-08-16: 200 mg via INTRAVENOUS

## 2018-08-16 MED ORDER — LACTATED RINGERS IV SOLN
INTRAVENOUS | Status: DC
  Administered 2018-08-16 (×2): via INTRAVENOUS

## 2018-08-16 MED ORDER — HYDROMORPHONE HCL 1 MG/ML IJ SOLN
INTRAMUSCULAR | Status: AC
  Filled 2018-08-16: qty 1

## 2018-08-16 MED ORDER — FENTANYL CITRATE (PF) 100 MCG/2ML IJ SOLN
25.0000 ug | INTRAMUSCULAR | Status: DC | PRN
  Administered 2018-08-16 (×2): 50 ug via INTRAVENOUS

## 2018-08-16 MED ORDER — ROCURONIUM BROMIDE 10 MG/ML (PF) SYRINGE
PREFILLED_SYRINGE | INTRAVENOUS | Status: AC
  Filled 2018-08-16: qty 10

## 2018-08-16 MED ORDER — PROMETHAZINE HCL 25 MG/ML IJ SOLN: 6.2500 mg | INTRAMUSCULAR | Status: DC | PRN

## 2018-08-16 MED ORDER — ONDANSETRON HCL 4 MG/2ML IJ SOLN: 4.0000 mg | Freq: Four times a day (QID) | INTRAMUSCULAR | Status: DC | PRN

## 2018-08-16 MED ORDER — DOCUSATE SODIUM 100 MG PO CAPS
100.0000 mg | ORAL_CAPSULE | Freq: Two times a day (BID) | ORAL | Status: DC
  Administered 2018-08-16 – 2018-08-17 (×2): 100 mg via ORAL
  Filled 2018-08-16 (×2): qty 1

## 2018-08-16 MED ORDER — PHENYLEPHRINE 40 MCG/ML (10ML) SYRINGE FOR IV PUSH (FOR BLOOD PRESSURE SUPPORT)
PREFILLED_SYRINGE | INTRAVENOUS | Status: AC
  Filled 2018-08-16: qty 10

## 2018-08-16 MED ORDER — ONDANSETRON HCL 4 MG PO TABS: 4.0000 mg | ORAL_TABLET | Freq: Four times a day (QID) | ORAL | Status: DC | PRN

## 2018-08-16 MED ORDER — PROPOFOL 10 MG/ML IV BOLUS
INTRAVENOUS | Status: DC | PRN
  Administered 2018-08-16: 200 mg via INTRAVENOUS

## 2018-08-16 MED ORDER — DEXAMETHASONE SODIUM PHOSPHATE 10 MG/ML IJ SOLN
INTRAMUSCULAR | Status: AC
  Filled 2018-08-16: qty 1

## 2018-08-16 MED ORDER — ROCURONIUM BROMIDE 10 MG/ML (PF) SYRINGE
PREFILLED_SYRINGE | INTRAVENOUS | Status: DC | PRN
  Administered 2018-08-16: 30 mg via INTRAVENOUS

## 2018-08-16 MED ORDER — METOCLOPRAMIDE HCL 5 MG PO TABS: 5.0000 mg | ORAL_TABLET | Freq: Three times a day (TID) | ORAL | Status: DC | PRN

## 2018-08-16 MED ORDER — 0.9 % SODIUM CHLORIDE (POUR BTL) OPTIME
TOPICAL | Status: DC | PRN
  Administered 2018-08-16: 1000 mL

## 2018-08-16 MED ORDER — PROPOFOL 10 MG/ML IV BOLUS
INTRAVENOUS | Status: AC
  Filled 2018-08-16: qty 20

## 2018-08-16 MED ORDER — MORPHINE SULFATE (PF) 2 MG/ML IV SOLN
1.0000 mg | INTRAVENOUS | Status: DC | PRN
  Administered 2018-08-17: 1 mg via INTRAVENOUS
  Filled 2018-08-16: qty 1

## 2018-08-16 MED ORDER — LIDOCAINE 2% (20 MG/ML) 5 ML SYRINGE
INTRAMUSCULAR | Status: DC | PRN
  Administered 2018-08-16: 100 mg via INTRAVENOUS

## 2018-08-16 MED ORDER — INSULIN ASPART 100 UNIT/ML ~~LOC~~ SOLN
0.0000 [IU] | Freq: Three times a day (TID) | SUBCUTANEOUS | Status: DC
  Administered 2018-08-17: 2 [IU] via SUBCUTANEOUS

## 2018-08-16 MED ORDER — FENTANYL CITRATE (PF) 100 MCG/2ML IJ SOLN
INTRAMUSCULAR | Status: AC
  Filled 2018-08-16: qty 2

## 2018-08-16 MED ORDER — CYCLOBENZAPRINE HCL 10 MG PO TABS
10.0000 mg | ORAL_TABLET | Freq: Three times a day (TID) | ORAL | Status: DC | PRN
  Administered 2018-08-16: 10 mg via ORAL
  Filled 2018-08-16 (×2): qty 1

## 2018-08-16 MED ORDER — CEFAZOLIN SODIUM-DEXTROSE 2-4 GM/100ML-% IV SOLN
2.0000 g | Freq: Once | INTRAVENOUS | Status: AC
  Administered 2018-08-16: 2 g via INTRAVENOUS
  Filled 2018-08-16: qty 100

## 2018-08-16 MED ORDER — ONDANSETRON HCL 4 MG/2ML IJ SOLN
INTRAMUSCULAR | Status: AC
  Filled 2018-08-16: qty 2

## 2018-08-16 MED ORDER — LIDOCAINE 2% (20 MG/ML) 5 ML SYRINGE
INTRAMUSCULAR | Status: AC
  Filled 2018-08-16: qty 5

## 2018-08-16 MED ORDER — PHENYLEPHRINE 40 MCG/ML (10ML) SYRINGE FOR IV PUSH (FOR BLOOD PRESSURE SUPPORT)
PREFILLED_SYRINGE | INTRAVENOUS | Status: DC | PRN
  Administered 2018-08-16 (×3): 80 ug via INTRAVENOUS

## 2018-08-16 MED ORDER — DEXAMETHASONE SODIUM PHOSPHATE 10 MG/ML IJ SOLN
INTRAMUSCULAR | Status: DC | PRN
  Administered 2018-08-16: 10 mg via INTRAVENOUS

## 2018-08-16 MED ORDER — SIMVASTATIN 20 MG PO TABS
40.0000 mg | ORAL_TABLET | Freq: Every evening | ORAL | Status: DC
  Administered 2018-08-16: 40 mg via ORAL
  Filled 2018-08-16: qty 2

## 2018-08-16 MED ORDER — INSULIN ASPART 100 UNIT/ML ~~LOC~~ SOLN
11.0000 [IU] | Freq: Once | SUBCUTANEOUS | Status: AC
  Administered 2018-08-16: 11 [IU] via SUBCUTANEOUS

## 2018-08-16 MED ORDER — METOCLOPRAMIDE HCL 5 MG/ML IJ SOLN: 5.0000 mg | Freq: Three times a day (TID) | INTRAMUSCULAR | Status: DC | PRN

## 2018-08-16 MED ORDER — ONDANSETRON HCL 4 MG/2ML IJ SOLN
INTRAMUSCULAR | Status: DC | PRN
  Administered 2018-08-16: 4 mg via INTRAVENOUS

## 2018-08-16 MED ORDER — SUCCINYLCHOLINE CHLORIDE 200 MG/10ML IV SOSY
PREFILLED_SYRINGE | INTRAVENOUS | Status: AC
  Filled 2018-08-16: qty 10

## 2018-08-16 MED ORDER — ACETAMINOPHEN 10 MG/ML IV SOLN
1000.0000 mg | Freq: Four times a day (QID) | INTRAVENOUS | Status: DC
  Administered 2018-08-16: 1000 mg via INTRAVENOUS
  Filled 2018-08-16: qty 100

## 2018-08-16 MED ORDER — FENTANYL CITRATE (PF) 100 MCG/2ML IJ SOLN
INTRAMUSCULAR | Status: DC | PRN
  Administered 2018-08-16 (×2): 50 ug via INTRAVENOUS

## 2018-08-16 MED ORDER — APIXABAN 5 MG PO TABS
5.0000 mg | ORAL_TABLET | Freq: Two times a day (BID) | ORAL | Status: DC
  Administered 2018-08-17: 5 mg via ORAL
  Filled 2018-08-16: qty 1

## 2018-08-16 MED ORDER — HYDROCODONE-ACETAMINOPHEN 5-325 MG PO TABS
1.0000 | ORAL_TABLET | ORAL | Status: DC | PRN
  Administered 2018-08-16 – 2018-08-17 (×4): 2 via ORAL
  Filled 2018-08-16 (×4): qty 2

## 2018-08-16 MED ORDER — KETOROLAC TROMETHAMINE 30 MG/ML IJ SOLN
INTRAMUSCULAR | Status: AC
  Filled 2018-08-16: qty 1

## 2018-08-16 MED ORDER — CHLORHEXIDINE GLUCONATE 4 % EX LIQD: 60.0000 mL | Freq: Once | CUTANEOUS | Status: DC

## 2018-08-16 MED ORDER — LACTATED RINGERS IV SOLN: INTRAVENOUS | Status: DC

## 2018-08-16 MED ORDER — HYDROMORPHONE HCL 1 MG/ML IJ SOLN
0.2500 mg | INTRAMUSCULAR | Status: DC | PRN
  Administered 2018-08-16 (×4): 0.5 mg via INTRAVENOUS

## 2018-08-16 MED ORDER — SODIUM CHLORIDE 0.9 % IV SOLN
INTRAVENOUS | Status: DC
  Administered 2018-08-16: 19:00:00 via INTRAVENOUS

## 2018-08-16 MED ORDER — KETOROLAC TROMETHAMINE 30 MG/ML IJ SOLN
30.0000 mg | Freq: Once | INTRAMUSCULAR | Status: AC | PRN
  Administered 2018-08-16: 30 mg via INTRAVENOUS

## 2018-08-16 MED ORDER — SUGAMMADEX SODIUM 200 MG/2ML IV SOLN
INTRAVENOUS | Status: AC
  Filled 2018-08-16: qty 2

## 2018-08-16 MED ORDER — RAMIPRIL 10 MG PO CAPS
20.0000 mg | ORAL_CAPSULE | Freq: Every evening | ORAL | Status: DC
  Administered 2018-08-16: 20 mg via ORAL
  Filled 2018-08-16 (×2): qty 2

## 2018-08-16 MED ORDER — SUCCINYLCHOLINE CHLORIDE 200 MG/10ML IV SOSY
PREFILLED_SYRINGE | INTRAVENOUS | Status: DC | PRN
  Administered 2018-08-16: 100 mg via INTRAVENOUS

## 2018-08-16 SURGICAL SUPPLY — 43 items
BAG SPEC THK2 15X12 ZIP CLS (MISCELLANEOUS) ×1
BAG ZIPLOCK 12X15 (MISCELLANEOUS) ×2 IMPLANT
COVER SURGICAL LIGHT HANDLE (MISCELLANEOUS) ×2 IMPLANT
CUFF TOURN SGL QUICK 44 (TOURNIQUET CUFF) ×2 IMPLANT
DECANTER SPIKE VIAL GLASS SM (MISCELLANEOUS) IMPLANT
DRAPE ORTHO SPLIT 77X108 STRL (DRAPES) ×4
DRAPE POUCH INSTRU U-SHP 10X18 (DRAPES) ×2 IMPLANT
DRAPE SURG ORHT 6 SPLT 77X108 (DRAPES) ×2 IMPLANT
DRAPE U-SHAPE 47X51 STRL (DRAPES) ×2 IMPLANT
DRSG EMULSION OIL 3X3 NADH (GAUZE/BANDAGES/DRESSINGS) IMPLANT
DRSG MEPILEX BORDER 4X12 (GAUZE/BANDAGES/DRESSINGS) ×2 IMPLANT
DURAPREP 26ML APPLICATOR (WOUND CARE) ×2 IMPLANT
ELECT REM PT RETURN 15FT ADLT (MISCELLANEOUS) ×2 IMPLANT
GAUZE SPONGE 4X4 12PLY STRL (GAUZE/BANDAGES/DRESSINGS) IMPLANT
GLOVE BIO SURGEON STRL SZ7.5 (GLOVE) ×2 IMPLANT
GLOVE BIO SURGEON STRL SZ8 (GLOVE) ×4 IMPLANT
GLOVE BIOGEL PI IND STRL 7.0 (GLOVE) ×1 IMPLANT
GLOVE BIOGEL PI IND STRL 7.5 (GLOVE) ×1 IMPLANT
GLOVE BIOGEL PI IND STRL 8 (GLOVE) ×1 IMPLANT
GLOVE BIOGEL PI INDICATOR 7.0 (GLOVE) ×1
GLOVE BIOGEL PI INDICATOR 7.5 (GLOVE) ×1
GLOVE BIOGEL PI INDICATOR 8 (GLOVE) ×1
GLOVE SURG SS PI 7.0 STRL IVOR (GLOVE) ×4 IMPLANT
GOWN STRL REUS W/TWL LRG LVL3 (GOWN DISPOSABLE) ×4 IMPLANT
GOWN STRL REUS W/TWL XL LVL3 (GOWN DISPOSABLE) ×2 IMPLANT
KIT BASIN OR (CUSTOM PROCEDURE TRAY) ×2 IMPLANT
MANIFOLD NEPTUNE II (INSTRUMENTS) ×2 IMPLANT
NEEDLE HYPO 25X1 1.5 SAFETY (NEEDLE) IMPLANT
NS IRRIG 1000ML POUR BTL (IV SOLUTION) ×2 IMPLANT
PACK TOTAL JOINT (CUSTOM PROCEDURE TRAY) ×2 IMPLANT
PADDING CAST COTTON 6X4 STRL (CAST SUPPLIES) ×2 IMPLANT
POSITIONER SURGICAL ARM (MISCELLANEOUS) ×6 IMPLANT
STRIP CLOSURE SKIN 1/2X4 (GAUZE/BANDAGES/DRESSINGS) ×4 IMPLANT
SUCTION FRAZIER HANDLE 12FR (TUBING) ×1
SUCTION TUBE FRAZIER 12FR DISP (TUBING) ×1 IMPLANT
SUT MNCRL AB 4-0 PS2 18 (SUTURE) ×2 IMPLANT
SUT VIC AB 1 CT1 27 (SUTURE) ×6
SUT VIC AB 1 CT1 27XBRD ANTBC (SUTURE) ×3 IMPLANT
SUT VIC AB 2-0 CT1 27 (SUTURE) ×4
SUT VIC AB 2-0 CT1 TAPERPNT 27 (SUTURE) ×2 IMPLANT
SYR 20CC LL (SYRINGE) IMPLANT
TOWEL OR 17X26 10 PK STRL BLUE (TOWEL DISPOSABLE) ×4 IMPLANT
WATER STERILE IRR 1000ML POUR (IV SOLUTION) IMPLANT

## 2018-08-16 NOTE — Brief Op Note (Signed)
08/16/2018  3:23 PM  PATIENT:  Kelly [name]  72 y.o. female  PRE-OPERATIVE DIAGNOSIS:  right leg peroneal nerve palsy  POST-OPERATIVE DIAGNOSIS:  right leg peroneal nerve palsy  PROCEDURE:  Procedure(s) with comments: Right peroneal nerve decompression (Right) - 85min  SURGEON:  Surgeon(s) and Role:    Gaynelle Arabian, MD - Primary  PHYSICIAN ASSISTANT:   ASSISTANTS: Theresa Duty, PA-C   ANESTHESIA:   general  EBL:  20 mL   BLOOD ADMINISTERED:none  DRAINS: none   LOCAL MEDICATIONS USED:  NONE  COUNTS:  YES  TOURNIQUET:   Total Tourniquet Time Documented: Thigh (Right) - 1 minutes Total: Thigh (Right) - 1 minutes   DICTATION: .Other Dictation: Dictation Number [id number]  PLAN OF CARE: Admit for overnight observation  PATIENT DISPOSITION:  PACU - hemodynamically stable.

## 2018-08-16 NOTE — H&P (Signed)
CC- Kelly [name] is a 72 y.o. female who presents with right peroneal nerve palsy.  HPI- . Knee Pain: Patient presents with a peroneal nerve palsy involving the  right lower extremity. Onset of the symptoms was several months ago. Inciting event: She had a right total knee arthroplasty with correction of a large valgus deformity and has had inability to dorsiflex her foot and toes since. She has some sensation present in the peroneal nerve distribution. SHe had a recent nerve conduction study showing the compressive neuropathy invovling the peroneal nerve. She presents now for decompression.   Past Medical History:  Diagnosis Date  . A-fib [facility])    patient reports she went into AFIB after cataract surgery in July 2018; reports being asymptomatic   . Anticoagulant long-term use    eliquis  . Arthritis    left knee arthritis   . Hypertension   . Type 2 diabetes mellitus ([place])     Past Surgical History:  Procedure Laterality Date  . CATARACT EXTRACTION W/ INTRAOCULAR LENS  IMPLANT, BILATERAL  left 04/2017;  right 7/ 2018  . FEMUR IM NAIL Right 01/16/2015   Procedure: INTRAMEDULLARY (IM) RETROGRADE FEMORAL NAILING;  Surgeon: Gearlean Alf, MD;  Location: [facility];  Service: Orthopedics;  Laterality: Right;  . HARDWARE REMOVAL Right 03/24/2018   Procedure: HARDWARE REMOVAL right distal femur;  Surgeon: Gaynelle Arabian, MD;  Location: [facility];  Service: Orthopedics;  Laterality: Right;  . I&D EXTREMITY Right 08/15/2013   Procedure: INCISION AND DRAINAGE RIGHT HIP INFECTION (PREVIOUS RIGHT TOTAL HIP REPLACEMENT);  Surgeon: Gearlean Alf, MD;  Location: [facility];  Service: Orthopedics;  Laterality: Right;  . I&D EXTREMITY Right 09/12/2013   Procedure: IRRIGATION AND DEBRIDEMENT RIGHT  LATERAL THIGH;  Surgeon: Gearlean Alf, MD;  Location: [facility];  Service: Orthopedics;  Laterality: Right;  . INCISION AND DRAINAGE HIP Right 09/09/2013   Procedure: IRRIGATION AND DEBRIDEMENT HIP;  Surgeon: Augustin Schooling, MD;  Location: [facility];  Service: Orthopedics;  Laterality: Right;  . TOTAL HIP ARTHROPLASTY Right 07/07/2013   Procedure: RIGHT TOTAL HIP ARTHROPLASTY;  Surgeon: Gearlean Alf, MD;  Location: [facility];  Service: Orthopedics;  Laterality: Right;  . TOTAL KNEE ARTHROPLASTY Right 05/10/2018   Procedure: RIGHT TOTAL KNEE ARTHROPLASTY; femoral osteotomy;  Surgeon: Gaynelle Arabian, MD;  Location: [facility];  Service: Orthopedics;  Laterality: Right;  . TRANSTHORACIC ECHOCARDIOGRAM  06/23/2017   ef 60-65%, unable to evaluate LV diastolic function due to atrial fib./  mild TR and MR/  severe LAE    Prior to Admission medications   Medication Sig Start Date End Date Taking? Authorizing Provider  acetaminophen (TYLENOL) 500 MG tablet Take 1,000 mg by mouth every 6 (six) hours as needed for moderate pain.   Yes [provider]  apixaban (ELIQUIS) 5 MG TABS tablet Take 1 tablet (5 mg total) by mouth 2 (two) times daily. 06/11/17  Yes Sherran Needs, NP  Multiple Vitamins-Minerals (ICAPS AREDS 2 PO) Take 2 capsules by mouth daily.   Yes [provider]  pioglitazone-metformin (ACTOPLUS MET) 15-850 MG per tablet Take 1 tablet by mouth every evening.   Yes [provider]  ramipril (ALTACE) 10 MG capsule Take 20 mg by mouth every evening.   Yes [provider]  simvastatin (ZOCOR) 40 MG tablet Take 40 mg by mouth every evening.   Yes [provider]  triamcinolone cream (KENALOG) 0.1 % Apply 1 application topically daily as needed for skin  breakdown.   Yes [provider]  Vitamin D, Ergocalciferol, (DRISDOL) 50000 units CAPS capsule Take 50,000 Units by mouth every 7 (seven) days.   Yes [provider]    antalgic gait, Diminished sensation first dorsal web space right foot. EHL. TA function are out. Gastroc and FHL are intact.  Physical Examination: General appearance - alert, well appearing, and in no distress Mental status - alert, oriented  to person, place, and time Chest - clear to auscultation, no wheezes, rales or rhonchi, symmetric air entry Heart - normal rate, regular rhythm, normal S1, S2, no murmurs, rubs, clicks or gallops Abdomen - soft, nontender, nondistended, no masses or organomegaly Neurological - alert, oriented, normal speech, no focal findings or movement disorder noted   Asessment/Plan--- Right peroneal nerve palsy- - Plan right peroneal nerve decompression. Procedure risks and potential comps discussed with patient who elects to proceed. Goals are decreased pain and increased function with a high likelihood of achieving both

## 2018-08-16 NOTE — Anesthesia Procedure Notes (Signed)
Procedure Name: Intubation Date/Time: 08/16/2018 1:55 PM Performed by: Lind Covert, CRNA Pre-anesthesia Checklist: Patient identified, Emergency Drugs available, Suction available, Patient being monitored and Timeout performed Patient Re-evaluated:Patient Re-evaluated prior to induction Oxygen Delivery Method: Circle system utilized Preoxygenation: Pre-oxygenation with 100% oxygen Induction Type: IV induction Laryngoscope Size: 4 and Mac Grade View: Grade I Tube type: Oral Tube size: 7.0 mm Number of attempts: 1 Airway Equipment and Method: Stylet Placement Confirmation: ETT inserted through vocal cords under direct vision,  positive ETCO2 and breath sounds checked- equal and bilateral Secured at: 22 cm Tube secured with: Tape Dental Injury: Teeth and Oropharynx as per pre-operative assessment

## 2018-08-16 NOTE — Transfer of Care (Signed)
Immediate Anesthesia Transfer of Care Note  Patient: BEXLEIGH THERIAULT  Procedure(s) Performed: Right peroneal nerve decompression (Right )  Patient Location: PACU  Anesthesia Type:General  Level of Consciousness: awake, alert  and oriented  Airway & Oxygen Therapy: Patient Spontanous Breathing and Patient connected to face mask oxygen  Post-op Assessment: Report given to RN and Post -op Vital signs reviewed and stable  Post vital signs: Reviewed and stable  Last Vitals:  Vitals Value Taken Time  BP    Temp    Pulse 88 08/16/2018  3:38 PM  Resp    SpO2 100 % 08/16/2018  3:38 PM  Vitals shown include unvalidated device data.  Last Pain:  Vitals:   08/16/18 1201  TempSrc: Oral         Complications: No apparent anesthesia complications

## 2018-08-16 NOTE — Anesthesia Postprocedure Evaluation (Signed)
Anesthesia Post Note  Patient: Kelly [name]  Procedure([name]) Performed: Right peroneal nerve decompression (Right )     Patient location during evaluation: PACU Anesthesia Type: General Level of consciousness: awake and alert Pain management: pain level controlled Vital Signs Assessment: post-procedure vital signs reviewed and stable Respiratory status: spontaneous breathing, nonlabored ventilation, respiratory function stable and patient connected to nasal cannula oxygen Cardiovascular status: blood pressure returned to baseline and stable Postop Assessment: no apparent nausea or vomiting Anesthetic complications: no    Last Vitals:  Vitals:   08/16/18 1201 08/16/18 1545  BP: (!) 185/90 (!) 183/81  Pulse: 90   Resp: 18   Temp: 36.7 C 36.7 C  SpO2: 100%     Last Pain:  Vitals:   08/16/18 1545  TempSrc:   PainSc: 7                  Kelly [name]

## 2018-08-16 NOTE — Anesthesia Preprocedure Evaluation (Addendum)
Anesthesia Evaluation  Patient identified by MRN, date of birth, ID band Patient awake    Reviewed: Allergy & Precautions, NPO status , Patient's Chart, lab work & pertinent test results  Airway Mallampati: II  TM Distance: >3 FB Neck ROM: Full    Dental no notable dental hx.    Pulmonary neg pulmonary ROS,    Pulmonary exam normal breath sounds clear to auscultation       Cardiovascular hypertension, Pt. on medications + dysrhythmias Atrial Fibrillation  Rhythm:Irregular Rate:Normal     Neuro/Psych negative neurological ROS  negative psych ROS   GI/Hepatic negative GI ROS, Neg liver ROS,   Endo/Other  diabetesMorbid obesity  Renal/GU negative Renal ROS  negative genitourinary   Musculoskeletal negative musculoskeletal ROS (+)   Abdominal   Peds negative pediatric ROS (+)  Hematology negative hematology ROS (+)   Anesthesia Other Findings   Reproductive/Obstetrics negative OB ROS                            Anesthesia Physical Anesthesia Plan  ASA: III  Anesthesia Plan: General   Post-op Pain Management:    Induction: Intravenous  PONV Risk Score and Plan: 3 and Ondansetron, Dexamethasone and Treatment may vary due to age or medical condition  Airway Management Planned: LMA and Oral ETT  Additional Equipment:   Intra-op Plan:   Post-operative Plan: Extubation in OR  Informed Consent: I have reviewed the patients History and Physical, chart, labs and discussed the procedure including the risks, benefits and alternatives for the proposed anesthesia with the patient or authorized representative who has indicated his/her understanding and acceptance.   Dental advisory given  Plan Discussed with: CRNA and Surgeon  Anesthesia Plan Comments:         Anesthesia Quick Evaluation

## 2018-08-16 NOTE — Op Note (Signed)
NAME: Papin, Shaylah A. MEDICAL RECORD OP:[id number] ACCOUNT [id number] DATE OF BIRTH:[birth year] FACILITY: [facility] LOCATION: [facility] PHYSICIAN:Karmela Bram Zella Ball, MD  OPERATIVE REPORT  DATE OF PROCEDURE:  08/16/2018  PREOPERATIVE DIAGNOSIS:  Right peroneal nerve palsy.  POSTOPERATIVE DIAGNOSIS:  Right peroneal nerve palsy.  PROCEDURE:  Right peroneal nerve decompression.  SURGEON:  Gaynelle Arabian, MD  ASSISTANT:  Ladene Artist, PA-C  ANESTHESIA:  General.  ESTIMATED BLOOD LOSS:  20 mL.  DRAINS:  None.  TOURNIQUET TIME:  1 minute at 300 mmHg.  COMPLICATIONS:  None.  CONDITION:  Stable to recovery.  BRIEF CLINICAL NOTE:  The patient is a 72 year old female who underwent right total knee arthroplasty approximately 3 months ago with correction of a severe valgus deformity.  She has a postop peroneal nerve palsy.  Adequate time was given to see if  would start resolving.  When it was noted that it was not resolving, nerve conduction study was performed showing peroneal nerve compression.  She presents now for a peroneal nerve decompression.  DESCRIPTION OF PROCEDURE IN DETAIL:  After successful administration of general anesthetic, the patient was placed in the left lateral decubitus position with the right side up and held with a hip positioner.  Tourniquet was placed on the right thigh and  right lower extremity was isolated with plastic drapes and prepped and draped in the usual sterile fashion.  Extremity was wrapped in Esmarch and tourniquet inflated to 300 mmHg.  Incision was made posterolaterally to just beyond the fibular head and  proximally just posterior to the hamstring tendon.  Skin was cut with a 10 blade.  It was noted that the tourniquet was not working.  It was essentially a venous tourniquet.  The tourniquet was released and minor bleeding was stopped with cautery.  We  dissected through the tissue carefully down to the fascia lata.  Meticulous hemostasis was  achieved.  The incision was made in the fascia lata.  I did identify the nerve just posterior to the hamstring proximally.  I began dissection of the tissue  overlying the peroneal nerve.  We carried this proximally into the posterior thigh and then distally to the fibular neck.  It was at this point that there was significant compression on the nerve.  There was a band that was overlying the nerve and had  flattened the nerve out.  I very carefully dissected this band and incised it while protecting the nerve underneath with a Soil scientist.  I then carried the dissection out further down just beyond the fibular neck and into the musculature of the  lateral compartment.  The nerve was freed up in this area without any constriction.  The nerve was again inspected and that flattened area was the only area that appeared to have been compressed.  At this point, we thoroughly irrigated the wound bed and  then closed the fascia lata with interrupted #1 Vicryl.  I left it open more distally so as to prevent recompressing the nerve.  Subcutaneous was closed in two layers, deep subcutaneous with #1 Vicryl, superficial subcutaneous with 2-0 Vicryl.   Subcuticular was closed with a running 4-0 Monocryl.  Incisions cleaned and dried and Steri-Strips and sterile dressing applied.  She is awakened and transported to recovery in stable condition.  TN/NUANCE  D:08/16/2018 T:08/16/2018 JOB:[id number]

## 2018-08-17 ENCOUNTER — Encounter (HOSPITAL_COMMUNITY): Payer: Self-pay | Admitting: Orthopedic Surgery

## 2018-08-17 DIAGNOSIS — G5731 Lesion of lateral popliteal nerve, right lower limb: Secondary | ICD-10-CM | POA: Diagnosis not present

## 2018-08-17 LAB — GLUCOSE, CAPILLARY: Glucose-Capillary: 137 mg/dL — ABNORMAL HIGH (ref 70–99)

## 2018-08-17 MED ORDER — CYCLOBENZAPRINE HCL 10 MG PO TABS: 10.0000 mg | ORAL_TABLET | Freq: Three times a day (TID) | ORAL | 0 refills | Status: AC | PRN

## 2018-08-17 MED ORDER — HYDROCODONE-ACETAMINOPHEN 5-325 MG PO TABS: 1.0000 | ORAL_TABLET | Freq: Four times a day (QID) | ORAL | 0 refills | Status: AC | PRN

## 2018-08-17 NOTE — Discharge Instructions (Signed)
Dr. Gaynelle Arabian Total Joint Specialist [facility] [address] [place], [place] [address] [phone]  [place]   Remove items at home which could result in a fall. This includes throw rugs or furniture in walking pathways.   ICE to the affected knee every three hours for 30 minutes at a time and then as needed for pain and swelling.  Continue to use ice on the knee for pain and swelling from surgery. You may notice swelling that will progress down to the foot and ankle.  This is normal after surgery.  Elevate the leg when you are not up walking on it.    Continue to use the breathing machine which will help keep your temperature down.  It is common for your temperature to cycle up and down following surgery, especially at night when you are not up moving around and exerting yourself.  The breathing machine keeps your lungs expanded and your temperature down.  DIET You may resume your previous home diet once your are discharged from the hospital.  DRESSING / WOUND CARE / SHOWERING You may shower 3 days after surgery, but keep the wounds dry during showering.  You may use an occlusive plastic wrap (Press'n Seal for example), NO SOAKING/SUBMERGING IN THE BATHTUB.  If the bandage gets wet, change with a clean dry gauze.  If the incision gets wet, pat the wound dry with a clean towel. You may start showering once you are discharged home but do not submerge the incision under water. Just pat the incision dry and apply a dry gauze dressing on daily. Change the surgical dressing daily and reapply a dry dressing each time.  WEIGHT BEARING Weight bearing as tolerated with assist device (walker, cane, etc) as directed, use it as long as suggested by your surgeon or therapist, typically at least 4-6 weeks.  ITCHING  If you experience itching with your medications, try taking only a single pain pill, or even half a pain pill at a time.   You can also use Benadryl over the counter for itching or also to help with sleep.   MEDICATIONS See your medication summary on the After Visit Summary that the nursing staff will review with you prior to discharge.  You may have some home medications which will be placed on hold until you complete the course of blood thinner medication.  It is important for you to complete the blood thinner medication as prescribed by your surgeon.  Continue your approved medications as instructed at time of discharge.  PRECAUTIONS If you experience chest pain or shortness of breath - call 911 immediately for transfer to the hospital emergency department.  If you develop a fever greater that 101 F, purulent drainage from wound, increased redness or drainage from wound, foul odor from the wound/dressing, or calf pain - CONTACT YOUR SURGEON.                                                   FOLLOW-UP APPOINTMENTS Make sure you keep all of your appointments after your operation with your surgeon and caregivers. You should call the office at the above phone number and make an appointment for approximately two weeks after the date of your surgery or on the date instructed by your surgeon outlined in the "After Visit  Summary".  MAKE SURE YOU:   Understand these instructions.   Get help right away if you are not doing well or get worse.

## 2018-08-17 NOTE — Progress Notes (Signed)
   Subjective: 1 Day Post-Op Procedure(s) (LRB): Right peroneal nerve decompression (Right) Patient reports pain as mild.   Patient seen in rounds by Dr. Wynelle Link. Patient is well, and has had no acute complaints or problems. States she is ready to go home. Denies chest pain, calf pain or SOB.   Objective: Vital signs in last 24 hours: Temp:  [97.5 F (36.4 C)-98.2 F (36.8 C)] 97.5 F (36.4 C) (09/10 0500) Pulse Rate:  [62-98] 76 (09/10 0459) Resp:  [15-18] 15 (09/10 0459) BP: (130-185)/(59-104) 151/83 (09/10 0459) SpO2:  [91 %-100 %] 91 % (09/10 0459) Weight:  [117.9 kg] 117.9 kg (09/09 1214)  Intake/Output from previous day:  Intake/Output Summary (Last 24 hours) at 08/17/2018 0809 Last data filed at 08/17/2018 0459 Gross per 24 hour  Intake 3016.55 ml  Output 1870 ml  Net 1146.55 ml    Exam: General - Patient is Alert and Oriented Extremity - Neurologically intact Neurovascular intact Sensation intact distally Dorsiflexion/Plantar flexion intact Dressing - dressing C/D/I Motor Function - intact, moving foot and toes well on exam.   Past Medical History:  Diagnosis Date  . A-fib [facility])    patient reports she went into AFIB after cataract surgery in July 2018; reports being asymptomatic   . Anticoagulant long-term use    eliquis  . Arthritis    left knee arthritis   . Hypertension   . Type 2 diabetes mellitus (HCC)     Assessment/Plan: 1 Day Post-Op Procedure(s) (LRB): Right peroneal nerve decompression (Right) Active Problems:   Peroneal nerve palsy, right  Estimated body mass index is 47.54 kg/m as calculated from the following:   Height as of this encounter: 5\' 2"  (1.575 m).   Weight as of this encounter: 117.9 kg.  DVT Prophylaxis - Eliquis Weight bearing as tolerated.  Dorsiflexion and plantar flexion as well as distal sensation intact on physical exam. Plan for discharge to home today.  Theresa Duty, PA-C Orthopedic Surgery 08/17/2018, 8:09  AM

## 2018-08-20 NOTE — Discharge Summary (Signed)
Physician Discharge Summary   Patient ID: Kelly [name] MRN: [id number] DOB/AGE: Oct 26, 1946 72 y.o.  Admit date: 08/16/2018 Discharge date: 08/17/2018  Primary Diagnosis: Right peroneal nerve palsy   Admission Diagnoses:  Past Medical History:  Diagnosis Date  . A-fib [facility])    patient reports she went into AFIB after cataract surgery in July 2018; reports being asymptomatic   . Anticoagulant long-term use    eliquis  . Arthritis    left knee arthritis   . Hypertension   . Type 2 diabetes mellitus ([place])    Discharge Diagnoses:   Active Problems:   Peroneal nerve palsy, right  Estimated body mass index is 47.54 kg/m as calculated from the following:   Height as of this encounter: '5\' 2"'  (1.575 m).   Weight as of this encounter: 117.9 kg.  Procedure:  Procedure(s) (LRB): Right peroneal nerve decompression (Right)   Consults: None  HPI: The patient is a 72 year old female who underwent right total knee arthroplasty approximately 3 months ago with correction of a severe valgus deformity.  She has a postop peroneal nerve palsy.  Adequate time was given to see if would start resolving.  When it was noted that it was not resolving, nerve conduction study was performed showing peroneal nerve compression.  She presents now for a peroneal nerve decompression.  Laboratory Data: Admission on 08/16/2018, Discharged on 08/17/2018  Component Date Value Ref Range Status  . Glucose-Capillary 08/16/2018 132* 70 - 99 mg/dL Final  . Glucose-Capillary 08/16/2018 95  70 - 99 mg/dL Final  . Glucose-Capillary 08/16/2018 312* 70 - 99 mg/dL Final  . Glucose-Capillary 08/17/2018 137* 70 - 99 mg/dL Final  Hospital Outpatient Visit on 08/11/2018  Component Date Value Ref Range Status  . Glucose-Capillary 08/11/2018 156* 70 - 99 mg/dL Final  . WBC 08/11/2018 8.2  4.0 - 10.5 K/uL Final  . RBC 08/11/2018 4.04  3.87 - 5.11 MIL/uL Final  . Hemoglobin 08/11/2018 11.4* 12.0 - 15.0 g/dL Final  . HCT  08/11/2018 36.2  36.0 - 46.0 % Final  . MCV 08/11/2018 89.6  78.0 - 100.0 fL Final  . MCH 08/11/2018 28.2  26.0 - 34.0 pg Final  . MCHC 08/11/2018 31.5  30.0 - 36.0 g/dL Final  . RDW 08/11/2018 16.1* 11.5 - 15.5 % Final  . Platelets 08/11/2018 342  150 - 400 K/uL Final   Performed at [facility], [place] [address] [place], [place] [address]  . Sodium 08/11/2018 144  135 - 145 mmol/L Final  . Potassium 08/11/2018 4.3  3.5 - 5.1 mmol/L Final  . Chloride 08/11/2018 107  98 - 111 mmol/L Final  . CO2 08/11/2018 27  22 - 32 mmol/L Final  . Glucose, Bld 08/11/2018 147* 70 - 99 mg/dL Final  . BUN 08/11/2018 12  8 - 23 mg/dL Final  . Creatinine, Ser 08/11/2018 0.46  0.44 - 1.00 mg/dL Final  . Calcium 08/11/2018 9.8  8.9 - 10.3 mg/dL Final  . GFR calc non Af Amer 08/11/2018 >60  >60 mL/min Final  . GFR calc Af Amer 08/11/2018 >60  >60 mL/min Final   Comment: (NOTE) The eGFR has been calculated using the CKD EPI equation. This calculation has not been validated in all clinical situations. eGFR's persistently <60 mL/min signify possible Chronic Kidney Disease.   Georgiann Hahn gap 08/11/2018 10  5 - 15 Final   Performed at [facility], [place] [address], [place], [place] [address]  . Hgb A1c MFr  Bld 08/11/2018 6.3* 4.8 - 5.6 % Final   Comment: (NOTE) Pre diabetes:          5.7%-6.4% Diabetes:              >6.4% Glycemic control for   <7.0% adults with diabetes   . Mean Plasma Glucose 08/11/2018 134.11  mg/dL Final   Performed at [facility] [address] [place], [place] [address]     X-Rays:No results found.  EKG: Orders placed or performed during the hospital encounter of 03/16/18  . EKG 12 lead  . EKG 12 lead     Hospital Course: [name] is a 72 y.o. who was admitted to [facility]. They were brought to the operating room on 08/16/2018 and underwent Procedure(s): Right peroneal nerve decompression.  Patient tolerated the  procedure well and was later transferred to the recovery room and then to the orthopaedic floor for postoperative care. They were given PO and IV analgesics for pain control following their surgery. They were given 24 hours of postoperative antibiotics of  Anti-infectives (From admission, onward)   Start     Dose/Rate Route Frequency Ordered Stop   08/16/18 2200  ceFAZolin (ANCEF) IVPB 2g/100 mL premix     2 g 200 mL/hr over 30 Minutes Intravenous  Once 08/16/18 1747 08/16/18 2211   08/16/18 1200  ceFAZolin (ANCEF) IVPB 2g/100 mL premix     2 g 200 mL/hr over 30 Minutes Intravenous On call to O.R. 08/16/18 1155 08/16/18 1356     and started on DVT prophylaxis in the form of Eliquis. Patient had a good night on the evening of surgery. They started to get up OOB on POD #1. Pt was seen during rounds and was ready to go home. Plantar flexion and dorsiflexion were noted to already be improving. Pt had no issues during their overnight stay and was discharged to home on POD #1 in stable condition.   Diet: Diabetic diet Activity: WBAT Follow-up: in 2 weeks with Dr. Wynelle Link Disposition: Home with HEP Discharged Condition: stable   Discharge Instructions    Call MD / Call 911   Complete by:  As directed    If you experience chest pain or shortness of breath, CALL 911 and be transported to the hospital emergency room.  If you develope a fever above 101 F, pus (white drainage) or increased drainage or redness at the wound, or calf pain, call your surgeon's office.   Constipation Prevention   Complete by:  As directed    Drink plenty of fluids.  Prune juice may be helpful.  You may use a stool softener, such as Colace (over the counter) 100 mg twice a day.  Use MiraLax (over the counter) for constipation as needed.   Diet - low sodium heart healthy   Complete by:  As directed    Discharge instructions   Complete by:  As directed    Postoperative Constipation Protocol  Constipation - defined  medically as fewer than three stools per week and severe constipation as less than one stool per week.  One of the most common issues patients have following surgery is constipation.  Even if you have a regular bowel pattern at home, your normal regimen is likely to be disrupted due to multiple reasons following surgery.  Combination of anesthesia, postoperative narcotics, change in appetite and fluid intake all can affect your bowels.  In order to avoid complications following surgery, here are some recommendations in  order to help you during your recovery period.  Colace (docusate) - Pick up an over-the-counter form of Colace or another stool softener and take twice a day as long as you are requiring postoperative pain medications.  Take with a full glass of water daily.  If you experience loose stools or diarrhea, hold the colace until you stool forms back up.  If your symptoms do not get better within 1 week or if they get worse, check with your doctor.  Dulcolax (bisacodyl) - Pick up over-the-counter and take as directed by the product packaging as needed to assist with the movement of your bowels.  Take with a full glass of water.  Use this product as needed if not relieved by Colace only.   MiraLax (polyethylene glycol) - Pick up over-the-counter to have on hand.  MiraLax is a solution that will increase the amount of water in your bowels to assist with bowel movements.  Take as directed and can mix with a glass of water, juice, soda, coffee, or tea.  Take if you go more than two days without a movement. Do not use MiraLax more than once per day. Call your doctor if you are still constipated or irregular after using this medication for 7 days in a row.  If you continue to have problems with postoperative constipation, please contact the office for further assistance and recommendations.  If you experience "the worst abdominal pain ever" or develop nausea or vomiting, please contact the office  immediatly for further recommendations for treatment.   Driving restrictions   Complete by:  As directed    No driving for 2 weeks   Weight bearing as tolerated   Complete by:  As directed      Allergies as of 08/17/2018      Reactions   Tape Other (See Comments)   Tears skin   Robaxin [methocarbamol] Nausea And Vomiting      Medication List    TAKE these medications   acetaminophen 500 MG tablet Commonly known as:  TYLENOL Take 1,000 mg by mouth every 6 (six) hours as needed for moderate pain.   apixaban 5 MG Tabs tablet Commonly known as:  ELIQUIS Take 1 tablet (5 mg total) by mouth 2 (two) times daily.   cyclobenzaprine 10 MG tablet Commonly known as:  FLEXERIL Take 1 tablet (10 mg total) by mouth 3 (three) times daily as needed for muscle spasms.   HYDROcodone-acetaminophen 5-325 MG tablet Commonly known as:  NORCO/VICODIN Take 1-2 tablets by mouth every 6 (six) hours as needed for moderate pain.   ICAPS AREDS 2 PO Take 2 capsules by mouth daily.   pioglitazone-metformin 15-850 MG tablet Commonly known as:  ACTOPLUS MET Take 1 tablet by mouth every evening.   ramipril 10 MG capsule Commonly known as:  ALTACE Take 20 mg by mouth every evening.   simvastatin 40 MG tablet Commonly known as:  ZOCOR Take 40 mg by mouth every evening.   triamcinolone cream 0.1 % Commonly known as:  KENALOG Apply 1 application topically daily as needed for skin breakdown.   Vitamin D (Ergocalciferol) 50000 units Caps capsule Commonly known as:  DRISDOL Take 50,000 Units by mouth every 7 (seven) days.            Discharge Care Instructions  (From admission, onward)         Start     Ordered   08/17/18 0000  Weight bearing as tolerated     08/17/18 0818  Follow-up Information    Gaynelle Arabian, MD. Schedule an appointment as soon as possible for a visit on 08/31/2018.   Specialty:  Orthopedic Surgery Contact information: [address] [place]  [place] [address] [phone]           Signed: Theresa Duty, PA-C Orthopedic Surgery 08/20/2018, 8:34 AM

## 2018-09-27 DIAGNOSIS — Z23 Encounter for immunization: Secondary | ICD-10-CM | POA: Diagnosis not present

## 2018-09-27 DIAGNOSIS — E7849 Other hyperlipidemia: Secondary | ICD-10-CM | POA: Diagnosis not present

## 2018-09-27 DIAGNOSIS — E048 Other specified nontoxic goiter: Secondary | ICD-10-CM | POA: Diagnosis not present

## 2018-09-27 DIAGNOSIS — E559 Vitamin D deficiency, unspecified: Secondary | ICD-10-CM | POA: Diagnosis not present

## 2018-09-27 DIAGNOSIS — I480 Paroxysmal atrial fibrillation: Secondary | ICD-10-CM | POA: Diagnosis not present

## 2018-09-27 DIAGNOSIS — K8020 Calculus of gallbladder without cholecystitis without obstruction: Secondary | ICD-10-CM | POA: Diagnosis not present

## 2018-09-27 DIAGNOSIS — R740 Nonspecific elevation of levels of transaminase and lactic acid dehydrogenase [LDH]: Secondary | ICD-10-CM | POA: Diagnosis not present

## 2018-09-27 DIAGNOSIS — E1139 Type 2 diabetes mellitus with other diabetic ophthalmic complication: Secondary | ICD-10-CM | POA: Diagnosis not present

## 2018-09-27 DIAGNOSIS — I10 Essential (primary) hypertension: Secondary | ICD-10-CM | POA: Diagnosis not present

## 2018-09-27 DIAGNOSIS — M810 Age-related osteoporosis without current pathological fracture: Secondary | ICD-10-CM | POA: Diagnosis not present

## 2018-10-12 DIAGNOSIS — E113391 Type 2 diabetes mellitus with moderate nonproliferative diabetic retinopathy without macular edema, right eye: Secondary | ICD-10-CM | POA: Diagnosis not present

## 2018-10-12 DIAGNOSIS — H43811 Vitreous degeneration, right eye: Secondary | ICD-10-CM | POA: Diagnosis not present

## 2018-10-12 DIAGNOSIS — D3131 Benign neoplasm of right choroid: Secondary | ICD-10-CM | POA: Diagnosis not present

## 2018-10-12 DIAGNOSIS — E113592 Type 2 diabetes mellitus with proliferative diabetic retinopathy without macular edema, left eye: Secondary | ICD-10-CM | POA: Diagnosis not present

## 2018-10-13 DIAGNOSIS — Z6822 Body mass index (BMI) 22.0-22.9, adult: Secondary | ICD-10-CM | POA: Diagnosis not present

## 2018-10-13 DIAGNOSIS — M810 Age-related osteoporosis without current pathological fracture: Secondary | ICD-10-CM | POA: Diagnosis not present

## 2018-12-03 ENCOUNTER — Ambulatory Visit (HOSPITAL_COMMUNITY)
Admission: RE | Admit: 2018-12-03 | Discharge: 2018-12-03 | Disposition: A | Discharge status: Home / Self Care | Payer: PPO | Source: Ambulatory Visit | Attending: Endocrinology | Admitting: Endocrinology

## 2018-12-03 DIAGNOSIS — M810 Age-related osteoporosis without current pathological fracture: Secondary | ICD-10-CM | POA: Diagnosis not present

## 2018-12-03 MED ORDER — DENOSUMAB 60 MG/ML ~~LOC~~ SOSY
PREFILLED_SYRINGE | SUBCUTANEOUS | Status: AC
  Administered 2018-12-03: 60 mg via SUBCUTANEOUS
  Filled 2018-12-03: qty 1

## 2018-12-03 MED ORDER — DENOSUMAB 60 MG/ML ~~LOC~~ SOSY: 60.0000 mg | PREFILLED_SYRINGE | Freq: Once | SUBCUTANEOUS | Status: DC

## 2018-12-03 NOTE — Discharge Instructions (Signed)
Denosumab injection °What is this medicine? °DENOSUMAB (den oh sue mab) slows bone breakdown. Prolia is used to treat osteoporosis in women after menopause and in men, and in people who are taking corticosteroids for 6 months or more. Xgeva is used to treat a high calcium level due to cancer and to prevent bone fractures and other bone problems caused by multiple myeloma or cancer bone metastases. Xgeva is also used to treat giant cell tumor of the bone. °This medicine may be used for other purposes; ask your health care provider or pharmacist if you have questions. °COMMON BRAND NAME(S): Prolia, XGEVA °What should I tell my health care provider before I take this medicine? °They need to know if you have any of these conditions: °-dental disease °-having surgery or tooth extraction °-infection °-kidney disease °-low levels of calcium or Vitamin D in the blood °-malnutrition °-on hemodialysis °-skin conditions or sensitivity °-thyroid or parathyroid disease °-an unusual reaction to denosumab, other medicines, foods, dyes, or preservatives °-pregnant or trying to get pregnant °-breast-feeding °How should I use this medicine? °This medicine is for injection under the skin. It is given by a health care professional in a hospital or clinic setting. °A special MedGuide will be given to you before each treatment. Be sure to read this information carefully each time. °For Prolia, talk to your pediatrician regarding the use of this medicine in children. Special care may be needed. For Xgeva, talk to your pediatrician regarding the use of this medicine in children. While this drug may be prescribed for children as young as 13 years for selected conditions, precautions do apply. °Overdosage: If you think you have taken too much of this medicine contact a poison control center or emergency room at once. °NOTE: This medicine is only for you. Do not share this medicine with others. °What if I miss a dose? °It is important not to  miss your dose. Call your doctor or health care professional if you are unable to keep an appointment. °What may interact with this medicine? °Do not take this medicine with any of the following medications: °-other medicines containing denosumab °This medicine may also interact with the following medications: °-medicines that lower your chance of fighting infection °-steroid medicines like prednisone or cortisone °This list may not describe all possible interactions. Give your health care provider a list of all the medicines, herbs, non-prescription drugs, or dietary supplements you use. Also tell them if you smoke, drink alcohol, or use illegal drugs. Some items may interact with your medicine. °What should I watch for while using this medicine? °Visit your doctor or health care professional for regular checks on your progress. Your doctor or health care professional may order blood tests and other tests to see how you are doing. °Call your doctor or health care professional for advice if you get a fever, chills or sore throat, or other symptoms of a cold or flu. Do not treat yourself. This drug may decrease your body's ability to fight infection. Try to avoid being around people who are sick. °You should make sure you get enough calcium and vitamin D while you are taking this medicine, unless your doctor tells you not to. Discuss the foods you eat and the vitamins you take with your health care professional. °See your dentist regularly. Brush and floss your teeth as directed. Before you have any dental work done, tell your dentist you are receiving this medicine. °Do not become pregnant while taking this medicine or for 5 months   after stopping it. Talk with your doctor or health care professional about your birth control options while taking this medicine. Women should inform their doctor if they wish to become pregnant or think they might be pregnant. There is a potential for serious side effects to an unborn  child. Talk to your health care professional or pharmacist for more information. °What side effects may I notice from receiving this medicine? °Side effects that you should report to your doctor or health care professional as soon as possible: °-allergic reactions like skin rash, itching or hives, swelling of the face, lips, or tongue °-bone pain °-breathing problems °-dizziness °-jaw pain, especially after dental work °-redness, blistering, peeling of the skin °-signs and symptoms of infection like fever or chills; cough; sore throat; pain or trouble passing urine °-signs of low calcium like fast heartbeat, muscle cramps or muscle pain; pain, tingling, numbness in the hands or feet; seizures °-unusual bleeding or bruising °-unusually weak or tired °Side effects that usually do not require medical attention (report to your doctor or health care professional if they continue or are bothersome): °-constipation °-diarrhea °-headache °-joint pain °-loss of appetite °-muscle pain °-runny nose °-tiredness °-upset stomach °This list may not describe all possible side effects. Call your doctor for medical advice about side effects. You may report side effects to FDA at 1-800-FDA-1088. °Where should I keep my medicine? °This medicine is only given in a clinic, doctor's office, or other health care setting and will not be stored at home. °NOTE: This sheet is a summary. It may not cover all possible information. If you have questions about this medicine, talk to your doctor, pharmacist, or health care provider. °© 2019 [employer] (2018-04-02 16:10:44) ° °

## 2019-02-08 DIAGNOSIS — M6281 Muscle weakness (generalized): Secondary | ICD-10-CM | POA: Diagnosis not present

## 2019-02-08 DIAGNOSIS — M79604 Pain in right leg: Secondary | ICD-10-CM | POA: Diagnosis not present

## 2019-02-11 DIAGNOSIS — M79604 Pain in right leg: Secondary | ICD-10-CM | POA: Diagnosis not present

## 2019-02-17 DIAGNOSIS — M1712 Unilateral primary osteoarthritis, left knee: Secondary | ICD-10-CM | POA: Diagnosis not present

## 2019-02-24 DIAGNOSIS — M1712 Unilateral primary osteoarthritis, left knee: Secondary | ICD-10-CM | POA: Diagnosis not present

## 2019-03-14 DIAGNOSIS — Z96651 Presence of right artificial knee joint: Secondary | ICD-10-CM | POA: Diagnosis not present

## 2019-03-14 DIAGNOSIS — M25561 Pain in right knee: Secondary | ICD-10-CM | POA: Diagnosis not present

## 2019-03-14 DIAGNOSIS — M25562 Pain in left knee: Secondary | ICD-10-CM | POA: Diagnosis not present

## 2019-05-06 DIAGNOSIS — Z96651 Presence of right artificial knee joint: Secondary | ICD-10-CM | POA: Diagnosis not present

## 2019-05-06 DIAGNOSIS — G5731 Lesion of lateral popliteal nerve, right lower limb: Secondary | ICD-10-CM | POA: Diagnosis not present

## 2019-05-06 DIAGNOSIS — Z471 Aftercare following joint replacement surgery: Secondary | ICD-10-CM | POA: Diagnosis not present

## 2019-06-03 ENCOUNTER — Other Ambulatory Visit (HOSPITAL_COMMUNITY): Payer: Self-pay | Admitting: *Deleted

## 2019-06-06 ENCOUNTER — Other Ambulatory Visit: Payer: Self-pay

## 2019-06-06 ENCOUNTER — Ambulatory Visit (HOSPITAL_COMMUNITY)
Admission: RE | Admit: 2019-06-06 | Discharge: 2019-06-06 | Disposition: A | Discharge status: Home / Self Care | Payer: PPO | Source: Ambulatory Visit | Attending: Endocrinology | Admitting: Endocrinology

## 2019-06-06 DIAGNOSIS — M810 Age-related osteoporosis without current pathological fracture: Secondary | ICD-10-CM | POA: Insufficient documentation

## 2019-06-06 MED ORDER — DENOSUMAB 60 MG/ML ~~LOC~~ SOSY
PREFILLED_SYRINGE | SUBCUTANEOUS | Status: AC
  Filled 2019-06-06: qty 1

## 2019-06-06 MED ORDER — DENOSUMAB 60 MG/ML ~~LOC~~ SOSY
60.0000 mg | PREFILLED_SYRINGE | Freq: Once | SUBCUTANEOUS | Status: AC
  Administered 2019-06-06: 60 mg via SUBCUTANEOUS

## 2019-07-07 DIAGNOSIS — E1139 Type 2 diabetes mellitus with other diabetic ophthalmic complication: Secondary | ICD-10-CM | POA: Diagnosis not present

## 2019-07-07 DIAGNOSIS — M810 Age-related osteoporosis without current pathological fracture: Secondary | ICD-10-CM | POA: Diagnosis not present

## 2019-07-12 DIAGNOSIS — I10 Essential (primary) hypertension: Secondary | ICD-10-CM | POA: Diagnosis not present

## 2019-07-12 DIAGNOSIS — K8020 Calculus of gallbladder without cholecystitis without obstruction: Secondary | ICD-10-CM | POA: Diagnosis not present

## 2019-07-12 DIAGNOSIS — L409 Psoriasis, unspecified: Secondary | ICD-10-CM | POA: Diagnosis not present

## 2019-07-12 DIAGNOSIS — H3520 Other non-diabetic proliferative retinopathy, unspecified eye: Secondary | ICD-10-CM | POA: Diagnosis not present

## 2019-07-12 DIAGNOSIS — I480 Paroxysmal atrial fibrillation: Secondary | ICD-10-CM | POA: Diagnosis not present

## 2019-07-12 DIAGNOSIS — R740 Nonspecific elevation of levels of transaminase and lactic acid dehydrogenase [LDH]: Secondary | ICD-10-CM | POA: Diagnosis not present

## 2019-07-12 DIAGNOSIS — M810 Age-related osteoporosis without current pathological fracture: Secondary | ICD-10-CM | POA: Diagnosis not present

## 2019-07-12 DIAGNOSIS — E049 Nontoxic goiter, unspecified: Secondary | ICD-10-CM | POA: Diagnosis not present

## 2019-07-12 DIAGNOSIS — E785 Hyperlipidemia, unspecified: Secondary | ICD-10-CM | POA: Diagnosis not present

## 2019-07-12 DIAGNOSIS — D62 Acute posthemorrhagic anemia: Secondary | ICD-10-CM | POA: Diagnosis not present

## 2019-07-12 DIAGNOSIS — E1139 Type 2 diabetes mellitus with other diabetic ophthalmic complication: Secondary | ICD-10-CM | POA: Diagnosis not present

## 2019-07-27 DIAGNOSIS — E113391 Type 2 diabetes mellitus with moderate nonproliferative diabetic retinopathy without macular edema, right eye: Secondary | ICD-10-CM | POA: Diagnosis not present

## 2019-07-27 DIAGNOSIS — E113592 Type 2 diabetes mellitus with proliferative diabetic retinopathy without macular edema, left eye: Secondary | ICD-10-CM | POA: Diagnosis not present

## 2019-07-27 DIAGNOSIS — D3131 Benign neoplasm of right choroid: Secondary | ICD-10-CM | POA: Diagnosis not present

## 2019-07-27 DIAGNOSIS — H353132 Nonexudative age-related macular degeneration, bilateral, intermediate dry stage: Secondary | ICD-10-CM | POA: Diagnosis not present

## 2019-08-02 ENCOUNTER — Other Ambulatory Visit: Payer: Self-pay

## 2019-08-02 DIAGNOSIS — Z20822 Contact with and (suspected) exposure to covid-19: Secondary | ICD-10-CM

## 2019-08-03 LAB — NOVEL CORONAVIRUS, NAA: SARS-CoV-2, NAA: NOT DETECTED

## 2019-08-12 DIAGNOSIS — Z23 Encounter for immunization: Secondary | ICD-10-CM | POA: Diagnosis not present

## 2019-11-10 DIAGNOSIS — L409 Psoriasis, unspecified: Secondary | ICD-10-CM | POA: Diagnosis not present

## 2019-11-10 DIAGNOSIS — K8020 Calculus of gallbladder without cholecystitis without obstruction: Secondary | ICD-10-CM | POA: Diagnosis not present

## 2019-11-10 DIAGNOSIS — E559 Vitamin D deficiency, unspecified: Secondary | ICD-10-CM | POA: Diagnosis not present

## 2019-11-10 DIAGNOSIS — R7401 Elevation of levels of liver transaminase levels: Secondary | ICD-10-CM | POA: Diagnosis not present

## 2019-11-10 DIAGNOSIS — I480 Paroxysmal atrial fibrillation: Secondary | ICD-10-CM | POA: Diagnosis not present

## 2019-11-10 DIAGNOSIS — E049 Nontoxic goiter, unspecified: Secondary | ICD-10-CM | POA: Diagnosis not present

## 2019-11-10 DIAGNOSIS — E785 Hyperlipidemia, unspecified: Secondary | ICD-10-CM | POA: Diagnosis not present

## 2019-11-10 DIAGNOSIS — I10 Essential (primary) hypertension: Secondary | ICD-10-CM | POA: Diagnosis not present

## 2019-11-10 DIAGNOSIS — M810 Age-related osteoporosis without current pathological fracture: Secondary | ICD-10-CM | POA: Diagnosis not present

## 2019-11-10 DIAGNOSIS — H3520 Other non-diabetic proliferative retinopathy, unspecified eye: Secondary | ICD-10-CM | POA: Diagnosis not present

## 2019-11-10 DIAGNOSIS — E1139 Type 2 diabetes mellitus with other diabetic ophthalmic complication: Secondary | ICD-10-CM | POA: Diagnosis not present

## 2019-11-28 ENCOUNTER — Other Ambulatory Visit (HOSPITAL_COMMUNITY): Payer: Self-pay | Admitting: *Deleted

## 2019-11-29 ENCOUNTER — Ambulatory Visit (HOSPITAL_COMMUNITY)
Admission: RE | Admit: 2019-11-29 | Discharge: 2019-11-29 | Disposition: A | Discharge status: Home / Self Care | Payer: PPO | Source: Ambulatory Visit | Attending: Endocrinology | Admitting: Endocrinology

## 2019-11-29 ENCOUNTER — Other Ambulatory Visit: Payer: Self-pay

## 2019-11-29 DIAGNOSIS — M810 Age-related osteoporosis without current pathological fracture: Secondary | ICD-10-CM | POA: Insufficient documentation

## 2019-11-29 MED ORDER — DENOSUMAB 60 MG/ML ~~LOC~~ SOSY: 60.0000 mg | PREFILLED_SYRINGE | Freq: Once | SUBCUTANEOUS | Status: AC

## 2019-11-29 MED ORDER — DENOSUMAB 60 MG/ML ~~LOC~~ SOSY
PREFILLED_SYRINGE | SUBCUTANEOUS | Status: AC
  Administered 2019-11-29: 60 mg via SUBCUTANEOUS
  Filled 2019-11-29: qty 1

## 2019-12-15 DIAGNOSIS — M25562 Pain in left knee: Secondary | ICD-10-CM | POA: Diagnosis not present

## 2019-12-15 DIAGNOSIS — M1712 Unilateral primary osteoarthritis, left knee: Secondary | ICD-10-CM | POA: Diagnosis not present

## 2019-12-22 DIAGNOSIS — M25562 Pain in left knee: Secondary | ICD-10-CM | POA: Diagnosis not present

## 2019-12-22 DIAGNOSIS — M1712 Unilateral primary osteoarthritis, left knee: Secondary | ICD-10-CM | POA: Diagnosis not present

## 2019-12-29 DIAGNOSIS — M1712 Unilateral primary osteoarthritis, left knee: Secondary | ICD-10-CM | POA: Diagnosis not present

## 2019-12-29 DIAGNOSIS — M179 Osteoarthritis of knee, unspecified: Secondary | ICD-10-CM | POA: Diagnosis not present

## 2020-03-08 DIAGNOSIS — E1139 Type 2 diabetes mellitus with other diabetic ophthalmic complication: Secondary | ICD-10-CM | POA: Diagnosis not present

## 2020-03-08 DIAGNOSIS — M810 Age-related osteoporosis without current pathological fracture: Secondary | ICD-10-CM | POA: Diagnosis not present

## 2020-03-08 DIAGNOSIS — R7401 Elevation of levels of liver transaminase levels: Secondary | ICD-10-CM | POA: Diagnosis not present

## 2020-03-08 DIAGNOSIS — E049 Nontoxic goiter, unspecified: Secondary | ICD-10-CM | POA: Diagnosis not present

## 2020-03-08 DIAGNOSIS — E559 Vitamin D deficiency, unspecified: Secondary | ICD-10-CM | POA: Diagnosis not present

## 2020-03-08 DIAGNOSIS — H3520 Other non-diabetic proliferative retinopathy, unspecified eye: Secondary | ICD-10-CM | POA: Diagnosis not present

## 2020-03-08 DIAGNOSIS — I480 Paroxysmal atrial fibrillation: Secondary | ICD-10-CM | POA: Diagnosis not present

## 2020-03-08 DIAGNOSIS — K8020 Calculus of gallbladder without cholecystitis without obstruction: Secondary | ICD-10-CM | POA: Diagnosis not present

## 2020-03-08 DIAGNOSIS — L409 Psoriasis, unspecified: Secondary | ICD-10-CM | POA: Diagnosis not present

## 2020-03-15 DIAGNOSIS — E1139 Type 2 diabetes mellitus with other diabetic ophthalmic complication: Secondary | ICD-10-CM | POA: Diagnosis not present

## 2020-03-20 DIAGNOSIS — H43811 Vitreous degeneration, right eye: Secondary | ICD-10-CM | POA: Diagnosis not present

## 2020-03-20 DIAGNOSIS — E113391 Type 2 diabetes mellitus with moderate nonproliferative diabetic retinopathy without macular edema, right eye: Secondary | ICD-10-CM | POA: Diagnosis not present

## 2020-03-20 DIAGNOSIS — E113592 Type 2 diabetes mellitus with proliferative diabetic retinopathy without macular edema, left eye: Secondary | ICD-10-CM | POA: Diagnosis not present

## 2020-03-20 DIAGNOSIS — H353132 Nonexudative age-related macular degeneration, bilateral, intermediate dry stage: Secondary | ICD-10-CM | POA: Diagnosis not present

## 2020-05-14 ENCOUNTER — Other Ambulatory Visit (HOSPITAL_COMMUNITY): Payer: Self-pay | Admitting: *Deleted

## 2020-05-15 ENCOUNTER — Other Ambulatory Visit: Payer: Self-pay

## 2020-05-15 ENCOUNTER — Ambulatory Visit (HOSPITAL_COMMUNITY)
Admission: RE | Admit: 2020-05-15 | Discharge: 2020-05-15 | Disposition: A | Discharge status: Home / Self Care | Payer: PPO | Source: Ambulatory Visit | Attending: Endocrinology | Admitting: Endocrinology

## 2020-05-15 DIAGNOSIS — M810 Age-related osteoporosis without current pathological fracture: Secondary | ICD-10-CM | POA: Insufficient documentation

## 2020-05-15 MED ORDER — DENOSUMAB 60 MG/ML ~~LOC~~ SOSY
PREFILLED_SYRINGE | SUBCUTANEOUS | Status: AC
  Administered 2020-05-15: 60 mg via SUBCUTANEOUS
  Filled 2020-05-15: qty 1

## 2020-05-15 MED ORDER — DENOSUMAB 60 MG/ML ~~LOC~~ SOSY: 60.0000 mg | PREFILLED_SYRINGE | Freq: Once | SUBCUTANEOUS | Status: AC

## 2020-07-05 DIAGNOSIS — H3520 Other non-diabetic proliferative retinopathy, unspecified eye: Secondary | ICD-10-CM | POA: Diagnosis not present

## 2020-07-05 DIAGNOSIS — E1139 Type 2 diabetes mellitus with other diabetic ophthalmic complication: Secondary | ICD-10-CM | POA: Diagnosis not present

## 2020-07-05 DIAGNOSIS — R7401 Elevation of levels of liver transaminase levels: Secondary | ICD-10-CM | POA: Diagnosis not present

## 2020-07-05 DIAGNOSIS — R6889 Other general symptoms and signs: Secondary | ICD-10-CM | POA: Diagnosis not present

## 2020-07-05 DIAGNOSIS — I480 Paroxysmal atrial fibrillation: Secondary | ICD-10-CM | POA: Diagnosis not present

## 2020-08-15 DIAGNOSIS — Z7901 Long term (current) use of anticoagulants: Secondary | ICD-10-CM | POA: Diagnosis not present

## 2020-08-15 DIAGNOSIS — Z7689 Persons encountering health services in other specified circumstances: Secondary | ICD-10-CM | POA: Diagnosis not present

## 2020-08-15 DIAGNOSIS — E559 Vitamin D deficiency, unspecified: Secondary | ICD-10-CM | POA: Diagnosis not present

## 2020-08-15 DIAGNOSIS — T464X5A Adverse effect of angiotensin-converting-enzyme inhibitors, initial encounter: Secondary | ICD-10-CM | POA: Diagnosis not present

## 2020-08-15 DIAGNOSIS — M810 Age-related osteoporosis without current pathological fracture: Secondary | ICD-10-CM | POA: Diagnosis not present

## 2020-08-15 DIAGNOSIS — R600 Localized edema: Secondary | ICD-10-CM | POA: Diagnosis not present

## 2020-08-15 DIAGNOSIS — H3520 Other non-diabetic proliferative retinopathy, unspecified eye: Secondary | ICD-10-CM | POA: Diagnosis not present

## 2020-08-15 DIAGNOSIS — E049 Nontoxic goiter, unspecified: Secondary | ICD-10-CM | POA: Diagnosis not present

## 2020-08-15 DIAGNOSIS — E785 Hyperlipidemia, unspecified: Secondary | ICD-10-CM | POA: Diagnosis not present

## 2020-08-16 ENCOUNTER — Other Ambulatory Visit: Payer: Self-pay

## 2020-08-16 ENCOUNTER — Other Ambulatory Visit (HOSPITAL_COMMUNITY): Payer: Self-pay | Admitting: Endocrinology

## 2020-08-16 ENCOUNTER — Ambulatory Visit (HOSPITAL_COMMUNITY)
Admission: RE | Admit: 2020-08-16 | Discharge: 2020-08-16 | Disposition: A | Discharge status: Home / Self Care | Payer: PPO | Source: Ambulatory Visit | Attending: Vascular Surgery | Admitting: Vascular Surgery

## 2020-08-16 DIAGNOSIS — R600 Localized edema: Secondary | ICD-10-CM

## 2020-08-16 DIAGNOSIS — R52 Pain, unspecified: Secondary | ICD-10-CM

## 2020-09-14 DIAGNOSIS — E785 Hyperlipidemia, unspecified: Secondary | ICD-10-CM | POA: Diagnosis not present

## 2020-09-14 DIAGNOSIS — R600 Localized edema: Secondary | ICD-10-CM | POA: Diagnosis not present

## 2020-09-14 DIAGNOSIS — Z23 Encounter for immunization: Secondary | ICD-10-CM | POA: Diagnosis not present

## 2020-09-14 DIAGNOSIS — Z7901 Long term (current) use of anticoagulants: Secondary | ICD-10-CM | POA: Diagnosis not present

## 2020-09-14 DIAGNOSIS — I10 Essential (primary) hypertension: Secondary | ICD-10-CM | POA: Diagnosis not present

## 2020-09-14 DIAGNOSIS — D62 Acute posthemorrhagic anemia: Secondary | ICD-10-CM | POA: Diagnosis not present

## 2020-09-14 DIAGNOSIS — E049 Nontoxic goiter, unspecified: Secondary | ICD-10-CM | POA: Diagnosis not present

## 2020-09-14 DIAGNOSIS — I480 Paroxysmal atrial fibrillation: Secondary | ICD-10-CM | POA: Diagnosis not present

## 2020-09-14 DIAGNOSIS — H3520 Other non-diabetic proliferative retinopathy, unspecified eye: Secondary | ICD-10-CM | POA: Diagnosis not present

## 2020-09-14 DIAGNOSIS — M810 Age-related osteoporosis without current pathological fracture: Secondary | ICD-10-CM | POA: Diagnosis not present

## 2020-10-15 DIAGNOSIS — R600 Localized edema: Secondary | ICD-10-CM | POA: Diagnosis not present

## 2020-10-15 DIAGNOSIS — L97901 Non-pressure chronic ulcer of unspecified part of unspecified lower leg limited to breakdown of skin: Secondary | ICD-10-CM | POA: Diagnosis not present

## 2020-10-15 DIAGNOSIS — H3520 Other non-diabetic proliferative retinopathy, unspecified eye: Secondary | ICD-10-CM | POA: Diagnosis not present

## 2020-10-15 DIAGNOSIS — Z7901 Long term (current) use of anticoagulants: Secondary | ICD-10-CM | POA: Diagnosis not present

## 2020-10-15 DIAGNOSIS — E049 Nontoxic goiter, unspecified: Secondary | ICD-10-CM | POA: Diagnosis not present

## 2020-10-15 DIAGNOSIS — I10 Essential (primary) hypertension: Secondary | ICD-10-CM | POA: Diagnosis not present

## 2020-10-15 DIAGNOSIS — E785 Hyperlipidemia, unspecified: Secondary | ICD-10-CM | POA: Diagnosis not present

## 2020-10-15 DIAGNOSIS — D62 Acute posthemorrhagic anemia: Secondary | ICD-10-CM | POA: Diagnosis not present

## 2020-10-15 DIAGNOSIS — L409 Psoriasis, unspecified: Secondary | ICD-10-CM | POA: Diagnosis not present

## 2020-11-05 DIAGNOSIS — Z794 Long term (current) use of insulin: Secondary | ICD-10-CM | POA: Diagnosis not present

## 2020-11-05 DIAGNOSIS — I10 Essential (primary) hypertension: Secondary | ICD-10-CM | POA: Diagnosis not present

## 2020-11-05 DIAGNOSIS — M810 Age-related osteoporosis without current pathological fracture: Secondary | ICD-10-CM | POA: Diagnosis not present

## 2020-11-05 DIAGNOSIS — Z7901 Long term (current) use of anticoagulants: Secondary | ICD-10-CM | POA: Diagnosis not present

## 2020-11-05 DIAGNOSIS — E785 Hyperlipidemia, unspecified: Secondary | ICD-10-CM | POA: Diagnosis not present

## 2020-11-05 DIAGNOSIS — E559 Vitamin D deficiency, unspecified: Secondary | ICD-10-CM | POA: Diagnosis not present

## 2020-11-05 DIAGNOSIS — E049 Nontoxic goiter, unspecified: Secondary | ICD-10-CM | POA: Diagnosis not present

## 2020-11-05 DIAGNOSIS — K8020 Calculus of gallbladder without cholecystitis without obstruction: Secondary | ICD-10-CM | POA: Diagnosis not present

## 2020-11-05 DIAGNOSIS — E1142 Type 2 diabetes mellitus with diabetic polyneuropathy: Secondary | ICD-10-CM | POA: Diagnosis not present

## 2020-11-05 DIAGNOSIS — I480 Paroxysmal atrial fibrillation: Secondary | ICD-10-CM | POA: Diagnosis not present

## 2020-11-05 DIAGNOSIS — E113299 Type 2 diabetes mellitus with mild nonproliferative diabetic retinopathy without macular edema, unspecified eye: Secondary | ICD-10-CM | POA: Diagnosis not present

## 2020-11-09 ENCOUNTER — Other Ambulatory Visit (HOSPITAL_COMMUNITY): Payer: Self-pay | Admitting: *Deleted

## 2020-11-12 ENCOUNTER — Ambulatory Visit (HOSPITAL_COMMUNITY)
Admission: RE | Admit: 2020-11-12 | Discharge: 2020-11-12 | Disposition: A | Discharge status: Home / Self Care | Payer: PPO | Source: Ambulatory Visit | Attending: Endocrinology | Admitting: Endocrinology

## 2020-11-12 ENCOUNTER — Other Ambulatory Visit: Payer: Self-pay

## 2020-11-12 DIAGNOSIS — M818 Other osteoporosis without current pathological fracture: Secondary | ICD-10-CM | POA: Diagnosis not present

## 2020-11-12 MED ORDER — DENOSUMAB 60 MG/ML ~~LOC~~ SOSY
PREFILLED_SYRINGE | SUBCUTANEOUS | Status: AC
  Administered 2020-11-12: 60 mg via SUBCUTANEOUS
  Filled 2020-11-12: qty 1

## 2020-11-12 MED ORDER — DENOSUMAB 60 MG/ML ~~LOC~~ SOSY: 60.0000 mg | PREFILLED_SYRINGE | Freq: Once | SUBCUTANEOUS | Status: AC

## 2021-03-07 DIAGNOSIS — Z794 Long term (current) use of insulin: Secondary | ICD-10-CM | POA: Diagnosis not present

## 2021-03-07 DIAGNOSIS — E559 Vitamin D deficiency, unspecified: Secondary | ICD-10-CM | POA: Diagnosis not present

## 2021-03-07 DIAGNOSIS — E049 Nontoxic goiter, unspecified: Secondary | ICD-10-CM | POA: Diagnosis not present

## 2021-03-07 DIAGNOSIS — E1142 Type 2 diabetes mellitus with diabetic polyneuropathy: Secondary | ICD-10-CM | POA: Diagnosis not present

## 2021-03-07 DIAGNOSIS — E785 Hyperlipidemia, unspecified: Secondary | ICD-10-CM | POA: Diagnosis not present

## 2021-03-07 DIAGNOSIS — E113299 Type 2 diabetes mellitus with mild nonproliferative diabetic retinopathy without macular edema, unspecified eye: Secondary | ICD-10-CM | POA: Diagnosis not present

## 2021-03-07 DIAGNOSIS — M810 Age-related osteoporosis without current pathological fracture: Secondary | ICD-10-CM | POA: Diagnosis not present

## 2021-03-07 DIAGNOSIS — D62 Acute posthemorrhagic anemia: Secondary | ICD-10-CM | POA: Diagnosis not present

## 2021-03-07 DIAGNOSIS — I480 Paroxysmal atrial fibrillation: Secondary | ICD-10-CM | POA: Diagnosis not present

## 2021-03-07 DIAGNOSIS — Z7901 Long term (current) use of anticoagulants: Secondary | ICD-10-CM | POA: Diagnosis not present

## 2021-03-07 DIAGNOSIS — I10 Essential (primary) hypertension: Secondary | ICD-10-CM | POA: Diagnosis not present

## 2021-03-19 DIAGNOSIS — H524 Presbyopia: Secondary | ICD-10-CM | POA: Diagnosis not present

## 2021-03-19 DIAGNOSIS — E113391 Type 2 diabetes mellitus with moderate nonproliferative diabetic retinopathy without macular edema, right eye: Secondary | ICD-10-CM | POA: Diagnosis not present

## 2021-03-19 DIAGNOSIS — H40013 Open angle with borderline findings, low risk, bilateral: Secondary | ICD-10-CM | POA: Diagnosis not present

## 2021-03-19 DIAGNOSIS — E113592 Type 2 diabetes mellitus with proliferative diabetic retinopathy without macular edema, left eye: Secondary | ICD-10-CM | POA: Diagnosis not present

## 2021-04-09 DIAGNOSIS — H26492 Other secondary cataract, left eye: Secondary | ICD-10-CM | POA: Diagnosis not present

## 2021-05-16 ENCOUNTER — Other Ambulatory Visit (HOSPITAL_COMMUNITY): Payer: Self-pay | Admitting: *Deleted

## 2021-05-17 ENCOUNTER — Ambulatory Visit (HOSPITAL_COMMUNITY)
Admission: RE | Admit: 2021-05-17 | Discharge: 2021-05-17 | Disposition: A | Discharge status: Home / Self Care | Payer: PPO | Source: Ambulatory Visit | Attending: Endocrinology | Admitting: Endocrinology

## 2021-05-17 ENCOUNTER — Other Ambulatory Visit: Payer: Self-pay

## 2021-05-17 DIAGNOSIS — M810 Age-related osteoporosis without current pathological fracture: Secondary | ICD-10-CM | POA: Insufficient documentation

## 2021-05-17 MED ORDER — DENOSUMAB 60 MG/ML ~~LOC~~ SOSY: 60.0000 mg | PREFILLED_SYRINGE | Freq: Once | SUBCUTANEOUS | Status: AC

## 2021-05-17 MED ORDER — DENOSUMAB 60 MG/ML ~~LOC~~ SOSY
PREFILLED_SYRINGE | SUBCUTANEOUS | Status: AC
  Administered 2021-05-17: 60 mg via SUBCUTANEOUS
  Filled 2021-05-17: qty 1

## 2021-07-11 DIAGNOSIS — Z7901 Long term (current) use of anticoagulants: Secondary | ICD-10-CM | POA: Diagnosis not present

## 2021-07-11 DIAGNOSIS — E1142 Type 2 diabetes mellitus with diabetic polyneuropathy: Secondary | ICD-10-CM | POA: Diagnosis not present

## 2021-07-11 DIAGNOSIS — I480 Paroxysmal atrial fibrillation: Secondary | ICD-10-CM | POA: Diagnosis not present

## 2021-07-11 DIAGNOSIS — M810 Age-related osteoporosis without current pathological fracture: Secondary | ICD-10-CM | POA: Diagnosis not present

## 2021-07-11 DIAGNOSIS — L409 Psoriasis, unspecified: Secondary | ICD-10-CM | POA: Diagnosis not present

## 2021-07-11 DIAGNOSIS — Z794 Long term (current) use of insulin: Secondary | ICD-10-CM | POA: Diagnosis not present

## 2021-07-11 DIAGNOSIS — D62 Acute posthemorrhagic anemia: Secondary | ICD-10-CM | POA: Diagnosis not present

## 2021-07-11 DIAGNOSIS — I10 Essential (primary) hypertension: Secondary | ICD-10-CM | POA: Diagnosis not present

## 2021-07-11 DIAGNOSIS — E049 Nontoxic goiter, unspecified: Secondary | ICD-10-CM | POA: Diagnosis not present

## 2021-07-11 DIAGNOSIS — E113299 Type 2 diabetes mellitus with mild nonproliferative diabetic retinopathy without macular edema, unspecified eye: Secondary | ICD-10-CM | POA: Diagnosis not present

## 2021-07-11 DIAGNOSIS — K8020 Calculus of gallbladder without cholecystitis without obstruction: Secondary | ICD-10-CM | POA: Diagnosis not present

## 2021-08-27 DIAGNOSIS — Z1211 Encounter for screening for malignant neoplasm of colon: Secondary | ICD-10-CM | POA: Diagnosis not present

## 2021-08-27 DIAGNOSIS — Z1212 Encounter for screening for malignant neoplasm of rectum: Secondary | ICD-10-CM | POA: Diagnosis not present

## 2021-10-22 DIAGNOSIS — H353132 Nonexudative age-related macular degeneration, bilateral, intermediate dry stage: Secondary | ICD-10-CM | POA: Diagnosis not present

## 2021-10-22 DIAGNOSIS — E113393 Type 2 diabetes mellitus with moderate nonproliferative diabetic retinopathy without macular edema, bilateral: Secondary | ICD-10-CM | POA: Diagnosis not present

## 2021-10-22 DIAGNOSIS — H26491 Other secondary cataract, right eye: Secondary | ICD-10-CM | POA: Diagnosis not present

## 2021-11-11 DIAGNOSIS — E113299 Type 2 diabetes mellitus with mild nonproliferative diabetic retinopathy without macular edema, unspecified eye: Secondary | ICD-10-CM | POA: Diagnosis not present

## 2021-11-11 DIAGNOSIS — M810 Age-related osteoporosis without current pathological fracture: Secondary | ICD-10-CM | POA: Diagnosis not present

## 2021-11-11 DIAGNOSIS — I480 Paroxysmal atrial fibrillation: Secondary | ICD-10-CM | POA: Diagnosis not present

## 2021-11-11 DIAGNOSIS — E1142 Type 2 diabetes mellitus with diabetic polyneuropathy: Secondary | ICD-10-CM | POA: Diagnosis not present

## 2021-11-11 DIAGNOSIS — Z7901 Long term (current) use of anticoagulants: Secondary | ICD-10-CM | POA: Diagnosis not present

## 2021-11-11 DIAGNOSIS — E785 Hyperlipidemia, unspecified: Secondary | ICD-10-CM | POA: Diagnosis not present

## 2021-11-11 DIAGNOSIS — I10 Essential (primary) hypertension: Secondary | ICD-10-CM | POA: Diagnosis not present

## 2021-11-11 DIAGNOSIS — E049 Nontoxic goiter, unspecified: Secondary | ICD-10-CM | POA: Diagnosis not present

## 2021-11-11 DIAGNOSIS — E559 Vitamin D deficiency, unspecified: Secondary | ICD-10-CM | POA: Diagnosis not present

## 2021-11-11 DIAGNOSIS — Z794 Long term (current) use of insulin: Secondary | ICD-10-CM | POA: Diagnosis not present

## 2021-11-11 DIAGNOSIS — Z23 Encounter for immunization: Secondary | ICD-10-CM | POA: Diagnosis not present

## 2021-12-05 ENCOUNTER — Other Ambulatory Visit (HOSPITAL_COMMUNITY): Payer: Self-pay | Admitting: *Deleted

## 2021-12-06 ENCOUNTER — Other Ambulatory Visit: Payer: Self-pay

## 2021-12-06 ENCOUNTER — Ambulatory Visit (HOSPITAL_COMMUNITY)
Admission: RE | Admit: 2021-12-06 | Discharge: 2021-12-06 | Disposition: A | Discharge status: Home / Self Care | Payer: PPO | Source: Ambulatory Visit | Attending: Endocrinology | Admitting: Endocrinology

## 2021-12-06 DIAGNOSIS — M810 Age-related osteoporosis without current pathological fracture: Secondary | ICD-10-CM | POA: Diagnosis not present

## 2021-12-06 MED ORDER — DENOSUMAB 60 MG/ML ~~LOC~~ SOSY: 60.0000 mg | PREFILLED_SYRINGE | Freq: Once | SUBCUTANEOUS | Status: AC

## 2021-12-06 MED ORDER — DENOSUMAB 60 MG/ML ~~LOC~~ SOSY
PREFILLED_SYRINGE | SUBCUTANEOUS | Status: AC
  Administered 2021-12-06: 14:00:00 60 mg via SUBCUTANEOUS
  Filled 2021-12-06: qty 1

## 2022-02-20 DIAGNOSIS — M25562 Pain in left knee: Secondary | ICD-10-CM | POA: Diagnosis not present

## 2022-02-20 DIAGNOSIS — M1712 Unilateral primary osteoarthritis, left knee: Secondary | ICD-10-CM | POA: Diagnosis not present

## 2022-02-27 DIAGNOSIS — M1712 Unilateral primary osteoarthritis, left knee: Secondary | ICD-10-CM | POA: Diagnosis not present

## 2022-03-06 DIAGNOSIS — M1712 Unilateral primary osteoarthritis, left knee: Secondary | ICD-10-CM | POA: Diagnosis not present

## 2022-03-12 DIAGNOSIS — E785 Hyperlipidemia, unspecified: Secondary | ICD-10-CM | POA: Diagnosis not present

## 2022-03-12 DIAGNOSIS — I10 Essential (primary) hypertension: Secondary | ICD-10-CM | POA: Diagnosis not present

## 2022-03-12 DIAGNOSIS — E559 Vitamin D deficiency, unspecified: Secondary | ICD-10-CM | POA: Diagnosis not present

## 2022-03-12 DIAGNOSIS — E049 Nontoxic goiter, unspecified: Secondary | ICD-10-CM | POA: Diagnosis not present

## 2022-03-12 DIAGNOSIS — E1142 Type 2 diabetes mellitus with diabetic polyneuropathy: Secondary | ICD-10-CM | POA: Diagnosis not present

## 2022-03-19 DIAGNOSIS — E113299 Type 2 diabetes mellitus with mild nonproliferative diabetic retinopathy without macular edema, unspecified eye: Secondary | ICD-10-CM | POA: Diagnosis not present

## 2022-03-19 DIAGNOSIS — Z1331 Encounter for screening for depression: Secondary | ICD-10-CM | POA: Diagnosis not present

## 2022-03-19 DIAGNOSIS — I10 Essential (primary) hypertension: Secondary | ICD-10-CM | POA: Diagnosis not present

## 2022-03-19 DIAGNOSIS — R82998 Other abnormal findings in urine: Secondary | ICD-10-CM | POA: Diagnosis not present

## 2022-03-19 DIAGNOSIS — M810 Age-related osteoporosis without current pathological fracture: Secondary | ICD-10-CM | POA: Diagnosis not present

## 2022-03-19 DIAGNOSIS — I480 Paroxysmal atrial fibrillation: Secondary | ICD-10-CM | POA: Diagnosis not present

## 2022-03-19 DIAGNOSIS — Z0000 Encounter for general adult medical examination without abnormal findings: Secondary | ICD-10-CM | POA: Diagnosis not present

## 2022-03-19 DIAGNOSIS — E785 Hyperlipidemia, unspecified: Secondary | ICD-10-CM | POA: Diagnosis not present

## 2022-03-19 DIAGNOSIS — Z7901 Long term (current) use of anticoagulants: Secondary | ICD-10-CM | POA: Diagnosis not present

## 2022-03-19 DIAGNOSIS — E1142 Type 2 diabetes mellitus with diabetic polyneuropathy: Secondary | ICD-10-CM | POA: Diagnosis not present

## 2022-03-19 DIAGNOSIS — E049 Nontoxic goiter, unspecified: Secondary | ICD-10-CM | POA: Diagnosis not present

## 2022-03-19 DIAGNOSIS — Z1339 Encounter for screening examination for other mental health and behavioral disorders: Secondary | ICD-10-CM | POA: Diagnosis not present

## 2022-04-23 DIAGNOSIS — E113393 Type 2 diabetes mellitus with moderate nonproliferative diabetic retinopathy without macular edema, bilateral: Secondary | ICD-10-CM | POA: Diagnosis not present

## 2022-04-23 DIAGNOSIS — H353132 Nonexudative age-related macular degeneration, bilateral, intermediate dry stage: Secondary | ICD-10-CM | POA: Diagnosis not present

## 2022-04-23 DIAGNOSIS — H04123 Dry eye syndrome of bilateral lacrimal glands: Secondary | ICD-10-CM | POA: Diagnosis not present

## 2022-04-23 DIAGNOSIS — H26491 Other secondary cataract, right eye: Secondary | ICD-10-CM | POA: Diagnosis not present

## 2022-06-03 ENCOUNTER — Other Ambulatory Visit (HOSPITAL_COMMUNITY): Payer: Self-pay | Admitting: *Deleted

## 2022-06-04 ENCOUNTER — Ambulatory Visit (HOSPITAL_COMMUNITY)
Admission: RE | Admit: 2022-06-04 | Discharge: 2022-06-04 | Disposition: A | Discharge status: Home / Self Care | Payer: PPO | Source: Ambulatory Visit | Attending: Endocrinology | Admitting: Endocrinology

## 2022-06-04 DIAGNOSIS — M810 Age-related osteoporosis without current pathological fracture: Secondary | ICD-10-CM | POA: Insufficient documentation

## 2022-06-04 MED ORDER — DENOSUMAB 60 MG/ML ~~LOC~~ SOSY
60.0000 mg | PREFILLED_SYRINGE | Freq: Once | SUBCUTANEOUS | Status: AC
  Administered 2022-06-04: 60 mg via SUBCUTANEOUS

## 2022-06-04 MED ORDER — DENOSUMAB 60 MG/ML ~~LOC~~ SOSY
PREFILLED_SYRINGE | SUBCUTANEOUS | Status: AC
  Filled 2022-06-04: qty 1

## 2022-08-01 DIAGNOSIS — I480 Paroxysmal atrial fibrillation: Secondary | ICD-10-CM | POA: Diagnosis not present

## 2022-08-01 DIAGNOSIS — Z7901 Long term (current) use of anticoagulants: Secondary | ICD-10-CM | POA: Diagnosis not present

## 2022-08-01 DIAGNOSIS — Z794 Long term (current) use of insulin: Secondary | ICD-10-CM | POA: Diagnosis not present

## 2022-08-01 DIAGNOSIS — E1142 Type 2 diabetes mellitus with diabetic polyneuropathy: Secondary | ICD-10-CM | POA: Diagnosis not present

## 2022-08-01 DIAGNOSIS — E785 Hyperlipidemia, unspecified: Secondary | ICD-10-CM | POA: Diagnosis not present

## 2022-08-01 DIAGNOSIS — I10 Essential (primary) hypertension: Secondary | ICD-10-CM | POA: Diagnosis not present

## 2022-08-01 DIAGNOSIS — M810 Age-related osteoporosis without current pathological fracture: Secondary | ICD-10-CM | POA: Diagnosis not present

## 2022-08-01 DIAGNOSIS — E113299 Type 2 diabetes mellitus with mild nonproliferative diabetic retinopathy without macular edema, unspecified eye: Secondary | ICD-10-CM | POA: Diagnosis not present

## 2022-11-17 DIAGNOSIS — M1712 Unilateral primary osteoarthritis, left knee: Secondary | ICD-10-CM | POA: Diagnosis not present

## 2022-11-19 DIAGNOSIS — E113299 Type 2 diabetes mellitus with mild nonproliferative diabetic retinopathy without macular edema, unspecified eye: Secondary | ICD-10-CM | POA: Diagnosis not present

## 2022-11-19 DIAGNOSIS — I10 Essential (primary) hypertension: Secondary | ICD-10-CM | POA: Diagnosis not present

## 2022-11-19 DIAGNOSIS — Z794 Long term (current) use of insulin: Secondary | ICD-10-CM | POA: Diagnosis not present

## 2022-11-19 DIAGNOSIS — Z7901 Long term (current) use of anticoagulants: Secondary | ICD-10-CM | POA: Diagnosis not present

## 2022-11-19 DIAGNOSIS — L409 Psoriasis, unspecified: Secondary | ICD-10-CM | POA: Diagnosis not present

## 2022-11-19 DIAGNOSIS — Z23 Encounter for immunization: Secondary | ICD-10-CM | POA: Diagnosis not present

## 2022-11-19 DIAGNOSIS — E1142 Type 2 diabetes mellitus with diabetic polyneuropathy: Secondary | ICD-10-CM | POA: Diagnosis not present

## 2022-11-19 DIAGNOSIS — M810 Age-related osteoporosis without current pathological fracture: Secondary | ICD-10-CM | POA: Diagnosis not present

## 2022-11-19 DIAGNOSIS — E785 Hyperlipidemia, unspecified: Secondary | ICD-10-CM | POA: Diagnosis not present

## 2022-11-19 DIAGNOSIS — E049 Nontoxic goiter, unspecified: Secondary | ICD-10-CM | POA: Diagnosis not present

## 2022-11-19 DIAGNOSIS — I480 Paroxysmal atrial fibrillation: Secondary | ICD-10-CM | POA: Diagnosis not present

## 2022-11-24 DIAGNOSIS — M1712 Unilateral primary osteoarthritis, left knee: Secondary | ICD-10-CM | POA: Diagnosis not present

## 2022-12-02 DIAGNOSIS — M1712 Unilateral primary osteoarthritis, left knee: Secondary | ICD-10-CM | POA: Diagnosis not present

## 2022-12-03 ENCOUNTER — Other Ambulatory Visit (HOSPITAL_COMMUNITY): Payer: Self-pay

## 2022-12-05 ENCOUNTER — Ambulatory Visit (HOSPITAL_COMMUNITY)
Admission: RE | Admit: 2022-12-05 | Discharge: 2022-12-05 | Disposition: A | Discharge status: Home / Self Care | Payer: PPO | Source: Ambulatory Visit | Attending: Endocrinology | Admitting: Endocrinology

## 2022-12-05 DIAGNOSIS — M810 Age-related osteoporosis without current pathological fracture: Secondary | ICD-10-CM | POA: Diagnosis not present

## 2022-12-05 MED ORDER — DENOSUMAB 60 MG/ML ~~LOC~~ SOSY
60.0000 mg | PREFILLED_SYRINGE | Freq: Once | SUBCUTANEOUS | Status: AC
  Administered 2022-12-05: 60 mg via SUBCUTANEOUS

## 2022-12-05 MED ORDER — DENOSUMAB 60 MG/ML ~~LOC~~ SOSY
PREFILLED_SYRINGE | SUBCUTANEOUS | Status: AC
  Filled 2022-12-05: qty 1

## 2023-01-26 DIAGNOSIS — H52203 Unspecified astigmatism, bilateral: Secondary | ICD-10-CM | POA: Diagnosis not present

## 2023-01-26 DIAGNOSIS — H353133 Nonexudative age-related macular degeneration, bilateral, advanced atrophic without subfoveal involvement: Secondary | ICD-10-CM | POA: Diagnosis not present

## 2023-01-26 DIAGNOSIS — E113393 Type 2 diabetes mellitus with moderate nonproliferative diabetic retinopathy without macular edema, bilateral: Secondary | ICD-10-CM | POA: Diagnosis not present

## 2023-01-26 DIAGNOSIS — H40013 Open angle with borderline findings, low risk, bilateral: Secondary | ICD-10-CM | POA: Diagnosis not present

## 2023-01-26 DIAGNOSIS — H26491 Other secondary cataract, right eye: Secondary | ICD-10-CM | POA: Diagnosis not present

## 2023-02-03 DIAGNOSIS — H26491 Other secondary cataract, right eye: Secondary | ICD-10-CM | POA: Diagnosis not present

## 2023-03-09 DIAGNOSIS — Z96651 Presence of right artificial knee joint: Secondary | ICD-10-CM | POA: Diagnosis not present

## 2023-03-16 DIAGNOSIS — E785 Hyperlipidemia, unspecified: Secondary | ICD-10-CM | POA: Diagnosis not present

## 2023-03-16 DIAGNOSIS — E1142 Type 2 diabetes mellitus with diabetic polyneuropathy: Secondary | ICD-10-CM | POA: Diagnosis not present

## 2023-03-16 DIAGNOSIS — I10 Essential (primary) hypertension: Secondary | ICD-10-CM | POA: Diagnosis not present

## 2023-03-16 DIAGNOSIS — E559 Vitamin D deficiency, unspecified: Secondary | ICD-10-CM | POA: Diagnosis not present

## 2023-03-16 DIAGNOSIS — E049 Nontoxic goiter, unspecified: Secondary | ICD-10-CM | POA: Diagnosis not present

## 2023-03-23 DIAGNOSIS — I480 Paroxysmal atrial fibrillation: Secondary | ICD-10-CM | POA: Diagnosis not present

## 2023-03-23 DIAGNOSIS — I10 Essential (primary) hypertension: Secondary | ICD-10-CM | POA: Diagnosis not present

## 2023-03-23 DIAGNOSIS — Z1339 Encounter for screening examination for other mental health and behavioral disorders: Secondary | ICD-10-CM | POA: Diagnosis not present

## 2023-03-23 DIAGNOSIS — Z794 Long term (current) use of insulin: Secondary | ICD-10-CM | POA: Diagnosis not present

## 2023-03-23 DIAGNOSIS — E785 Hyperlipidemia, unspecified: Secondary | ICD-10-CM | POA: Diagnosis not present

## 2023-03-23 DIAGNOSIS — R2681 Unsteadiness on feet: Secondary | ICD-10-CM | POA: Diagnosis not present

## 2023-03-23 DIAGNOSIS — Z1331 Encounter for screening for depression: Secondary | ICD-10-CM | POA: Diagnosis not present

## 2023-03-23 DIAGNOSIS — Z008 Encounter for other general examination: Secondary | ICD-10-CM | POA: Diagnosis not present

## 2023-03-23 DIAGNOSIS — E1142 Type 2 diabetes mellitus with diabetic polyneuropathy: Secondary | ICD-10-CM | POA: Diagnosis not present

## 2023-03-23 DIAGNOSIS — Z7901 Long term (current) use of anticoagulants: Secondary | ICD-10-CM | POA: Diagnosis not present

## 2023-03-23 DIAGNOSIS — E113299 Type 2 diabetes mellitus with mild nonproliferative diabetic retinopathy without macular edema, unspecified eye: Secondary | ICD-10-CM | POA: Diagnosis not present

## 2023-05-07 DIAGNOSIS — Z96651 Presence of right artificial knee joint: Secondary | ICD-10-CM | POA: Diagnosis not present

## 2023-05-07 DIAGNOSIS — M1712 Unilateral primary osteoarthritis, left knee: Secondary | ICD-10-CM | POA: Diagnosis not present

## 2023-06-02 ENCOUNTER — Other Ambulatory Visit (HOSPITAL_COMMUNITY): Payer: Self-pay | Admitting: *Deleted

## 2023-06-05 ENCOUNTER — Ambulatory Visit (HOSPITAL_COMMUNITY)
Admission: RE | Admit: 2023-06-05 | Discharge: 2023-06-05 | Disposition: A | Discharge status: Home / Self Care | Payer: PPO | Source: Ambulatory Visit | Attending: Endocrinology | Admitting: Endocrinology

## 2023-06-05 DIAGNOSIS — M810 Age-related osteoporosis without current pathological fracture: Secondary | ICD-10-CM | POA: Diagnosis not present

## 2023-06-05 MED ORDER — DENOSUMAB 60 MG/ML ~~LOC~~ SOSY
PREFILLED_SYRINGE | SUBCUTANEOUS | Status: AC
  Filled 2023-06-05: qty 1

## 2023-06-05 MED ORDER — DENOSUMAB 60 MG/ML ~~LOC~~ SOSY
60.0000 mg | PREFILLED_SYRINGE | Freq: Once | SUBCUTANEOUS | Status: AC
  Administered 2023-06-05: 60 mg via SUBCUTANEOUS

## 2023-06-25 DIAGNOSIS — M1712 Unilateral primary osteoarthritis, left knee: Secondary | ICD-10-CM | POA: Diagnosis not present

## 2023-07-02 DIAGNOSIS — M1712 Unilateral primary osteoarthritis, left knee: Secondary | ICD-10-CM | POA: Diagnosis not present

## 2023-07-08 DIAGNOSIS — M1712 Unilateral primary osteoarthritis, left knee: Secondary | ICD-10-CM | POA: Diagnosis not present

## 2023-07-31 DIAGNOSIS — Z794 Long term (current) use of insulin: Secondary | ICD-10-CM | POA: Diagnosis not present

## 2023-07-31 DIAGNOSIS — I10 Essential (primary) hypertension: Secondary | ICD-10-CM | POA: Diagnosis not present

## 2023-07-31 DIAGNOSIS — E113299 Type 2 diabetes mellitus with mild nonproliferative diabetic retinopathy without macular edema, unspecified eye: Secondary | ICD-10-CM | POA: Diagnosis not present

## 2023-07-31 DIAGNOSIS — E049 Nontoxic goiter, unspecified: Secondary | ICD-10-CM | POA: Diagnosis not present

## 2023-07-31 DIAGNOSIS — I480 Paroxysmal atrial fibrillation: Secondary | ICD-10-CM | POA: Diagnosis not present

## 2023-07-31 DIAGNOSIS — R2681 Unsteadiness on feet: Secondary | ICD-10-CM | POA: Diagnosis not present

## 2023-07-31 DIAGNOSIS — E785 Hyperlipidemia, unspecified: Secondary | ICD-10-CM | POA: Diagnosis not present

## 2023-07-31 DIAGNOSIS — M810 Age-related osteoporosis without current pathological fracture: Secondary | ICD-10-CM | POA: Diagnosis not present

## 2023-07-31 DIAGNOSIS — E1142 Type 2 diabetes mellitus with diabetic polyneuropathy: Secondary | ICD-10-CM | POA: Diagnosis not present

## 2023-07-31 DIAGNOSIS — Z7901 Long term (current) use of anticoagulants: Secondary | ICD-10-CM | POA: Diagnosis not present

## 2023-07-31 DIAGNOSIS — L409 Psoriasis, unspecified: Secondary | ICD-10-CM | POA: Diagnosis not present

## 2023-09-30 DIAGNOSIS — Z23 Encounter for immunization: Secondary | ICD-10-CM | POA: Diagnosis not present

## 2023-11-20 DIAGNOSIS — E785 Hyperlipidemia, unspecified: Secondary | ICD-10-CM | POA: Diagnosis not present

## 2023-11-20 DIAGNOSIS — R2681 Unsteadiness on feet: Secondary | ICD-10-CM | POA: Diagnosis not present

## 2023-11-20 DIAGNOSIS — M810 Age-related osteoporosis without current pathological fracture: Secondary | ICD-10-CM | POA: Diagnosis not present

## 2023-11-20 DIAGNOSIS — E113299 Type 2 diabetes mellitus with mild nonproliferative diabetic retinopathy without macular edema, unspecified eye: Secondary | ICD-10-CM | POA: Diagnosis not present

## 2023-11-20 DIAGNOSIS — E1142 Type 2 diabetes mellitus with diabetic polyneuropathy: Secondary | ICD-10-CM | POA: Diagnosis not present

## 2023-11-20 DIAGNOSIS — Z794 Long term (current) use of insulin: Secondary | ICD-10-CM | POA: Diagnosis not present

## 2023-11-20 DIAGNOSIS — I10 Essential (primary) hypertension: Secondary | ICD-10-CM | POA: Diagnosis not present

## 2023-11-20 DIAGNOSIS — I480 Paroxysmal atrial fibrillation: Secondary | ICD-10-CM | POA: Diagnosis not present

## 2023-11-20 DIAGNOSIS — E049 Nontoxic goiter, unspecified: Secondary | ICD-10-CM | POA: Diagnosis not present

## 2023-11-20 DIAGNOSIS — Z7901 Long term (current) use of anticoagulants: Secondary | ICD-10-CM | POA: Diagnosis not present

## 2023-11-20 DIAGNOSIS — E559 Vitamin D deficiency, unspecified: Secondary | ICD-10-CM | POA: Diagnosis not present

## 2023-11-25 DIAGNOSIS — H04123 Dry eye syndrome of bilateral lacrimal glands: Secondary | ICD-10-CM | POA: Diagnosis not present

## 2023-11-25 DIAGNOSIS — H353133 Nonexudative age-related macular degeneration, bilateral, advanced atrophic without subfoveal involvement: Secondary | ICD-10-CM | POA: Diagnosis not present

## 2023-11-25 DIAGNOSIS — H40013 Open angle with borderline findings, low risk, bilateral: Secondary | ICD-10-CM | POA: Diagnosis not present

## 2023-11-25 DIAGNOSIS — E113393 Type 2 diabetes mellitus with moderate nonproliferative diabetic retinopathy without macular edema, bilateral: Secondary | ICD-10-CM | POA: Diagnosis not present

## 2023-12-03 ENCOUNTER — Other Ambulatory Visit (HOSPITAL_COMMUNITY): Payer: Self-pay | Admitting: *Deleted

## 2023-12-07 ENCOUNTER — Ambulatory Visit (HOSPITAL_COMMUNITY)
Admission: RE | Admit: 2023-12-07 | Discharge: 2023-12-07 | Disposition: A | Discharge status: Home / Self Care | Payer: PPO | Source: Ambulatory Visit | Attending: Endocrinology | Admitting: Endocrinology

## 2023-12-07 DIAGNOSIS — M810 Age-related osteoporosis without current pathological fracture: Secondary | ICD-10-CM | POA: Insufficient documentation

## 2023-12-07 MED ORDER — DENOSUMAB 60 MG/ML ~~LOC~~ SOSY
PREFILLED_SYRINGE | SUBCUTANEOUS | Status: AC
  Filled 2023-12-07: qty 1

## 2023-12-07 MED ORDER — DENOSUMAB 60 MG/ML ~~LOC~~ SOSY
60.0000 mg | PREFILLED_SYRINGE | Freq: Once | SUBCUTANEOUS | Status: AC
  Administered 2023-12-07: 60 mg via SUBCUTANEOUS

## 2024-02-02 DIAGNOSIS — E785 Hyperlipidemia, unspecified: Secondary | ICD-10-CM | POA: Diagnosis not present

## 2024-02-02 DIAGNOSIS — I10 Essential (primary) hypertension: Secondary | ICD-10-CM | POA: Diagnosis not present

## 2024-02-02 DIAGNOSIS — E1142 Type 2 diabetes mellitus with diabetic polyneuropathy: Secondary | ICD-10-CM | POA: Diagnosis not present

## 2024-02-18 DIAGNOSIS — M1712 Unilateral primary osteoarthritis, left knee: Secondary | ICD-10-CM | POA: Diagnosis not present

## 2024-02-25 DIAGNOSIS — M1712 Unilateral primary osteoarthritis, left knee: Secondary | ICD-10-CM | POA: Diagnosis not present

## 2024-03-03 DIAGNOSIS — M1712 Unilateral primary osteoarthritis, left knee: Secondary | ICD-10-CM | POA: Diagnosis not present

## 2024-03-03 DIAGNOSIS — D485 Neoplasm of uncertain behavior of skin: Secondary | ICD-10-CM | POA: Diagnosis not present

## 2024-03-21 DIAGNOSIS — E113299 Type 2 diabetes mellitus with mild nonproliferative diabetic retinopathy without macular edema, unspecified eye: Secondary | ICD-10-CM | POA: Diagnosis not present

## 2024-03-21 DIAGNOSIS — E785 Hyperlipidemia, unspecified: Secondary | ICD-10-CM | POA: Diagnosis not present

## 2024-03-21 DIAGNOSIS — Z1212 Encounter for screening for malignant neoplasm of rectum: Secondary | ICD-10-CM | POA: Diagnosis not present

## 2024-03-21 DIAGNOSIS — M810 Age-related osteoporosis without current pathological fracture: Secondary | ICD-10-CM | POA: Diagnosis not present

## 2024-03-21 DIAGNOSIS — I10 Essential (primary) hypertension: Secondary | ICD-10-CM | POA: Diagnosis not present

## 2024-03-21 DIAGNOSIS — E559 Vitamin D deficiency, unspecified: Secondary | ICD-10-CM | POA: Diagnosis not present

## 2024-03-21 DIAGNOSIS — E049 Nontoxic goiter, unspecified: Secondary | ICD-10-CM | POA: Diagnosis not present

## 2024-03-28 DIAGNOSIS — R2681 Unsteadiness on feet: Secondary | ICD-10-CM | POA: Diagnosis not present

## 2024-03-28 DIAGNOSIS — E113299 Type 2 diabetes mellitus with mild nonproliferative diabetic retinopathy without macular edema, unspecified eye: Secondary | ICD-10-CM | POA: Diagnosis not present

## 2024-03-28 DIAGNOSIS — E559 Vitamin D deficiency, unspecified: Secondary | ICD-10-CM | POA: Diagnosis not present

## 2024-03-28 DIAGNOSIS — Z0000 Encounter for general adult medical examination without abnormal findings: Secondary | ICD-10-CM | POA: Diagnosis not present

## 2024-03-28 DIAGNOSIS — I480 Paroxysmal atrial fibrillation: Secondary | ICD-10-CM | POA: Diagnosis not present

## 2024-03-28 DIAGNOSIS — E049 Nontoxic goiter, unspecified: Secondary | ICD-10-CM | POA: Diagnosis not present

## 2024-03-28 DIAGNOSIS — E1142 Type 2 diabetes mellitus with diabetic polyneuropathy: Secondary | ICD-10-CM | POA: Diagnosis not present

## 2024-03-28 DIAGNOSIS — Z794 Long term (current) use of insulin: Secondary | ICD-10-CM | POA: Diagnosis not present

## 2024-03-28 DIAGNOSIS — Z7901 Long term (current) use of anticoagulants: Secondary | ICD-10-CM | POA: Diagnosis not present

## 2024-03-28 DIAGNOSIS — E785 Hyperlipidemia, unspecified: Secondary | ICD-10-CM | POA: Diagnosis not present

## 2024-03-28 DIAGNOSIS — R82998 Other abnormal findings in urine: Secondary | ICD-10-CM | POA: Diagnosis not present

## 2024-03-28 DIAGNOSIS — L409 Psoriasis, unspecified: Secondary | ICD-10-CM | POA: Diagnosis not present

## 2024-03-28 DIAGNOSIS — K8020 Calculus of gallbladder without cholecystitis without obstruction: Secondary | ICD-10-CM | POA: Diagnosis not present

## 2024-03-28 DIAGNOSIS — Z1331 Encounter for screening for depression: Secondary | ICD-10-CM | POA: Diagnosis not present

## 2024-03-28 DIAGNOSIS — I10 Essential (primary) hypertension: Secondary | ICD-10-CM | POA: Diagnosis not present

## 2024-03-28 DIAGNOSIS — Z1339 Encounter for screening examination for other mental health and behavioral disorders: Secondary | ICD-10-CM | POA: Diagnosis not present

## 2024-03-28 DIAGNOSIS — M810 Age-related osteoporosis without current pathological fracture: Secondary | ICD-10-CM | POA: Diagnosis not present

## 2024-06-01 DIAGNOSIS — L905 Scar conditions and fibrosis of skin: Secondary | ICD-10-CM | POA: Diagnosis not present

## 2024-06-01 DIAGNOSIS — L281 Prurigo nodularis: Secondary | ICD-10-CM | POA: Diagnosis not present

## 2024-06-02 ENCOUNTER — Other Ambulatory Visit (HOSPITAL_COMMUNITY): Payer: Self-pay | Admitting: *Deleted

## 2024-06-03 ENCOUNTER — Ambulatory Visit (HOSPITAL_COMMUNITY)
Admission: RE | Admit: 2024-06-03 | Discharge: 2024-06-03 | Disposition: A | Discharge status: Home / Self Care | Source: Ambulatory Visit | Attending: Endocrinology | Admitting: Endocrinology

## 2024-06-03 DIAGNOSIS — M810 Age-related osteoporosis without current pathological fracture: Secondary | ICD-10-CM | POA: Insufficient documentation

## 2024-06-03 MED ORDER — DENOSUMAB 60 MG/ML ~~LOC~~ SOSY
PREFILLED_SYRINGE | SUBCUTANEOUS | Status: AC
  Filled 2024-06-03: qty 1

## 2024-06-03 MED ORDER — DENOSUMAB 60 MG/ML ~~LOC~~ SOSY
60.0000 mg | PREFILLED_SYRINGE | Freq: Once | SUBCUTANEOUS | Status: AC
  Administered 2024-06-03: 60 mg via SUBCUTANEOUS

## 2024-07-18 DIAGNOSIS — E113299 Type 2 diabetes mellitus with mild nonproliferative diabetic retinopathy without macular edema, unspecified eye: Secondary | ICD-10-CM | POA: Diagnosis not present

## 2024-07-18 DIAGNOSIS — I10 Essential (primary) hypertension: Secondary | ICD-10-CM | POA: Diagnosis not present

## 2024-07-18 DIAGNOSIS — Z794 Long term (current) use of insulin: Secondary | ICD-10-CM | POA: Diagnosis not present

## 2024-07-18 DIAGNOSIS — Z7901 Long term (current) use of anticoagulants: Secondary | ICD-10-CM | POA: Diagnosis not present

## 2024-07-18 DIAGNOSIS — Z6838 Body mass index (BMI) 38.0-38.9, adult: Secondary | ICD-10-CM | POA: Diagnosis not present

## 2024-07-18 DIAGNOSIS — E049 Nontoxic goiter, unspecified: Secondary | ICD-10-CM | POA: Diagnosis not present

## 2024-07-18 DIAGNOSIS — I480 Paroxysmal atrial fibrillation: Secondary | ICD-10-CM | POA: Diagnosis not present

## 2024-07-18 DIAGNOSIS — M810 Age-related osteoporosis without current pathological fracture: Secondary | ICD-10-CM | POA: Diagnosis not present

## 2024-07-18 DIAGNOSIS — E1142 Type 2 diabetes mellitus with diabetic polyneuropathy: Secondary | ICD-10-CM | POA: Diagnosis not present

## 2024-07-18 DIAGNOSIS — E785 Hyperlipidemia, unspecified: Secondary | ICD-10-CM | POA: Diagnosis not present

## 2024-07-18 DIAGNOSIS — L409 Psoriasis, unspecified: Secondary | ICD-10-CM | POA: Diagnosis not present

## 2024-08-26 DIAGNOSIS — E113291 Type 2 diabetes mellitus with mild nonproliferative diabetic retinopathy without macular edema, right eye: Secondary | ICD-10-CM | POA: Diagnosis not present

## 2024-08-26 DIAGNOSIS — H52203 Unspecified astigmatism, bilateral: Secondary | ICD-10-CM | POA: Diagnosis not present

## 2024-08-26 DIAGNOSIS — H40013 Open angle with borderline findings, low risk, bilateral: Secondary | ICD-10-CM | POA: Diagnosis not present

## 2024-08-26 DIAGNOSIS — H353124 Nonexudative age-related macular degeneration, left eye, advanced atrophic with subfoveal involvement: Secondary | ICD-10-CM | POA: Diagnosis not present

## 2024-08-26 DIAGNOSIS — H04123 Dry eye syndrome of bilateral lacrimal glands: Secondary | ICD-10-CM | POA: Diagnosis not present

## 2024-08-26 DIAGNOSIS — H524 Presbyopia: Secondary | ICD-10-CM | POA: Diagnosis not present

## 2024-09-09 ENCOUNTER — Other Ambulatory Visit (HOSPITAL_COMMUNITY): Payer: Self-pay | Admitting: Endocrinology

## 2024-09-09 DIAGNOSIS — M810 Age-related osteoporosis without current pathological fracture: Secondary | ICD-10-CM | POA: Insufficient documentation

## 2024-09-13 DIAGNOSIS — M1712 Unilateral primary osteoarthritis, left knee: Secondary | ICD-10-CM | POA: Diagnosis not present

## 2024-09-19 ENCOUNTER — Telehealth (HOSPITAL_COMMUNITY): Payer: Self-pay | Admitting: Pharmacy Technician

## 2024-09-19 NOTE — Telephone Encounter (Signed)
 Auth Submission: NO AUTH NEEDED Site of care: [facility] Payer: [employer] Medication & CPT/J Code(s) submitted: Prolia  (Denosumab ) [id number] Diagnosis Code: M81.0 Route of submission (phone, fax, portal):  Phone # Fax # Auth type: Buy/Bill HB Units/visits requested: 60mg  x 2 doses, q 6 months Reference number:  Approval from: 12/09/23 to 12/07/24    Kelly [name], CPhT [facility] Phone: [phone] 09/19/2024

## 2024-12-06 ENCOUNTER — Ambulatory Visit (HOSPITAL_COMMUNITY)
Admission: RE | Admit: 2024-12-06 | Discharge: 2024-12-06 | Disposition: A | Discharge status: Home / Self Care | Source: Ambulatory Visit | Attending: Endocrinology | Admitting: Endocrinology

## 2024-12-06 VITALS — BP 156/68 | HR 87 | Temp 97.3°F | Resp 16

## 2024-12-06 DIAGNOSIS — M810 Age-related osteoporosis without current pathological fracture: Secondary | ICD-10-CM | POA: Diagnosis present

## 2024-12-06 MED ORDER — DENOSUMAB 60 MG/ML ~~LOC~~ SOSY
PREFILLED_SYRINGE | SUBCUTANEOUS | Status: AC
  Filled 2024-12-06: qty 1

## 2024-12-06 MED ORDER — DENOSUMAB 60 MG/ML ~~LOC~~ SOSY
60.0000 mg | PREFILLED_SYRINGE | Freq: Once | SUBCUTANEOUS | Status: AC
  Administered 2024-12-06: 60 mg via SUBCUTANEOUS

## 2025-06-07 ENCOUNTER — Encounter (HOSPITAL_COMMUNITY)
